# Patient Record
Sex: Male | Born: 1965 | Race: Black or African American | Hispanic: No | Marital: Married | Smoking: Former smoker
Health system: Southern US, Community
[De-identification: ages and names within clinical notes are randomized; demographics above are authoritative.]

## PROBLEM LIST (undated history)

## (undated) DIAGNOSIS — B3781 Candidal esophagitis: Secondary | ICD-10-CM

## (undated) DIAGNOSIS — G4730 Sleep apnea, unspecified: Secondary | ICD-10-CM

## (undated) DIAGNOSIS — K7460 Unspecified cirrhosis of liver: Secondary | ICD-10-CM

## (undated) DIAGNOSIS — E119 Type 2 diabetes mellitus without complications: Secondary | ICD-10-CM

## (undated) DIAGNOSIS — K219 Gastro-esophageal reflux disease without esophagitis: Secondary | ICD-10-CM

## (undated) DIAGNOSIS — Z973 Presence of spectacles and contact lenses: Secondary | ICD-10-CM

## (undated) DIAGNOSIS — I10 Essential (primary) hypertension: Secondary | ICD-10-CM

## (undated) DIAGNOSIS — T8859XA Other complications of anesthesia, initial encounter: Secondary | ICD-10-CM

## (undated) DIAGNOSIS — D696 Thrombocytopenia, unspecified: Secondary | ICD-10-CM

## (undated) DIAGNOSIS — K635 Polyp of colon: Secondary | ICD-10-CM

## (undated) HISTORY — DX: Candidal esophagitis: B37.81

## (undated) HISTORY — DX: Unspecified cirrhosis of liver: K74.60

## (undated) HISTORY — PX: BACK SURGERY: SHX140

## (undated) HISTORY — DX: Thrombocytopenia, unspecified: D69.6

## (undated) HISTORY — DX: Polyp of colon: K63.5

## (undated) HISTORY — PX: APPENDECTOMY: SHX54

## (undated) HISTORY — PX: COLONOSCOPY: SHX174

---

## 2000-10-25 ENCOUNTER — Emergency Department (HOSPITAL_COMMUNITY): Admission: EM | Admit: 2000-10-25 | Discharge: 2000-10-25 | Payer: Self-pay | Admitting: Emergency Medicine

## 2000-10-25 ENCOUNTER — Encounter: Payer: Self-pay | Admitting: Emergency Medicine

## 2000-11-03 ENCOUNTER — Encounter (HOSPITAL_COMMUNITY): Admission: RE | Admit: 2000-11-03 | Discharge: 2000-12-03 | Payer: Self-pay | Admitting: Internal Medicine

## 2000-11-17 ENCOUNTER — Ambulatory Visit (HOSPITAL_COMMUNITY): Admission: RE | Admit: 2000-11-17 | Discharge: 2000-11-17 | Payer: Self-pay | Admitting: General Surgery

## 2000-11-17 ENCOUNTER — Encounter: Payer: Self-pay | Admitting: General Surgery

## 2000-12-08 ENCOUNTER — Encounter (HOSPITAL_COMMUNITY): Admission: RE | Admit: 2000-12-08 | Discharge: 2001-01-07 | Payer: Self-pay | Admitting: Internal Medicine

## 2008-08-13 ENCOUNTER — Encounter (INDEPENDENT_AMBULATORY_CARE_PROVIDER_SITE_OTHER): Payer: Self-pay | Admitting: Orthopedic Surgery

## 2008-08-13 ENCOUNTER — Inpatient Hospital Stay (HOSPITAL_COMMUNITY): Admission: RE | Admit: 2008-08-13 | Discharge: 2008-08-15 | Payer: Self-pay | Admitting: Orthopedic Surgery

## 2009-09-26 ENCOUNTER — Emergency Department (HOSPITAL_COMMUNITY): Admission: EM | Admit: 2009-09-26 | Discharge: 2009-09-26 | Payer: Self-pay | Admitting: Emergency Medicine

## 2010-05-15 IMAGING — CR DG CHEST 2V
2 series · 2 of 2 positions shown · non-contrast
Comparison: None

CLINICAL DATA: Preop for surgery for ruptured lumbar disc

CHEST - 2 VIEW

[view not recorded (1 of 2)]
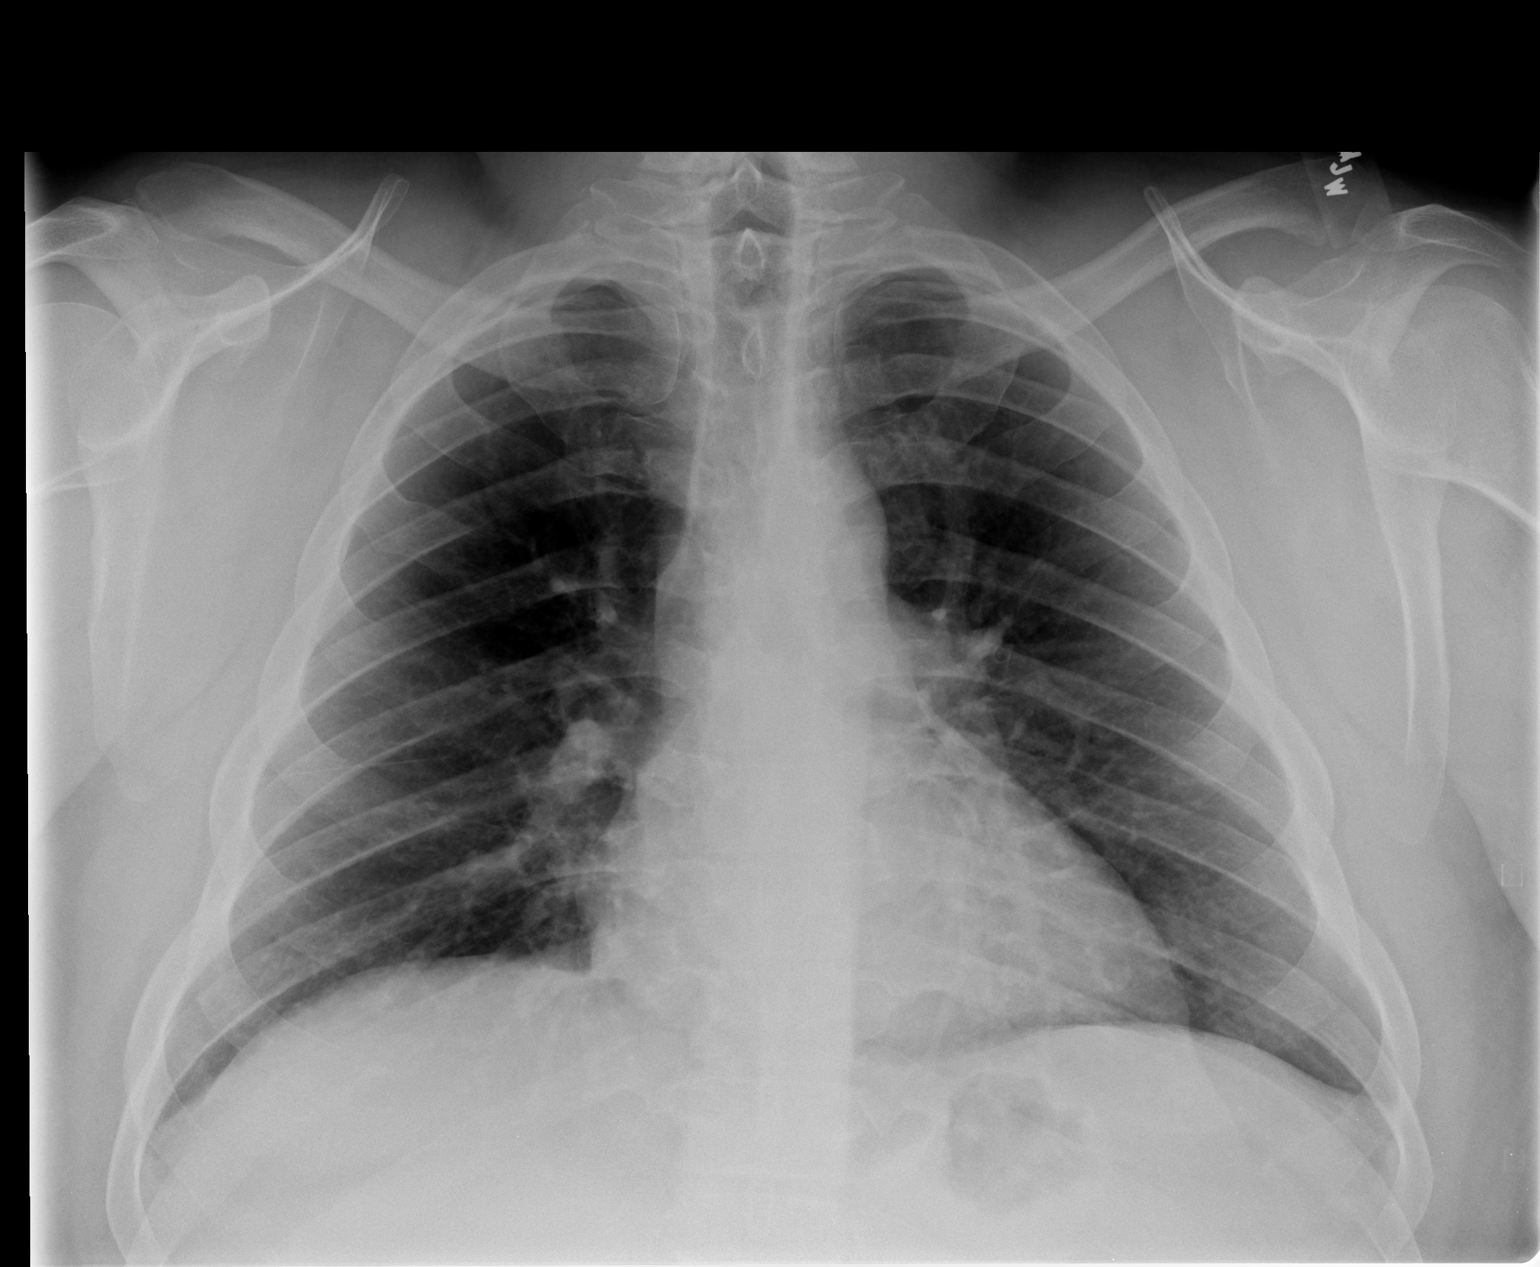

[view not recorded (2 of 2)]
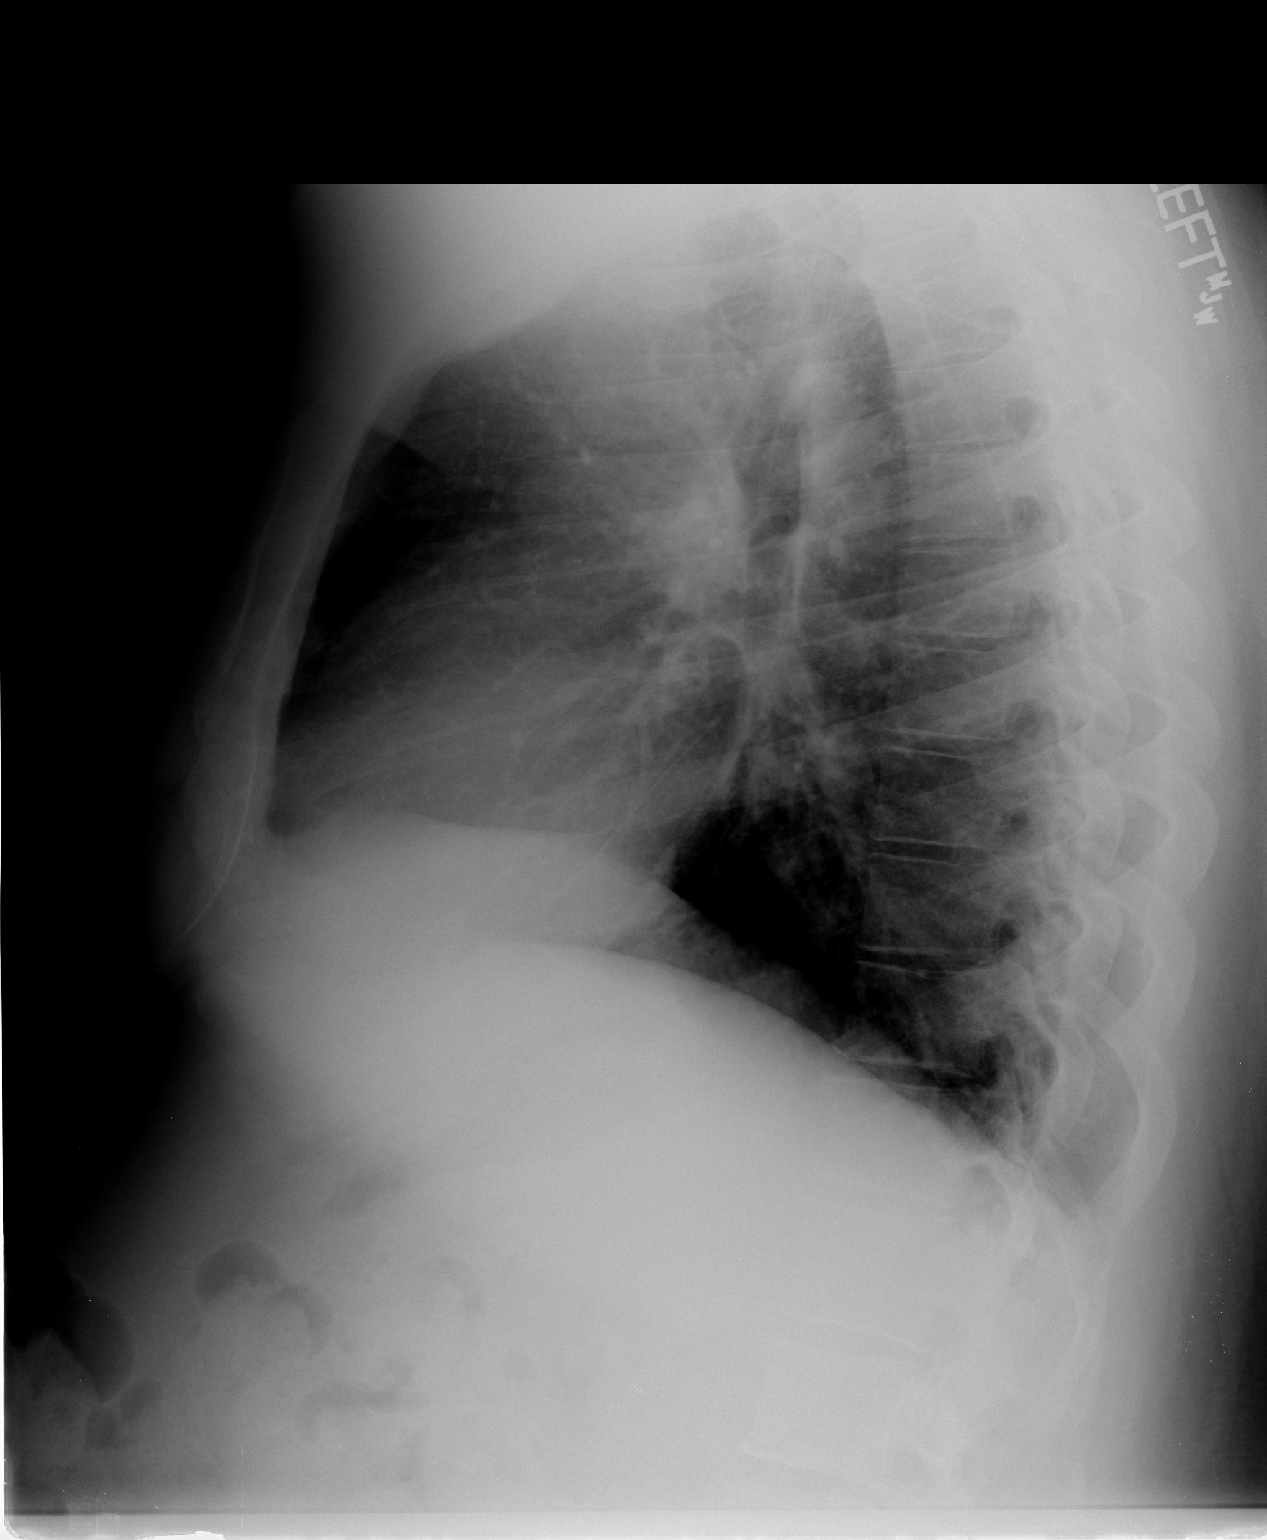

[2 of 2 positions shown; findings below may reference images not displayed]

FINDINGS: The lungs are clear. There is some peribronchial
thickening present.  The heart is borderline enlarged. No bony
abnormality is seen.
IMPRESSION: No active lung disease. Mild peribronchial thickening.

## 2010-07-20 LAB — URINALYSIS, ROUTINE W REFLEX MICROSCOPIC
Glucose, UA: >1000 mg/dL — AB
Hgb urine dipstick: NEGATIVE
Ketones, ur: 15 mg/dL — AB
Leukocytes, UA: NEGATIVE
Nitrite: NEGATIVE
Protein, ur: 30 mg/dL — AB
Specific Gravity, Urine: 1.041 — ABNORMAL HIGH (ref 1.005–1.030)
Urobilinogen, UA: 1 mg/dL (ref 0.0–1.0)
pH: 6 (ref 5.0–8.0)

## 2010-07-20 LAB — POCT I-STAT, CHEM 8
BUN: 9 mg/dL (ref 6–23)
Calcium, Ion: 1.24 mmol/L (ref 1.12–1.32)
Chloride: 104 mEq/L (ref 96–112)
Creatinine, Ser: 0.8 mg/dL (ref 0.4–1.5)
Glucose, Bld: 158 mg/dL — ABNORMAL HIGH (ref 70–99)
HCT: 39 % (ref 39.0–52.0)
Hemoglobin: 13.3 g/dL (ref 13.0–17.0)
Potassium: 4.7 mEq/L (ref 3.5–5.1)
Sodium: 140 mEq/L (ref 135–145)
TCO2: 27 mmol/L (ref 0–100)

## 2010-07-20 LAB — URINE MICROSCOPIC-ADD ON

## 2010-08-12 LAB — GLUCOSE, CAPILLARY
Glucose-Capillary: 130 mg/dL — ABNORMAL HIGH (ref 70–99)
Glucose-Capillary: 137 mg/dL — ABNORMAL HIGH (ref 70–99)
Glucose-Capillary: 156 mg/dL — ABNORMAL HIGH (ref 70–99)
Glucose-Capillary: 182 mg/dL — ABNORMAL HIGH (ref 70–99)
Glucose-Capillary: 185 mg/dL — ABNORMAL HIGH (ref 70–99)
Glucose-Capillary: 189 mg/dL — ABNORMAL HIGH (ref 70–99)
Glucose-Capillary: 192 mg/dL — ABNORMAL HIGH (ref 70–99)
Glucose-Capillary: 214 mg/dL — ABNORMAL HIGH (ref 70–99)
Glucose-Capillary: 242 mg/dL — ABNORMAL HIGH (ref 70–99)
Glucose-Capillary: 287 mg/dL — ABNORMAL HIGH (ref 70–99)

## 2010-08-12 LAB — COMPREHENSIVE METABOLIC PANEL
ALT: 27 U/L (ref 0–53)
AST: 34 U/L (ref 0–37)
Albumin: 3.7 g/dL (ref 3.5–5.2)
Alkaline Phosphatase: 46 U/L (ref 39–117)
BUN: 7 mg/dL (ref 6–23)
CO2: 26 mEq/L (ref 19–32)
Calcium: 10 mg/dL (ref 8.4–10.5)
Chloride: 103 mEq/L (ref 96–112)
Creatinine, Ser: 0.72 mg/dL (ref 0.4–1.5)
GFR calc Af Amer: >60 mL/min (ref >60)
GFR calc non Af Amer: >60 mL/min (ref >60)
Glucose, Bld: 145 mg/dL — ABNORMAL HIGH (ref 70–99)
Potassium: 4.1 mEq/L (ref 3.5–5.1)
Sodium: 137 mEq/L (ref 135–145)
Total Bilirubin: 1.2 mg/dL (ref 0.3–1.2)
Total Protein: 8.2 g/dL (ref 6.0–8.3)

## 2010-08-12 LAB — CBC
HCT: 33.3 % — ABNORMAL LOW (ref 39.0–52.0)
HCT: 41.4 % (ref 39.0–52.0)
Hemoglobin: 11.7 g/dL — ABNORMAL LOW (ref 13.0–17.0)
Hemoglobin: 14.3 g/dL (ref 13.0–17.0)
MCHC: 34.6 g/dL (ref 30.0–36.0)
MCHC: 35.1 g/dL (ref 30.0–36.0)
MCV: 91.2 fL (ref 78.0–100.0)
MCV: 92.4 fL (ref 78.0–100.0)
Platelets: 113 10*3/uL — ABNORMAL LOW (ref 150–400)
Platelets: 89 10*3/uL — ABNORMAL LOW (ref 150–400)
RBC: 3.65 MIL/uL — ABNORMAL LOW (ref 4.22–5.81)
RBC: 4.48 MIL/uL (ref 4.22–5.81)
RDW: 12.1 % (ref 11.5–15.5)
RDW: 12.7 % (ref 11.5–15.5)
WBC: 6.6 10*3/uL (ref 4.0–10.5)
WBC: 9.4 10*3/uL (ref 4.0–10.5)

## 2010-08-12 LAB — URINALYSIS, ROUTINE W REFLEX MICROSCOPIC
Bilirubin Urine: NEGATIVE
Glucose, UA: NEGATIVE mg/dL
Hgb urine dipstick: NEGATIVE
Ketones, ur: NEGATIVE mg/dL
Nitrite: NEGATIVE
Protein, ur: NEGATIVE mg/dL
Specific Gravity, Urine: 1.015 (ref 1.005–1.030)
Urobilinogen, UA: 2 mg/dL — ABNORMAL HIGH (ref 0.0–1.0)
pH: 6 (ref 5.0–8.0)

## 2010-08-12 LAB — HEMOGLOBIN A1C
Hgb A1c MFr Bld: 6.8 % — ABNORMAL HIGH (ref 4.6–6.1)
Mean Plasma Glucose: 148 mg/dL

## 2010-08-12 LAB — DIFFERENTIAL
Basophils Absolute: 0 10*3/uL (ref 0.0–0.1)
Basophils Relative: 0 % (ref 0–1)
Eosinophils Absolute: 0.3 10*3/uL (ref 0.0–0.7)
Eosinophils Relative: 4 % (ref 0–5)
Lymphocytes Relative: 15 % (ref 12–46)
Lymphs Abs: 1 10*3/uL (ref 0.7–4.0)
Monocytes Absolute: 0.6 10*3/uL (ref 0.1–1.0)
Monocytes Relative: 9 % (ref 3–12)
Neutro Abs: 4.8 10*3/uL (ref 1.7–7.7)
Neutrophils Relative %: 72 % (ref 43–77)

## 2010-08-12 LAB — BASIC METABOLIC PANEL
BUN: 10 mg/dL (ref 6–23)
CO2: 27 mEq/L (ref 19–32)
Calcium: 9.2 mg/dL (ref 8.4–10.5)
Chloride: 104 mEq/L (ref 96–112)
Creatinine, Ser: 1.03 mg/dL (ref 0.4–1.5)
GFR calc Af Amer: >60 mL/min (ref >60)
GFR calc non Af Amer: >60 mL/min (ref >60)
Glucose, Bld: 227 mg/dL — ABNORMAL HIGH (ref 70–99)
Potassium: 4.4 mEq/L (ref 3.5–5.1)
Sodium: 137 mEq/L (ref 135–145)

## 2010-08-12 LAB — VITAMIN D 25 HYDROXY (VIT D DEFICIENCY, FRACTURES): Vit D, 25-Hydroxy: 16 ng/mL — ABNORMAL LOW (ref 30–89)

## 2010-08-12 LAB — ABO/RH: ABO/RH(D): B POS

## 2010-08-12 LAB — TYPE AND SCREEN
ABO/RH(D): B POS
Antibody Screen: NEGATIVE

## 2010-08-12 LAB — PROTIME-INR
INR: 1.2 (ref 0.00–1.49)
Prothrombin Time: 15.2 seconds (ref 11.6–15.2)

## 2010-08-12 LAB — APTT: aPTT: 30 seconds (ref 24–37)

## 2010-09-15 NOTE — Discharge Summary (Signed)
Rivas, Jeffrey                 ACCOUNT NO.:  [id number]   MEDICAL RECORD NO.:  [id number]          PATIENT TYPE:  INP   LOCATION:  5015                         FACILITY:  [facility]   PHYSICIAN:  Nelda Severe, MD      DATE OF BIRTH:  Jul 12, 1965   DATE OF ADMISSION:  08/13/2008  DATE OF DISCHARGE:  08/15/2008                               DISCHARGE SUMMARY   DIAGNOSIS:  L5-S1 left-sided sciatica, disk herniation.   BRIEF HISTORY OF STAY:  The patient was brought in to [facility] on  August 13, 2008, by Dr. Nelda Severe for operative procedure of disk  excision of L5-S1 left side.  Postoperatively, the patient was stable.  There was minimal blood loss.  He was admitted to room 5015 and placed  on a sliding scale due to history of diabetes.  He was maintained on his  regular diabetic medication as well to include glyburide and Avandia.  Postop day 1, the patient was stable.  Drain was discontinued.  Dry  dressing was placed over the incision.  Hemoglobin 11.7 white blood  count 9.4, and glucose 227 treated with medication.  Distally  neurovascularly motor intact, lateral aspect and fifth toe on left foot  are numb and have been numb since preop.  We Hep-Locked his IV.  PT did  an evaluation for mobility.  On postop day 2, August 15, 2008, today, the  patient is stable, taking p.o.'s well, ambulating independently, and his  throat is sore, back is somewhat sore, but leg pain is better.   DIAGNOSIS:  Disk herniation, left side L5-S1 with sciatica.   PLAN:  The patient will be discharged home with care of his family.  We  are going to release him with Norco 10/325 one to two q.4 p.r.n. for  pain control, Robaxin 500 mg 1 q.6 p.r.n. for muscle spasms.  He is  going to follow up in our office and Dr. Nelda Severe in about 4 weeks  from surgery.  We want him to walk for exercise.  No bending, stooping,  or lifting.  Regular diet.   DISPOSITION:  Stable.      Lianne Cure,  P.A.      Nelda Severe, MD  Electronically Signed    MC/MEDQ  D:  08/15/2008  T:  08/16/2008  Job:  [id number]

## 2010-09-15 NOTE — H&P (Signed)
NAMELUTHER, [name]                 ACCOUNT NO.:  [id number]   MEDICAL RECORD NO.:  [id number]          PATIENT TYPE:  INP   LOCATION:  NA                           FACILITY:  [facility]   PHYSICIAN:  Lianne Cure, P.A.  DATE OF BIRTH:  01-Apr-1966   DATE OF ADMISSION:  DATE OF DISCHARGE:                              HISTORY & PHYSICAL   HISTORY OF PRESENT ILLNESS:  This is a 45 year old male who has a long-  standing history of lumbar pain, currently left leg pain with numbness  that spread to the toes.  He is a Emergency planning/management officer in [employer].  He has had  them at work because he cannot feel the bottom of his foot when he walks  or runs.   ALLERGIES:  He has no known drug allergies.   CURRENT MEDICATIONS:  1. Glyburide/metformin 5/100.  2. Norco 7.5/325, 6-7 daily.  3. Meloxicam 15 mg daily.  4. Lunesta p.r.n. for sleep q.h.s.  5. He also takes Avandia 4 mg p.o. daily.   PAST MEDICAL HISTORY:  1. Type 2 diabetes.  2. Appendectomy.   FAMILY HISTORY:  Diabetes, stroke, arthritis.   REVIEW OF SYSTEMS:  He reports no fever.  No chills.  No hemoptysis.  No  nausea, vomiting, or diarrhea.  He does have constipation.  No bowel or  bladder incontinence.   PHYSICAL EXAMINATION:  GENERAL:  He appears normocephalic.  EYES:  Pupils are equal, round, and reactive to light.  CHEST:  Clear to auscultation.  NECK:  Full active range of motion of the neck without pain.  HEART:  Regular rate and rhythm.  No murmur.  ABDOMEN:  Positive bowel sounds.  Soft, nontender to palpation.  EXTREMITIES:  He has 5-/5 on left gluteus maximus musculus with  decreased sensation, toes decreased sensation on the left.  He does have  a slight left antalgic gait.  SKIN:  Clean, dry.  No rashes noted at the incision site.   LABORATORY DATA:  MRI shows left-sided L5-S1 disk herniation.   PLAN:  Diskectomy, left-sided L5, S1.  Possible bilateral diskectomy at  L5, S1.      Lianne Cure, P.A.     MC/MEDQ  D:   08/09/2008  T:  08/10/2008  Job:  [id number]

## 2010-09-15 NOTE — Op Note (Signed)
[name], MCCONATHY NO.:  [id number]   MEDICAL RECORD NO.:  [id number]          PATIENT TYPE:  INP   LOCATION:  5015                         FACILITY:  [facility]   PHYSICIAN:  Nelda Severe, MD      DATE OF BIRTH:  Oct 05, 1965   DATE OF PROCEDURE:  08/13/2008  DATE OF DISCHARGE:                               OPERATIVE REPORT   SURGEON:  Nelda Severe, MD   ASSISTANT:  Lianne Cure, PA-C   PREOPERATIVE DIAGNOSES:  1. L5-S1 central and left-sided disk protrusion with left sciatic      pain.  2. Obesity.   POSTOPERATIVE DIAGNOSIS:  1. L5-S1 central and left-sided disk protrusion with left sciatic      pain.  2. Obesity.   OPERATIVE PROCEDURES:  1. Left L5-S1 laminotomy.  2. Disk excision L5-S1.   OPERATIVE NOTE:  The patient was placed under general endotracheal  anesthesia.  Prophylactic antibiotics were administered intravenously.  He was positioned prone on a Jackson frame.  Upper extremities were  positioned with care to avoid hyperflexion and abduction of the  shoulders and so as to avoid hyperflexion of the elbows.  The upper  extremities were padded with foam from axilla to hands.  The thighs,  knees, shins, ankles, and feet were supported on pillows.   A skin marker was used to mark the proposed site of the lumbosacral  incision and just to the right of midline.  Lumbar area was prepped with  DuraPrep and draped square.  The drapes were secured with Ioban.   At this point, a time-out was held.  The identity of the patient, the  preoperative diagnosis, and the intended procedure were confirmed as  well as the other parameters covered in the checklist.   The skin was scored in line with the skin marking.  The subcutaneous  layer was injected with a mixture of 0.25% plain Marcaine and 1%  lidocaine with epinephrine.  The incision was carried down through the  dermis and a thick layer of adipose tissue, about 2 inches, to the  thoracolumbar  fascia.  The thoracolumbar fascia was incised vertically  just to the left of midline.  The sacrospinalis muscle was then elevated  off the spinous process and lamina at L5-S1.  A cross-table lateral  radiograph was taken with a Kocher on the trailing edge of L5 and this  confirmed the location.   A Taylor retractor was placed lateral to the L5-S1 facet joint, left.  I  then used a high-speed cutting bur to perform a medial one-quarter  facetectomy at L5-S1 left and to remove some of the lamina more  proximally.  We then detached the ligamentum flavum from the upper edge  of the sacral lamina and started our laminectomy at this point.  The  lateral recess was decompressed.  Ligamentum flavum was ultimately  removed with a large Kerrison rongeur from distal to proximal.  The S1  nerve root was easily identified.  There was a hard knuckle of disk  protruding posteriorly and placing the root under significant tension.  I placed a cannula in the disk lateral to the disk protrusion and  inserted the Hydrocision nozzle into the disk space and enucleated the  disk without difficulty.  Cannula was then withdrawn.  None of the  protrusion had been sucked back into the disk space.  The S1 nerve root  origin was mobilized and retracted medially and held with a Love nerve  root retractor.  I then incised the protrusion through dense gritty  scarred annulus and underlying nucleus.  Removal of the disk protrusion  was extremely arduous, as it appeared to have been longstanding and very  scarred.  Towards the end of the decompression, I did manage to hook up  2 moderately-sized fragments of extruded nucleus pulposus from the canal  proximally.   The posterior longitudinal ligament, outer annulus, and underlying  scarred nucleus pulposus all had to be removed sharply by means of  curettes, #15 blade, Kerrison rongeurs, pituitary rongeurs, etc.  The  automated system did a very good job at enucleating  the disk.   At this point, the nerve appeared well decompressed.  We watched at the  disk space, and there were no remaining fragments.  I probed the floor  of the canal with a nerve hook and could not come up with any more free  fragments.  After further irrigation of wound, closure was effected over  a 50-gauge Blake drain brought out through the skin to the right side  and secured with a 2-0 nylon suture.  The thoracolumbar fascia was  closed using interrupted figure-of-eight #1 Vicryl sutures.  The  subcutaneous layer was closed using interrupted 2-0 inverted Vicryl  sutures, and the skin was closed with a running subcuticular 3-0 undyed  Vicryl suture.  The skin edges were reinforced with Steri-Strips.  Antibiotic ointment dressing was applied and secured with OpSite.   The blood loss was less than 100 mL.  There were no intraoperative  complications.  The patient is currently being awakened and subsequently  he will be transferred to the recovery room and has not yet been  examined awake.  However, he was stable throughout the procedure.  Sponge and needle counts were correct.      Nelda Severe, MD  Electronically Signed     MT/MEDQ  D:  08/13/2008  T:  08/14/2008  Job:  [id number]

## 2012-06-22 ENCOUNTER — Emergency Department (HOSPITAL_COMMUNITY): Payer: BC Managed Care – PPO

## 2012-06-22 ENCOUNTER — Encounter (HOSPITAL_COMMUNITY): Payer: Self-pay

## 2012-06-22 ENCOUNTER — Observation Stay (HOSPITAL_COMMUNITY)
Admission: EM | Admit: 2012-06-22 | Discharge: 2012-06-23 | Disposition: A | Discharge status: Home / Self Care | Payer: BC Managed Care – PPO | Attending: Internal Medicine | Admitting: Internal Medicine

## 2012-06-22 DIAGNOSIS — I152 Hypertension secondary to endocrine disorders: Secondary | ICD-10-CM | POA: Insufficient documentation

## 2012-06-22 DIAGNOSIS — M549 Dorsalgia, unspecified: Secondary | ICD-10-CM | POA: Insufficient documentation

## 2012-06-22 DIAGNOSIS — E1165 Type 2 diabetes mellitus with hyperglycemia: Secondary | ICD-10-CM

## 2012-06-22 DIAGNOSIS — G8929 Other chronic pain: Secondary | ICD-10-CM | POA: Insufficient documentation

## 2012-06-22 DIAGNOSIS — A059 Bacterial foodborne intoxication, unspecified: Principal | ICD-10-CM | POA: Insufficient documentation

## 2012-06-22 DIAGNOSIS — D72829 Elevated white blood cell count, unspecified: Secondary | ICD-10-CM | POA: Diagnosis present

## 2012-06-22 DIAGNOSIS — E1142 Type 2 diabetes mellitus with diabetic polyneuropathy: Secondary | ICD-10-CM

## 2012-06-22 DIAGNOSIS — R112 Nausea with vomiting, unspecified: Secondary | ICD-10-CM | POA: Diagnosis present

## 2012-06-22 DIAGNOSIS — E1159 Type 2 diabetes mellitus with other circulatory complications: Secondary | ICD-10-CM | POA: Insufficient documentation

## 2012-06-22 DIAGNOSIS — R109 Unspecified abdominal pain: Secondary | ICD-10-CM

## 2012-06-22 DIAGNOSIS — E1140 Type 2 diabetes mellitus with diabetic neuropathy, unspecified: Secondary | ICD-10-CM | POA: Diagnosis present

## 2012-06-22 DIAGNOSIS — E1149 Type 2 diabetes mellitus with other diabetic neurological complication: Secondary | ICD-10-CM | POA: Insufficient documentation

## 2012-06-22 DIAGNOSIS — IMO0002 Reserved for concepts with insufficient information to code with codable children: Secondary | ICD-10-CM

## 2012-06-22 DIAGNOSIS — R1013 Epigastric pain: Secondary | ICD-10-CM | POA: Diagnosis present

## 2012-06-22 DIAGNOSIS — E875 Hyperkalemia: Secondary | ICD-10-CM | POA: Diagnosis present

## 2012-06-22 DIAGNOSIS — I10 Essential (primary) hypertension: Secondary | ICD-10-CM | POA: Insufficient documentation

## 2012-06-22 DIAGNOSIS — R748 Abnormal levels of other serum enzymes: Secondary | ICD-10-CM

## 2012-06-22 DIAGNOSIS — K859 Acute pancreatitis without necrosis or infection, unspecified: Secondary | ICD-10-CM | POA: Diagnosis present

## 2012-06-22 HISTORY — DX: Essential (primary) hypertension: I10

## 2012-06-22 HISTORY — DX: Type 2 diabetes mellitus without complications: E11.9

## 2012-06-22 LAB — URINALYSIS, ROUTINE W REFLEX MICROSCOPIC
Glucose, UA: >1000 mg/dL — AB
Hgb urine dipstick: NEGATIVE
Ketones, ur: 15 mg/dL — AB
Leukocytes, UA: NEGATIVE
Nitrite: NEGATIVE
Protein, ur: 100 mg/dL — AB
Specific Gravity, Urine: 1.034 — ABNORMAL HIGH (ref 1.005–1.030)
Urobilinogen, UA: 2 mg/dL — ABNORMAL HIGH (ref 0.0–1.0)
pH: 5.5 (ref 5.0–8.0)

## 2012-06-22 LAB — URINE MICROSCOPIC-ADD ON

## 2012-06-22 LAB — POTASSIUM: Potassium: 4.3 mEq/L (ref 3.5–5.1)

## 2012-06-22 LAB — CBC
HCT: 40.3 % (ref 39.0–52.0)
HCT: 38.2 % — ABNORMAL LOW (ref 39.0–52.0)
Hemoglobin: 14.3 g/dL (ref 13.0–17.0)
Hemoglobin: 13.7 g/dL (ref 13.0–17.0)
MCH: 29.1 pg (ref 26.0–34.0)
MCH: 29.4 pg (ref 26.0–34.0)
MCHC: 35.5 g/dL (ref 30.0–36.0)
MCHC: 35.9 g/dL (ref 30.0–36.0)
MCV: 82.1 fL (ref 78.0–100.0)
MCV: 82 fL (ref 78.0–100.0)
Platelets: 139 10*3/uL — ABNORMAL LOW (ref 150–400)
Platelets: 120 10*3/uL — ABNORMAL LOW (ref 150–400)
RBC: 4.91 MIL/uL (ref 4.22–5.81)
RBC: 4.66 MIL/uL (ref 4.22–5.81)
RDW: 13.1 % (ref 11.5–15.5)
RDW: 13.1 % (ref 11.5–15.5)
WBC: 14.4 10*3/uL — ABNORMAL HIGH (ref 4.0–10.5)
WBC: 9.5 10*3/uL (ref 4.0–10.5)

## 2012-06-22 LAB — POCT I-STAT TROPONIN I: Troponin i, poc: 0 ng/mL (ref 0.00–0.08)

## 2012-06-22 LAB — BASIC METABOLIC PANEL
BUN: 16 mg/dL (ref 6–23)
CO2: 19 mEq/L (ref 19–32)
Calcium: 9.5 mg/dL (ref 8.4–10.5)
Chloride: 101 mEq/L (ref 96–112)
Creatinine, Ser: 1.17 mg/dL (ref 0.50–1.35)
GFR calc Af Amer: 86 mL/min — ABNORMAL LOW (ref >90)
GFR calc non Af Amer: 74 mL/min — ABNORMAL LOW (ref >90)
Glucose, Bld: 292 mg/dL — ABNORMAL HIGH (ref 70–99)
Potassium: 5.8 mEq/L — ABNORMAL HIGH (ref 3.5–5.1)
Sodium: 134 mEq/L — ABNORMAL LOW (ref 135–145)

## 2012-06-22 LAB — HEPATIC FUNCTION PANEL
ALT: 52 U/L (ref 0–53)
AST: 56 U/L — ABNORMAL HIGH (ref 0–37)
Albumin: 3.7 g/dL (ref 3.5–5.2)
Alkaline Phosphatase: 111 U/L (ref 39–117)
Bilirubin, Direct: 0.4 mg/dL — ABNORMAL HIGH (ref 0.0–0.3)
Indirect Bilirubin: 0.7 mg/dL (ref 0.3–0.9)
Total Bilirubin: 1.1 mg/dL (ref 0.3–1.2)
Total Protein: 8.9 g/dL — ABNORMAL HIGH (ref 6.0–8.3)

## 2012-06-22 LAB — CREATININE, SERUM
Creatinine, Ser: 0.96 mg/dL (ref 0.50–1.35)
GFR calc Af Amer: >90 mL/min (ref >90)
GFR calc non Af Amer: >90 mL/min (ref >90)

## 2012-06-22 LAB — POCT I-STAT, CHEM 8
BUN: 17 mg/dL (ref 6–23)
Calcium, Ion: 1.21 mmol/L (ref 1.12–1.23)
Chloride: 107 mEq/L (ref 96–112)
Creatinine, Ser: 1.3 mg/dL (ref 0.50–1.35)
Glucose, Bld: 290 mg/dL — ABNORMAL HIGH (ref 70–99)
HCT: 43 % (ref 39.0–52.0)
Hemoglobin: 14.6 g/dL (ref 13.0–17.0)
Potassium: 5.9 mEq/L — ABNORMAL HIGH (ref 3.5–5.1)
Sodium: 139 mEq/L (ref 135–145)
TCO2: 26 mmol/L (ref 0–100)

## 2012-06-22 LAB — GLUCOSE, CAPILLARY: Glucose-Capillary: 178 mg/dL — ABNORMAL HIGH (ref 70–99)

## 2012-06-22 LAB — LIPASE, BLOOD: Lipase: 113 U/L — ABNORMAL HIGH (ref 11–59)

## 2012-06-22 MED ORDER — INSULIN ASPART 100 UNIT/ML ~~LOC~~ SOLN
0.0000 [IU] | Freq: Three times a day (TID) | SUBCUTANEOUS | Status: DC
  Administered 2012-06-23: 1 [IU] via SUBCUTANEOUS
  Administered 2012-06-23: 3 [IU] via SUBCUTANEOUS

## 2012-06-22 MED ORDER — INSULIN ASPART 100 UNIT/ML ~~LOC~~ SOLN: 0.0000 [IU] | Freq: Every day | SUBCUTANEOUS | Status: DC

## 2012-06-22 MED ORDER — ZOLPIDEM TARTRATE 5 MG PO TABS: 5.0000 mg | ORAL_TABLET | Freq: Every evening | ORAL | Status: DC | PRN

## 2012-06-22 MED ORDER — SODIUM POLYSTYRENE SULFONATE 15 GM/60ML PO SUSP
30.0000 g | Freq: Once | ORAL | Status: DC
  Filled 2012-06-22: qty 60

## 2012-06-22 MED ORDER — PREGABALIN 75 MG PO CAPS
75.0000 mg | ORAL_CAPSULE | Freq: Two times a day (BID) | ORAL | Status: DC
  Administered 2012-06-22 – 2012-06-23 (×2): 75 mg via ORAL
  Filled 2012-06-22 (×2): qty 1

## 2012-06-22 MED ORDER — LOSARTAN POTASSIUM 50 MG PO TABS
50.0000 mg | ORAL_TABLET | Freq: Every day | ORAL | Status: DC
  Administered 2012-06-23: 50 mg via ORAL
  Filled 2012-06-22: qty 1

## 2012-06-22 MED ORDER — HYDROCHLOROTHIAZIDE 12.5 MG PO CAPS
12.5000 mg | ORAL_CAPSULE | Freq: Every day | ORAL | Status: DC
  Administered 2012-06-23: 12.5 mg via ORAL
  Filled 2012-06-22: qty 1

## 2012-06-22 MED ORDER — PANTOPRAZOLE SODIUM 40 MG PO TBEC: 40.0000 mg | DELAYED_RELEASE_TABLET | Freq: Every day | ORAL | Status: DC

## 2012-06-22 MED ORDER — HYDROCODONE-ACETAMINOPHEN 5-325 MG PO TABS
1.0000 | ORAL_TABLET | Freq: Four times a day (QID) | ORAL | Status: DC | PRN
  Administered 2012-06-22: 1 via ORAL
  Filled 2012-06-22: qty 1

## 2012-06-22 MED ORDER — METOCLOPRAMIDE HCL 5 MG/ML IJ SOLN
5.0000 mg | Freq: Three times a day (TID) | INTRAMUSCULAR | Status: DC
  Filled 2012-06-22 (×2): qty 1

## 2012-06-22 MED ORDER — ONDANSETRON HCL 4 MG/2ML IJ SOLN
4.0000 mg | Freq: Once | INTRAMUSCULAR | Status: AC
  Administered 2012-06-22: 4 mg via INTRAVENOUS
  Filled 2012-06-22: qty 2

## 2012-06-22 MED ORDER — SODIUM CHLORIDE 0.9 % IV BOLUS (SEPSIS)
1000.0000 mL | Freq: Once | INTRAVENOUS | Status: AC
  Administered 2012-06-22: 1000 mL via INTRAVENOUS

## 2012-06-22 MED ORDER — SODIUM CHLORIDE 0.9 % IV SOLN
INTRAVENOUS | Status: AC
  Administered 2012-06-22 – 2012-06-23 (×2): via INTRAVENOUS

## 2012-06-22 MED ORDER — HYDROMORPHONE HCL PF 1 MG/ML IJ SOLN
1.0000 mg | INTRAMUSCULAR | Status: DC | PRN
  Administered 2012-06-22 – 2012-06-23 (×4): 1 mg via INTRAVENOUS
  Filled 2012-06-22 (×4): qty 1

## 2012-06-22 MED ORDER — HYDROMORPHONE HCL PF 1 MG/ML IJ SOLN
1.0000 mg | Freq: Once | INTRAMUSCULAR | Status: AC
  Administered 2012-06-22: 1 mg via INTRAVENOUS
  Filled 2012-06-22: qty 1

## 2012-06-22 MED ORDER — PANTOPRAZOLE SODIUM 40 MG PO TBEC
40.0000 mg | DELAYED_RELEASE_TABLET | Freq: Every day | ORAL | Status: DC
  Administered 2012-06-23: 40 mg via ORAL
  Filled 2012-06-22: qty 1

## 2012-06-22 MED ORDER — ENOXAPARIN SODIUM 40 MG/0.4ML ~~LOC~~ SOLN
40.0000 mg | SUBCUTANEOUS | Status: DC
  Administered 2012-06-22: 40 mg via SUBCUTANEOUS
  Filled 2012-06-22 (×2): qty 0.4

## 2012-06-22 MED ORDER — IOHEXOL 300 MG/ML  SOLN
25.0000 mL | INTRAMUSCULAR | Status: AC
  Administered 2012-06-22: 25 mL via ORAL

## 2012-06-22 MED ORDER — IOHEXOL 300 MG/ML  SOLN
100.0000 mL | Freq: Once | INTRAMUSCULAR | Status: AC | PRN
  Administered 2012-06-22: 100 mL via INTRAVENOUS

## 2012-06-22 MED ORDER — LOSARTAN POTASSIUM-HCTZ 50-12.5 MG PO TABS: 1.0000 | ORAL_TABLET | Freq: Every day | ORAL | Status: DC

## 2012-06-22 NOTE — Progress Notes (Signed)
GURSHAAN MATSUOKA [id number] Admission Data: 06/22/2012 7:07 PM Attending Provider: Kathlen Mody, MD  WUJ:WJXBJYN,WGNFAOZH R., MD Consults/ Treatment Team:    DEZMEN ALCOCK is a 47 y.o. male patient admitted from ED awake, alert  & orientated  X 3, c/o epigastric pain n/v. From home with wife  Full Code, VSS - Blood pressure 128/65, pulse 95, temperature 98.5 F (36.9 C), temperature source Oral, resp. rate 20, height 5\' 11"  (1.803 m), weight 120.203 kg (265 lb), SpO2 99.00%., , no c/o shortness of breath, no c/o chest pain, no distress noted.   IV site WDL:  antecubital left, condition patent and no redness with a transparent dsg that's clean dry and intact.  Allergies:  No Known Allergies   Past Medical History  Diagnosis Date  . Hypertension   . Diabetes mellitus without complication     History:  obtained from the patient.  Pt orientation to unit, room and routine. Information packet given to patient/family and safety video watched.  Admission INP armband ID verified with patient/family, and in place. SR up x 2, fall risk assessment complete with Patient and family verbalizing understanding of risks associated with falls. Pt verbalizes an understanding of how to use the call bell and to call for help before getting out of bed.  Skin, clean-dry- intact without evidence of bruising, or skin tears.   No evidence of skin break down noted on exam.    Will cont to monitor and assist as needed.  Cindra Eves, RN 06/22/2012 7:07 PM

## 2012-06-22 NOTE — ED Notes (Signed)
STOMACH PAIN STARTED THIS A.M. ABOUT 0830. EMESIS STARTED AROUND SAME TIME. STOMACH PAIN IS HIGH- FEELS LIKE 'GAS'. LAST TIME ATE WAS LAST NIGHT. HAD [employer] FOR DINNER. DID NOT CHECK BLOOD SUGAR LAST NIGHT.

## 2012-06-22 NOTE — ED Provider Notes (Signed)
History    CSN: [id number]  Arrival date & time 06/22/12  1253   First MD Initiated Contact with Patient 06/22/12 1333      Chief Complaint  Patient presents with  . Abdominal Pain  . Nausea  . Emesis    HPI Pt is a 47 yo M with PMH of DM presenting with acute onset abd pain, nausea, emesis at 0830 this morning. He developed diarrhea recently as well. He also endorses generalized abd pain, radiating to flanks bilaterally. He denies fevers, recent travel or sick contacts. He states he had a pickled red hot weenie for lunch yesterday, and [employer] for dinner, but he family ate that as well and nobody else is ill. He is not able to tolerate any PO at this time.   History reviewed. No pertinent past medical history.  Past Surgical History  Procedure Laterality Date  . Appendectomy    . Back surgery      No family history on file.  History  Substance Use Topics  . Smoking status: Former Smoker    Quit date: 06/23/1995  . Smokeless tobacco: Not on file  . Alcohol Use: 0.6 oz/week    1 Cans of beer per week    Review of Systems  Constitutional: Negative for fever and chills.  HENT: Negative for congestion.   Respiratory: Negative for cough, chest tightness and shortness of breath.   Cardiovascular: Negative for chest pain.  Gastrointestinal: Positive for nausea, vomiting, abdominal pain, diarrhea and abdominal distention. Negative for blood in stool.  Genitourinary: Negative for difficulty urinating.  Skin: Negative for rash.  All other systems reviewed and are negative.    Allergies  Review of patient's allergies indicates no known allergies.  Home Medications   Current Outpatient Rx  Name  Route  Sig  Dispense  Refill  . Eszopiclone (ESZOPICLONE) 3 MG TABS   Oral   Take 3 mg by mouth at bedtime. Take immediately before bedtime         . glyBURIDE-metformin (GLUCOVANCE) 5-500 MG per tablet   Oral   Take 2 tablets by mouth 2 (two) times daily.         Marland Kitchen  HYDROcodone-acetaminophen (NORCO/VICODIN) 5-325 MG per tablet   Oral   Take 1 tablet by mouth every 6 (six) hours as needed for pain.         Marland Kitchen lansoprazole (PREVACID) 15 MG capsule   Oral   Take 15 mg by mouth daily.         Marland Kitchen losartan-hydrochlorothiazide (HYZAAR) 50-12.5 MG per tablet   Oral   Take 1 tablet by mouth daily.           BP 120/61  Pulse 90  Temp(Src) 98.7 F (37.1 C) (Oral)  Resp 19  Ht 5\' 11"  (1.803 m)  Wt 265 lb (120.203 kg)  BMI 36.98 kg/m2  SpO2 98%  Physical Exam  Constitutional: He is oriented to person, place, and time. He appears well-developed and well-nourished. He has a sickly appearance.  HENT:  Head: Normocephalic and atraumatic.  Mouth/Throat: Mucous membranes are dry. No posterior oropharyngeal erythema.  Eyes: Pupils are equal, round, and reactive to light.  Neck: Normal range of motion.  Cardiovascular: Normal rate, regular rhythm and normal heart sounds.   Pulmonary/Chest: Effort normal and breath sounds normal. He has no wheezes.  Abdominal: Soft. Bowel sounds are normal. There is tenderness (Generalized tenderness, most tender in epigastric area). There is no rebound and no guarding.  Musculoskeletal:  He exhibits no edema and no tenderness.  Lymphadenopathy:    He has no cervical adenopathy.  Neurological: He is alert and oriented to person, place, and time. No cranial nerve deficit. He exhibits normal muscle tone. Coordination normal.  Skin: Skin is warm. No rash noted.    ED Course  Procedures (including critical care time)  Labs Reviewed  CBC - Abnormal; Notable for the following:    WBC 14.4 (*)    Platelets 139 (*)    All other components within normal limits  LIPASE, BLOOD - Abnormal; Notable for the following:    Lipase 113 (*)    All other components within normal limits  URINALYSIS, ROUTINE W REFLEX MICROSCOPIC - Abnormal; Notable for the following:    Color, Urine AMBER (*)    Specific Gravity, Urine 1.034 (*)     Glucose, UA >1000 (*)    Bilirubin Urine SMALL (*)    Ketones, ur 15 (*)    Protein, ur 100 (*)    Urobilinogen, UA 2.0 (*)    All other components within normal limits  HEPATIC FUNCTION PANEL - Abnormal; Notable for the following:    Total Protein 8.9 (*)    AST 56 (*)    Bilirubin, Direct 0.4 (*)    All other components within normal limits  URINE MICROSCOPIC-ADD ON - Abnormal; Notable for the following:    Bacteria, UA FEW (*)    Casts HYALINE CASTS (*)    All other components within normal limits  BASIC METABOLIC PANEL - Abnormal; Notable for the following:    Sodium 134 (*)    Potassium 5.8 (*)    Glucose, Bld 292 (*)    GFR calc non Af Amer 74 (*)    GFR calc Af Amer 86 (*)    All other components within normal limits  POCT I-STAT, CHEM 8 - Abnormal; Notable for the following:    Potassium 5.9 (*)    Glucose, Bld 290 (*)    All other components within normal limits  POCT I-STAT TROPONIN I   Ct Abdomen Pelvis W Contrast  06/22/2012  *RADIOLOGY REPORT*  Clinical Data: Abdominal pain.  Nausea.  Emesis.  CT ABDOMEN AND PELVIS WITH CONTRAST  Technique:  Multidetector CT imaging of the abdomen and pelvis was performed following the standard protocol during bolus administration of intravenous contrast.  Contrast: OMNIPAQUE IOHEXOL 300 MG/ML  SOLN  Comparison: None.  Findings: Mild dependent atelectasis is present bilaterally, worse on the left.  Heart size is normal.  No significant pleural or pericardial effusion is present.  Mild diffuse fatty infiltration is suggested. There is irregularity along the left lobe of the, compatible with cirrhosis.  The portal vein is enlarged.  Varices are noted. The umbilical vein is recannulated.  There is mild enlargement of the spleen.  The stomach, duodenum, and pancreas are within normal limits.  The common bile duct and gallbladder are normal.  The adrenal glands are normal bilaterally.  The kidneys are unremarkable. Mildly enlarged  retroperitoneal lymph nodes are likely reactive. Atherosclerotic calcifications are present in the aorta without aneurysm.  The rectosigmoid colon is within normal limits.  The remainder the colon is unremarkable.  The appendix is visualized and within normal limits.  The small bowel is unremarkable.  The L5-S1 disc spaces fused.  Prominent to endplate osteophytes and facet degenerative change contribute to severe foraminal stenosis bilaterally.  More mild degenerative changes are evident at L3-4 L4- 5.  No focal lytic  or blastic lesions are present in the axial skeleton.  IMPRESSION:  1.  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 2.  No discrete hepatic lesion. 3.  No other acute or focal abnormality to explain the patient's symptoms. 4.  Severe spondylosis at L5-S1.   Original Report Authenticated By: Marin Roberts, M.D.      1. Abdominal pain   2. Hyperkalemia   3. Elevated lipase      MDM  Pt is a 47 yo M with acute onset N/V/D and abd pain  Given his co-morbidities, will check CBC, Bmet, LFT, lipase. Give fluids, Zofran and Dilaudid for symptoms.  1530- Labs reviewed. K+ 5.9 on iStat. Will verify with Bmet. Lab will pull blood in lab and add on. CT abd/pelvis pending. Continue IVF and Zofran as needed. 1600- Bmet shows K+ of 5.8. Pt currently at CT. Will get EKG on return to ED, and then give kayexalate for hyperkalemia.  1653- Pt feeling much better, but still having abdominal pain. CT scan does not reveal cause for abdominal pain. Given his hyperkalemia requiring treatment, Creat greater than baseline and elevated lipase will call [facility] for admission. Pt agrees with this plan. Will admit to [facility].  Hilarie Fredrickson, MD 06/22/12 [phone]

## 2012-06-22 NOTE — ED Provider Notes (Addendum)
Shelda Jakes, MD  I saw and evaluated the patient, reviewed the resident's note and I agree with the findings and plan.  Patient with acute onset of vomiting around 8:30 this morning. Has vomited approximately 10 times has had one loose bowel movement here. No blood in either one. Patient is also complaining of bilateral abdominal pain that radiates around to the back he says is fairly severe. Patient has history of hiatal hernia only abdominal surgeries and appendectomy. Clinically I feel it is probably a gastroenteritis however since the patient's abdominal pain is so severe we'll go ahead and get a CT abdomen to further evaluate also recommend liver function tests and lipase labs be ordered.  Shelda Jakes, MD 06/22/12 1436   Results for orders placed during the hospital encounter of 06/22/12  CBC      Result Value Range   WBC 14.4 (*) 4.0 - 10.5 K/uL   RBC 4.91  4.22 - 5.81 MIL/uL   Hemoglobin 14.3  13.0 - 17.0 g/dL   HCT 16.1  09.6 - 04.5 %   MCV 82.1  78.0 - 100.0 fL   MCH 29.1  26.0 - 34.0 pg   MCHC 35.5  30.0 - 36.0 g/dL   RDW 40.9  81.1 - 91.4 %   Platelets 139 (*) 150 - 400 K/uL  LIPASE, BLOOD      Result Value Range   Lipase 113 (*) 11 - 59 U/L  URINALYSIS, ROUTINE W REFLEX MICROSCOPIC      Result Value Range   Color, Urine AMBER (*) YELLOW   APPearance CLEAR  CLEAR   Specific Gravity, Urine 1.034 (*) 1.005 - 1.030   pH 5.5  5.0 - 8.0   Glucose, UA >1000 (*) NEGATIVE mg/dL   Hgb urine dipstick NEGATIVE  NEGATIVE   Bilirubin Urine SMALL (*) NEGATIVE   Ketones, ur 15 (*) NEGATIVE mg/dL   Protein, ur 782 (*) NEGATIVE mg/dL   Urobilinogen, UA 2.0 (*) 0.0 - 1.0 mg/dL   Nitrite NEGATIVE  NEGATIVE   Leukocytes, UA NEGATIVE  NEGATIVE  HEPATIC FUNCTION PANEL      Result Value Range   Total Protein 8.9 (*) 6.0 - 8.3 g/dL   Albumin 3.7  3.5 - 5.2 g/dL   AST 56 (*) 0 - 37 U/L   ALT 52  0 - 53 U/L   Alkaline Phosphatase 111  39 - 117 U/L   Total Bilirubin 1.1   0.3 - 1.2 mg/dL   Bilirubin, Direct 0.4 (*) 0.0 - 0.3 mg/dL   Indirect Bilirubin 0.7  0.3 - 0.9 mg/dL  URINE MICROSCOPIC-ADD ON      Result Value Range   Squamous Epithelial / LPF RARE  RARE   WBC, UA 0-2  <3 WBC/hpf   RBC / HPF 0-2  <3 RBC/hpf   Bacteria, UA FEW (*) RARE   Casts HYALINE CASTS (*) NEGATIVE  BASIC METABOLIC PANEL      Result Value Range   Sodium 134 (*) 135 - 145 mEq/L   Potassium 5.8 (*) 3.5 - 5.1 mEq/L   Chloride 101  96 - 112 mEq/L   CO2 19  19 - 32 mEq/L   Glucose, Bld 292 (*) 70 - 99 mg/dL   BUN 16  6 - 23 mg/dL   Creatinine, Ser 9.56  0.50 - 1.35 mg/dL   Calcium 9.5  8.4 - 21.3 mg/dL   GFR calc non Af Amer 74 (*) >90 mL/min   GFR calc Af  Amer 86 (*) >90 mL/min  POCT I-STAT, CHEM 8      Result Value Range   Sodium 139  135 - 145 mEq/L   Potassium 5.9 (*) 3.5 - 5.1 mEq/L   Chloride 107  96 - 112 mEq/L   BUN 17  6 - 23 mg/dL   Creatinine, Ser 5.95  0.50 - 1.35 mg/dL   Glucose, Bld 638 (*) 70 - 99 mg/dL   Calcium, Ion 7.56  4.33 - 1.23 mmol/L   TCO2 26  0 - 100 mmol/L   Hemoglobin 14.6  13.0 - 17.0 g/dL   HCT 29.5  18.8 - 41.6 %  POCT I-STAT TROPONIN I      Result Value Range   Troponin i, poc 0.00  0.00 - 0.08 ng/mL   Comment 3             Patient's workup negative with the exception of the persistent cyst in the hyperkalemia. We'll treat with Kayexalate unfortunately will require admission for that cannot be discharged home. Patient's EKG has no acute hyperkalemia findings onto this may have been gradual. Suspect that the abdominal pain nausea and vomiting could be viral gastroenteritis late related. However patient's lipase is elevated at 113 so could be some mild pancreatitis as well.   Date: 06/22/2012  Rate: 90  Rhythm: normal sinus rhythm  QRS Axis: normal  Intervals: normal  ST/T Wave abnormalities: normal  Conduction Disutrbances:none  Narrative Interpretation:   Old EKG Reviewed: none available    Shelda Jakes, MD 06/22/12  [phone]

## 2012-06-22 NOTE — H&P (Signed)
[facility] History and Physical  OHM DENTLER ZOX:[id number] DOB: 1965-07-24 DOA: 06/22/2012   PCP: Alva Garnet., MD    Chief Complaint: abdominal pain.   HPI: [name] is a 47 y.o. male with h/o DM for > 20 years, hypertension,  Chronic back pain uses 2gm of ibuprofen for neuropathy and back pain comes in for nausea , vomiting, abd pain and one episode of watery diarrhea since one day. No fever or chills. On arrival he was found to have mild elevation of lipase and hyperkalemia. He refused kayexalate in ED, as he had a watery bowel movement. His EKG did not show peaked T waves. No chest pain or sob. His CT abd showed cirrhosis with splenomegaly. He reports he takes alcohol occasionally. He is being Lexicographer service for further evaluation.   Review of Systems: The patient denies anorexia, fever, weight loss,, vision loss, decreased hearing, hoarseness, chest pain, syncope, dyspnea on exertion, peripheral edema, balance deficits, hemoptysis, melena, hematochezia, severe indigestion/heartburn, hematuria, incontinence, genital sores, muscle weakness, suspicious skin lesions, transient blindness, difficulty walking, depression, unusual weight change, abnormal bleeding, enlarged lymph nodes, angioedema, and breast masses.    Past Medical History  Diagnosis Date  . Hypertension   . Diabetes mellitus without complication    Past Surgical History  Procedure Laterality Date  . Appendectomy    . Back surgery     Social History:  reports that he quit smoking about 17 years ago. He does not have any smokeless tobacco history on file. He reports that he drinks about 0.6 ounces of alcohol per week. His drug history is not on file.  where does patient live--home, No Known Allergies  No family history on file.  Prior to Admission medications   Medication Sig Start Date End Date Taking? Authorizing Provider  Eszopiclone (ESZOPICLONE) 3 MG TABS Take 3 mg by mouth at  bedtime. Take immediately before bedtime   Yes Historical Provider, MD  glyBURIDE-metformin (GLUCOVANCE) 5-500 MG per tablet Take 2 tablets by mouth 2 (two) times daily.   Yes Historical Provider, MD  HYDROcodone-acetaminophen (NORCO/VICODIN) 5-325 MG per tablet Take 1 tablet by mouth every 6 (six) hours as needed for pain.   Yes Historical Provider, MD  lansoprazole (PREVACID) 15 MG capsule Take 15 mg by mouth daily.   Yes Historical Provider, MD  losartan-hydrochlorothiazide (HYZAAR) 50-12.5 MG per tablet Take 1 tablet by mouth daily.   Yes Historical Provider, MD   Physical Exam: Filed Vitals:   06/22/12 1250 06/22/12 1300 06/22/12 1638  BP: 111/56  120/61  Pulse: 99  90  Temp: 98.7 F (37.1 C)    TempSrc: Oral    Resp: 23  19  Height:  5\' 11"  (1.803 m)   Weight:  120.203 kg (265 lb)   SpO2: 100%  98%   Constitutional: Vital signs reviewed.  Patient is a well-developed and well-nourished  in no acute distress and cooperative with exam. Alert and oriented x3.  Head: Normocephalic and atraumatic Mouth: no erythema or exudates, MMM Eyes: PERRL, EOMI, conjunctivae normal, No scleral icterus.  Neck: Supple, Trachea midline normal ROM, No JVD, mass, thyromegaly, or carotid bruit present.  Cardiovascular: RRR, S1 normal, S2 normal, no MRG, pulses symmetric and intact bilaterally Pulmonary/Chest: CTAB, no wheezes, rales, or rhonchi Abdominal: Soft. Tender in the epigastric area,  non-distended, bowel sounds are normal,  Musculoskeletal: No joint deformities, erythema, or stiffness, ROM full and no nontender Neurological: A&O x3, Strength is normal and symmetric bilaterally,  cranial nerve II-XII are grossly intact, no focal motor deficit, sensory intact to light touch bilaterally.  Skin: Warm, dry and intact. No rash, cyanosis, or clubbing.      Labs on Admission:  Basic Metabolic Panel:  Recent Labs Lab 06/22/12 1455 06/22/12 1511  NA 134* 139  K 5.8* 5.9*  CL 101 107  CO2 19   --   GLUCOSE 292* 290*  BUN 16 17  CREATININE 1.17 1.30  CALCIUM 9.5  --    Liver Function Tests:  Recent Labs Lab 06/22/12 1455  AST 56*  ALT 52  ALKPHOS 111  BILITOT 1.1  PROT 8.9*  ALBUMIN 3.7    Recent Labs Lab 06/22/12 1455  LIPASE 113*   No results found for this basename: AMMONIA,  in the last 168 hours CBC:  Recent Labs Lab 06/22/12 1455 06/22/12 1511  WBC 14.4*  --   HGB 14.3 14.6  HCT 40.3 43.0  MCV 82.1  --   PLT 139*  --    Cardiac Enzymes: No results found for this basename: CKTOTAL, CKMB, CKMBINDEX, TROPONINI,  in the last 168 hours  BNP (last 3 results) No results found for this basename: PROBNP,  in the last 8760 hours CBG: No results found for this basename: GLUCAP,  in the last 168 hours  Radiological Exams on Admission: Ct Abdomen Pelvis W Contrast  06/22/2012  *RADIOLOGY REPORT*  Clinical Data: Abdominal pain.  Nausea.  Emesis.  CT ABDOMEN AND PELVIS WITH CONTRAST  Technique:  Multidetector CT imaging of the abdomen and pelvis was performed following the standard protocol during bolus administration of intravenous contrast.  Contrast: OMNIPAQUE IOHEXOL 300 MG/ML  SOLN  Comparison: None.  Findings: Mild dependent atelectasis is present bilaterally, worse on the left.  Heart size is normal.  No significant pleural or pericardial effusion is present.  Mild diffuse fatty infiltration is suggested. There is irregularity along the left lobe of the, compatible with cirrhosis.  The portal vein is enlarged.  Varices are noted. The umbilical vein is recannulated.  There is mild enlargement of the spleen.  The stomach, duodenum, and pancreas are within normal limits.  The common bile duct and gallbladder are normal.  The adrenal glands are normal bilaterally.  The kidneys are unremarkable. Mildly enlarged retroperitoneal lymph nodes are likely reactive. Atherosclerotic calcifications are present in the aorta without aneurysm.  The rectosigmoid colon is  within normal limits.  The remainder the colon is unremarkable.  The appendix is visualized and within normal limits.  The small bowel is unremarkable.  The L5-S1 disc spaces fused.  Prominent to endplate osteophytes and facet degenerative change contribute to severe foraminal stenosis bilaterally.  More mild degenerative changes are evident at L3-4 L4- 5.  No focal lytic or blastic lesions are present in the axial skeleton.  IMPRESSION:  1.  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 2.  No discrete hepatic lesion. 3.  No other acute or focal abnormality to explain the patient's symptoms. 4.  Severe spondylosis at L5-S1.   Original Report Authenticated By: Marin Roberts, M.D.     EKG: NSR  Assessment/Plan Active Problems:   1.Nausea vomiting and abdominal pain probably from  Mild pancreatitis: differential include gastroparesis and viral gastroenteritis as he had an episode of watery diarrhea.  - admit to inpatient - clear liquid diet/ IV fluids/ pain control/ reglan/ protonix. - repeat lipase levels in am - gastric emptying study in am to rule  out gastroparesis, if he has gastroparesis, start him on reglan.     2. Diabetes Mellitus: Hold metformin - hgba1c - SSI  3. Hypertension: controlled.   4. Neuropathy: will start him on lyrica.   5. Hyperkalemia: no peaked T waves on EKG. No chest pain. He had a bowel movement in ED and refused kayexalate. Will repeat Potassium tonight, if still elevated give him a dose of kayexalate.   6. DVT prophylaxis.   7 leukocytosis: probably from stress reaction. Repeat cbc in am.     Code Status: full code Family Communication: none at bedside Disposition Plan: pending.     [facility] Pager [phone]  If 7PM-7AM, please contact night-coverage [email or web address] Password [facility] 06/22/2012, 6:37 PM

## 2012-06-22 NOTE — ED Notes (Signed)
Patient with emesis x 1- liquid- about 20 ccs.

## 2012-06-23 DIAGNOSIS — I10 Essential (primary) hypertension: Secondary | ICD-10-CM

## 2012-06-23 DIAGNOSIS — R112 Nausea with vomiting, unspecified: Secondary | ICD-10-CM

## 2012-06-23 LAB — BASIC METABOLIC PANEL
BUN: 12 mg/dL (ref 6–23)
CO2: 25 mEq/L (ref 19–32)
Calcium: 8.7 mg/dL (ref 8.4–10.5)
Chloride: 103 mEq/L (ref 96–112)
Creatinine, Ser: 0.9 mg/dL (ref 0.50–1.35)
GFR calc Af Amer: >90 mL/min (ref >90)
GFR calc non Af Amer: >90 mL/min (ref >90)
Glucose, Bld: 121 mg/dL — ABNORMAL HIGH (ref 70–99)
Potassium: 3.4 mEq/L — ABNORMAL LOW (ref 3.5–5.1)
Sodium: 137 mEq/L (ref 135–145)

## 2012-06-23 LAB — CBC
HCT: 33.9 % — ABNORMAL LOW (ref 39.0–52.0)
Hemoglobin: 11.9 g/dL — ABNORMAL LOW (ref 13.0–17.0)
MCH: 28.9 pg (ref 26.0–34.0)
MCHC: 35.1 g/dL (ref 30.0–36.0)
MCV: 82.3 fL (ref 78.0–100.0)
Platelets: 102 10*3/uL — ABNORMAL LOW (ref 150–400)
RBC: 4.12 MIL/uL — ABNORMAL LOW (ref 4.22–5.81)
RDW: 13.2 % (ref 11.5–15.5)
WBC: 6.1 10*3/uL (ref 4.0–10.5)

## 2012-06-23 LAB — GLUCOSE, CAPILLARY
Glucose-Capillary: 123 mg/dL — ABNORMAL HIGH (ref 70–99)
Glucose-Capillary: 207 mg/dL — ABNORMAL HIGH (ref 70–99)

## 2012-06-23 LAB — HEMOGLOBIN A1C
Hgb A1c MFr Bld: 9.6 % — ABNORMAL HIGH (ref <5.7)
Mean Plasma Glucose: 229 mg/dL — ABNORMAL HIGH (ref <117)

## 2012-06-23 LAB — LIPASE, BLOOD: Lipase: 48 U/L (ref 11–59)

## 2012-06-23 LAB — CLOSTRIDIUM DIFFICILE BY PCR: Toxigenic C. Difficile by PCR: NEGATIVE

## 2012-06-23 MED ORDER — BIOTENE DRY MOUTH MT LIQD
15.0000 mL | Freq: Two times a day (BID) | OROMUCOSAL | Status: DC
  Administered 2012-06-23 (×2): 15 mL via OROMUCOSAL

## 2012-06-23 MED ORDER — CHLORHEXIDINE GLUCONATE 0.12 % MT SOLN
15.0000 mL | Freq: Two times a day (BID) | OROMUCOSAL | Status: DC
  Administered 2012-06-23: 15 mL via OROMUCOSAL
  Filled 2012-06-23 (×4): qty 15

## 2012-06-23 NOTE — ED Provider Notes (Signed)
I saw and evaluated the patient, reviewed the resident's note and I agree with the findings and plan.  Shelda Jakes, MD 06/23/12 [phone]

## 2012-06-23 NOTE — Progress Notes (Signed)
Inpatient Diabetes Program Recommendations  AACE/ADA: New Consensus Statement on Inpatient Glycemic Control (2013)  Target Ranges:  Prepandial:   less than 140 mg/dL      Peak postprandial:   less than 180 mg/dL (1-2 hours)      Critically ill patients:  140 - 180 mg/dL   Reason for Visit: CBGs 2/20  290-178 mg/dl       4/69  629-528 mg/dl  Inpatient Diabetes Program Recommendations Correction (SSI): Increase Novolog correction scale to MODERATE TID & HS if CBGs continue greater than 180 mg/dl.  Note:

## 2012-06-23 NOTE — Progress Notes (Signed)
1700 Discharge instructions reviewed with patient and wife verbalize and understands. Left floor ambulatory accompanied by wife. Skin WNL

## 2012-06-23 NOTE — Progress Notes (Signed)
C diff PCR negative D/c contact precautions

## 2012-06-23 NOTE — Discharge Summary (Signed)
Physician Discharge Summary  [name]:[id number] DOB: Nov 02, 1965 DOA: 06/22/2012  PCP: Alva Garnet., MD  Admit date: 06/22/2012 Discharge date: 06/23/2012  Time spent: less than 30 minutes  Recommendations for Outpatient Follow-up:  1. With Dr. Andi Devon, PCP in 4 days with repeat labs (CBC & BMP).  Discharge Diagnoses:  Active Problems:   Type II or unspecified type diabetes mellitus with unspecified complication, uncontrolled   Essential hypertension, benign   Pancreatitis, acute   Nausea with vomiting   Abdominal pain, epigastric   Acute hyperkalemia   Leukocytosis, unspecified   Diabetic neuropathy, painful   Discharge Condition: Improved & Stable  Diet recommendation: diabetic and heart healthy diet.  Filed Weights   06/22/12 1300 06/22/12 2225  Weight: 120.203 kg (265 lb) 119.7 kg (263 lb 14.3 oz)    History of present illness:  47 year old African American male patient with history of long-standing type 2 diabetes mellitus, hypertension, chronic back pain, admitted on 06/22/12 with complaints of abdominal pain, nausea, vomiting and diarrhea. He denied fever or chills. He gave history of eating some fried chicken wings at a gas station the day before. No sickly contacts. In the ED, lipase mildly elevated, mild hyperkalemia and CT abdomen showed cirrhosis with splenomegaly. Patient denies alcohol abuse-drinks occasionally. He was admitted for further evaluation and management.  Hospital Course:  1. Acute viral gastroenteritis versus food poisoning: Patient denies history of recurrent nausea, vomiting or abdominal pain. He did volunteer to eating chicken at a gas station the day prior to his symptoms. He complained of diffuse 8/10 abdominal pain, multiple episodes of vomiting consisting of bilious material but no blood or coffee grounds. He initially did not have diarrhea but after he was admitted, he had 3 explosive watery diarrhea. Today he felt  significantly better with no nausea, vomiting and abdominal pain was intermittent and mild. His diet was advanced which he tolerated without any further symptoms. He had low-grade fever which may be secondary to GE. He has no other symptoms to suggest source for fever. He was given the options of monitoring overnight versus discharge home and he opted latter. He was advised to seek immediate medical attention if he had worsening/recurrent symptoms. He verbalized understanding. 2. Hyperkalemia: Patient declined Kayexalate. Subsequently had multiple diarrheal episodes and potassium was down to 3.4. He declined potassium supplementation. 3. Type 2 diabetes mellitus: poorly controlled as evidenced by A1c of 9.6. Resume home medications-will need adjustment as outpatient. 4. Hypertension: Controlled. Continue ARB and HCTZ. 5. Cirrhosis and splenomegaly on CT abdomen: Patient was not aware of this diagnosis. He denies history of alcohol abuse. This will need further workup as outpatient. He was advised regarding alcohol abstinence. 6. Intermittent thrombocytopenia: He has even had this in 2010. This may be related to splenomegaly. Outpatient follow up. 7. Anemia: Dilutional versus? Lab variation. No obvious bleeding. Follow up CBC in 4 days at PCP's office. 8. Back pain: May be related to lumbar spondylosis.  Procedures:  None   Consultations:  None  Discharge Exam:  Complaints: No further nausea, vomiting, abdominal pain or diarrhea. Tolerated diet.  Filed Vitals:   06/23/12 0526 06/23/12 1100 06/23/12 1141 06/23/12 1354  BP: 115/55   146/74  Pulse: 89   98  Temp: 99.9 F (37.7 C) 101 F (38.3 C) 99.7 F (37.6 C) 100.5 F (38.1 C)  TempSrc: Oral   Oral  Resp:    19  Height:      Weight:  SpO2: 99%   98%     General exam: Comfortable. Does not look septic or toxic.  Respiratory system: Clear. No increased work of breathing.  Cardiovascular system: S1 and S2 heard, RRR. No  JVD, murmurs or pedal edema.  Gastrointestinal system: Abdomen is nondistended, soft and nontender. Normal bowel sounds heard.  Central nervous system: Alert and oriented. No focal neurological deficits.  Extremities: Symmetric 5 x 5 power.  Discharge Instructions      Discharge Orders   Future Orders Complete By Expires     Call MD for:  persistant nausea and vomiting  As directed     Call MD for:  severe uncontrolled pain  As directed     Call MD for:  temperature >100.4  As directed     Call MD for:  As directed     Comments:      Diarrhea.    Diet - low sodium heart healthy  As directed     Diet Carb Modified  As directed     Increase activity slowly  As directed         Medication List    TAKE these medications       eszopiclone 3 MG Tabs  Generic drug:  Eszopiclone  Take 3 mg by mouth at bedtime. Take immediately before bedtime     glyBURIDE-metformin 5-500 MG per tablet  Commonly known as:  GLUCOVANCE  Take 2 tablets by mouth 2 (two) times daily.     HYDROcodone-acetaminophen 5-325 MG per tablet  Commonly known as:  NORCO/VICODIN  Take 1 tablet by mouth every 6 (six) hours as needed for pain.     lansoprazole 15 MG capsule  Commonly known as:  PREVACID  Take 15 mg by mouth daily.     losartan-hydrochlorothiazide 50-12.5 MG per tablet  Commonly known as:  HYZAAR  Take 1 tablet by mouth daily.       Follow-up Information   Follow up with Alva Garnet., MD. Schedule an appointment as soon as possible for a visit in 4 days. (To be seen with repeat labs (CBC & BMP))    Contact information:   [address] [place] [address] [phone]        The results of significant diagnostics from this hospitalization (including imaging, microbiology, ancillary and laboratory) are listed below for reference.    Significant Diagnostic Studies: Ct Abdomen Pelvis W Contrast  06/22/2012  *RADIOLOGY REPORT*  Clinical Data: Abdominal pain.   Nausea.  Emesis.  CT ABDOMEN AND PELVIS WITH CONTRAST  Technique:  Multidetector CT imaging of the abdomen and pelvis was performed following the standard protocol during bolus administration of intravenous contrast.  Contrast: OMNIPAQUE IOHEXOL 300 MG/ML  SOLN  Comparison: None.  Findings: Mild dependent atelectasis is present bilaterally, worse on the left.  Heart size is normal.  No significant pleural or pericardial effusion is present.  Mild diffuse fatty infiltration is suggested. There is irregularity along the left lobe of the, compatible with cirrhosis.  The portal vein is enlarged.  Varices are noted. The umbilical vein is recannulated.  There is mild enlargement of the spleen.  The stomach, duodenum, and pancreas are within normal limits.  The common bile duct and gallbladder are normal.  The adrenal glands are normal bilaterally.  The kidneys are unremarkable. Mildly enlarged retroperitoneal lymph nodes are likely reactive. Atherosclerotic calcifications are present in the aorta without aneurysm.  The rectosigmoid colon is within normal limits.  The remainder  the colon is unremarkable.  The appendix is visualized and within normal limits.  The small bowel is unremarkable.  The L5-S1 disc spaces fused.  Prominent to endplate osteophytes and facet degenerative change contribute to severe foraminal stenosis bilaterally.  More mild degenerative changes are evident at L3-4 L4- 5.  No focal lytic or blastic lesions are present in the axial skeleton.  IMPRESSION:  1.  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 2.  No discrete hepatic lesion. 3.  No other acute or focal abnormality to explain the patient's symptoms. 4.  Severe spondylosis at L5-S1.   Original Report Authenticated By: Marin Roberts, M.D.     Microbiology: Recent Results (from the past 240 hour(s))  CLOSTRIDIUM DIFFICILE BY PCR     Status: None   Collection Time     06/22/12  7:08 PM      Result Value Range Status   C difficile by pcr NEGATIVE  NEGATIVE Final     Labs: Basic Metabolic Panel:  Recent Labs Lab 06/22/12 1455 06/22/12 1511 06/22/12 1913 06/23/12 0531  NA 134* 139  --  137  K 5.8* 5.9* 4.3 3.4*  CL 101 107  --  103  CO2 19  --   --  25  GLUCOSE 292* 290*  --  121*  BUN 16 17  --  12  CREATININE 1.17 1.30 0.96 0.90  CALCIUM 9.5  --   --  8.7   Liver Function Tests:  Recent Labs Lab 06/22/12 1455  AST 56*  ALT 52  ALKPHOS 111  BILITOT 1.1  PROT 8.9*  ALBUMIN 3.7    Recent Labs Lab 06/22/12 1455 06/23/12 0531  LIPASE 113* 48   No results found for this basename: AMMONIA,  in the last 168 hours CBC:  Recent Labs Lab 06/22/12 1455 06/22/12 1511 06/22/12 1913 06/23/12 0531  WBC 14.4*  --  9.5 6.1  HGB 14.3 14.6 13.7 11.9*  HCT 40.3 43.0 38.2* 33.9*  MCV 82.1  --  82.0 82.3  PLT 139*  --  120* 102*   Cardiac Enzymes: No results found for this basename: CKTOTAL, CKMB, CKMBINDEX, TROPONINI,  in the last 168 hours BNP: BNP (last 3 results) No results found for this basename: PROBNP,  in the last 8760 hours CBG:  Recent Labs Lab 06/22/12 2227 06/23/12 0751 06/23/12 1217  GLUCAP 178* 123* 207*    Additional labs:  Hemoglobin A1c: 9.6  UA: Not suggestive of UTI.   Signed:  Doral Ventrella  [facility] 06/23/2012, 3:41 PM

## 2012-06-24 NOTE — Care Management Note (Signed)
    Page 1 of 1   06/24/2012     6:15:57 AM   CARE MANAGEMENT NOTE 06/24/2012  Patient:  [name], [name]   Account Number:  [id number]  Date Initiated:  06/23/2012  Documentation initiated by:  Letha Cape  Subjective/Objective Assessment:   dx  DM  admit as observation- from home     Action/Plan:   Anticipated DC Date:  06/23/2012   Anticipated DC Plan:  HOME/SELF CARE      DC Planning Services  CM consult      Choice offered to / List presented to:             Status of service:  Completed, signed off Medicare Important Message given?   (If response is "NO", the following Medicare IM given date fields will be blank) Date Medicare IM given:   Date Additional Medicare IM given:    Discharge Disposition:  HOME/SELF CARE  Per UR Regulation:  Reviewed for med. necessity/level of care/duration of stay  If discussed at Long Length of Stay Meetings, dates discussed:    Comments:  06/24/12 6:14 Letha Cape RN, BSN [phone] pta indep, no needs anticipated.

## 2014-02-17 ENCOUNTER — Emergency Department (HOSPITAL_COMMUNITY): Payer: Worker's Compensation

## 2014-02-17 ENCOUNTER — Emergency Department (HOSPITAL_COMMUNITY)
Admission: EM | Admit: 2014-02-17 | Discharge: 2014-02-18 | Disposition: A | Discharge status: Home / Self Care | Payer: Worker's Compensation | Attending: Emergency Medicine | Admitting: Emergency Medicine

## 2014-02-17 ENCOUNTER — Encounter (HOSPITAL_COMMUNITY): Payer: Self-pay | Admitting: Emergency Medicine

## 2014-02-17 DIAGNOSIS — Z79899 Other long term (current) drug therapy: Secondary | ICD-10-CM | POA: Insufficient documentation

## 2014-02-17 DIAGNOSIS — W010XXA Fall on same level from slipping, tripping and stumbling without subsequent striking against object, initial encounter: Secondary | ICD-10-CM | POA: Insufficient documentation

## 2014-02-17 DIAGNOSIS — S20212A Contusion of left front wall of thorax, initial encounter: Secondary | ICD-10-CM | POA: Diagnosis not present

## 2014-02-17 DIAGNOSIS — Y9389 Activity, other specified: Secondary | ICD-10-CM | POA: Diagnosis not present

## 2014-02-17 DIAGNOSIS — Y9289 Other specified places as the place of occurrence of the external cause: Secondary | ICD-10-CM | POA: Insufficient documentation

## 2014-02-17 DIAGNOSIS — W19XXXA Unspecified fall, initial encounter: Secondary | ICD-10-CM

## 2014-02-17 DIAGNOSIS — I10 Essential (primary) hypertension: Secondary | ICD-10-CM | POA: Insufficient documentation

## 2014-02-17 DIAGNOSIS — Z87891 Personal history of nicotine dependence: Secondary | ICD-10-CM | POA: Diagnosis not present

## 2014-02-17 DIAGNOSIS — E119 Type 2 diabetes mellitus without complications: Secondary | ICD-10-CM | POA: Diagnosis not present

## 2014-02-17 DIAGNOSIS — Y990 Civilian activity done for income or pay: Secondary | ICD-10-CM | POA: Diagnosis not present

## 2014-02-17 DIAGNOSIS — S299XXA Unspecified injury of thorax, initial encounter: Secondary | ICD-10-CM | POA: Diagnosis present

## 2014-02-17 MED ORDER — KETOROLAC TROMETHAMINE 30 MG/ML IJ SOLN
30.0000 mg | Freq: Once | INTRAMUSCULAR | Status: AC
  Administered 2014-02-18: 30 mg via INTRAVENOUS
  Filled 2014-02-17: qty 1

## 2014-02-17 MED ORDER — DIAZEPAM 5 MG PO TABS
5.0000 mg | ORAL_TABLET | Freq: Once | ORAL | Status: AC
  Administered 2014-02-18: 5 mg via ORAL
  Filled 2014-02-17: qty 1

## 2014-02-17 MED ORDER — HYDROMORPHONE HCL 1 MG/ML IJ SOLN
1.0000 mg | Freq: Once | INTRAMUSCULAR | Status: AC
  Administered 2014-02-18: 1 mg via INTRAVENOUS
  Filled 2014-02-17: qty 1

## 2014-02-17 NOTE — ED Notes (Signed)
Pt presents via EMS from work with c/o fall. Pt fell onto the floor that had recently been mopped. Pt reports he fell on his left side, c/o pain to his left rib cage area, no neck or back pain. Ambulatory on scene.

## 2014-02-18 MED ORDER — OXYCODONE-ACETAMINOPHEN 5-325 MG PO TABS: 1.0000 | ORAL_TABLET | ORAL | Status: DC | PRN

## 2014-02-18 MED ORDER — HYDROMORPHONE HCL 1 MG/ML IJ SOLN
1.0000 mg | Freq: Once | INTRAMUSCULAR | Status: DC
  Filled 2014-02-18: qty 1

## 2014-02-18 MED ORDER — HYDROMORPHONE HCL 1 MG/ML IJ SOLN
1.0000 mg | Freq: Once | INTRAMUSCULAR | Status: AC
  Administered 2014-02-18: 1 mg via INTRAVENOUS

## 2014-02-18 MED ORDER — IBUPROFEN 600 MG PO TABS: 600.0000 mg | ORAL_TABLET | Freq: Three times a day (TID) | ORAL | Status: DC | PRN

## 2014-02-18 NOTE — Discharge Instructions (Signed)

## 2014-02-18 NOTE — ED Provider Notes (Signed)
CSN: [id number]     Arrival date & time 02/17/14  2249 History   First MD Initiated Contact with Patient 02/17/14 2322     Chief Complaint  Patient presents with  . Fall      HPI Patient presents to the emergency department after falling tonight at work.  He slipped on the wet ground and fell onto his left lateral chest.  Reports severe pain in his left lateral chest.  No difficulty breathing.  Pain is worse with palpation and movement of his left chest wall.  Denies neck pain.  No head injury.  He does report some mild mid back pain.  Denies low back pain.  No weakness of his arms or legs.  No other complaints.  In his normal state of health earlier tonight.   Past Medical History  Diagnosis Date  . Hypertension   . Diabetes mellitus without complication    Past Surgical History  Procedure Laterality Date  . Appendectomy    . Back surgery     No family history on file. History  Substance Use Topics  . Smoking status: Former Smoker    Quit date: 06/23/1995  . Smokeless tobacco: Not on file  . Alcohol Use: 0.0 oz/week     Comment: 3 drinks a month    Review of Systems  All other systems reviewed and are negative.     Allergies  Review of patient's allergies indicates no known allergies.  Home Medications   Prior to Admission medications   Medication Sig Start Date End Date Taking? Authorizing Provider  Buprenorphine (BUTRANS) 15 MCG/HR PTWK Place 1 patch onto the skin every 7 (seven) days.   Yes Historical Provider, MD  glyBURIDE-metformin (GLUCOVANCE) 5-500 MG per tablet Take 2 tablets by mouth 2 (two) times daily.   Yes Historical Provider, MD  lansoprazole (PREVACID) 15 MG capsule Take 15 mg by mouth daily.   Yes Historical Provider, MD   BP 157/78  Pulse 85  Temp(Src) 98.3 F (36.8 C) (Oral)  Resp 26  Ht 6' (1.829 m)  Wt 243 lb (110.224 kg)  BMI 32.95 kg/m2  SpO2 100% Physical Exam  Nursing note and vitals reviewed. Constitutional: He is oriented to  person, place, and time. He appears well-developed and well-nourished.  HENT:  Head: Normocephalic and atraumatic.  Eyes: EOM are normal.  Neck: Normal range of motion.  C-spine nontender.  C-spine cleared by Nexus criteria.  Cardiovascular: Normal rate, regular rhythm, normal heart sounds and intact distal pulses.   Pulmonary/Chest: Effort normal and breath sounds normal. No respiratory distress.  Tenderness of left lateral chest wall without crepitus or obvious deformity  Abdominal: Soft. He exhibits no distension. There is no tenderness.  Musculoskeletal: Normal range of motion.  Mild thoracic and parathoracic tenderness without thoracic step-off.  No lumbar tenderness  Neurological: He is alert and oriented to person, place, and time.  Skin: Skin is warm and dry.  Psychiatric: He has a normal mood and affect. Judgment normal.    ED Course  Procedures (including critical care time) Labs Review Labs Reviewed - No data to display  Imaging Review Dg Chest 2 View  02/18/2014   CLINICAL DATA:  48 year old male status post fall on wet floor with acute shortness of breath and left rib pain. Initial encounter.  EXAM: CHEST  2 VIEW  COMPARISON:  Chest radiographs 08/09/2008.  FINDINGS: AP semi upright and lateral views of the chest at 0022 hr. Mildly lower lung volumes. Stable cardiac size  and mediastinal contours. Visualized tracheal air column is within normal limits. No pneumothorax, pulmonary edema, pleural effusion or confluent pulmonary opacity. No acute displaced rib fracture identified. No acute osseous abnormality identified.  IMPRESSION: Lower lung volumes, otherwise no acute cardiopulmonary abnormality.   Electronically Signed   By: Lars Pinks M.D.   On: 02/18/2014 00:41   Dg Thoracic Spine 2 View  02/18/2014   CLINICAL DATA:  48 year old male status post fall on wet floor with acute shortness of breath and left rib pain. Initial encounter.  EXAM: THORACIC SPINE - 2 VIEW  COMPARISON:   Chest radiographs 08/09/2008.  FINDINGS: Bone mineralization is within normal limits. Normal thoracic segmentation. Cervicothoracic junction alignment is within normal limits. Stable thoracic vertebral height and alignment. Posterior ribs appear grossly intact.  IMPRESSION: No acute fracture or listhesis identified in the thoracic spine.   Electronically Signed   By: Lars Pinks M.D.   On: 02/18/2014 00:42     EKG Interpretation None      MDM   Final diagnoses:  None    Left lateral chest wall trauma.  Chest x-ray normal.  Thoracic spine x-rays are normal.  C-spine is nontender and cleared by Nexus criteria.  5 out of 5 strength of bilateral upper lower extremity major muscle groups.  Normal range of motion of all major joints.  Home with incentive spirometry and pain control.  Patient understands to return to the ER for new or worsening symptoms.  Pneumonia warnings given    Hoy Morn, MD 02/18/14 [phone]

## 2014-02-18 NOTE — ED Notes (Signed)
Patient transported to X-ray 

## 2014-02-21 ENCOUNTER — Encounter (HOSPITAL_COMMUNITY): Payer: Self-pay | Admitting: Emergency Medicine

## 2014-02-21 ENCOUNTER — Emergency Department (HOSPITAL_COMMUNITY)
Admission: EM | Admit: 2014-02-21 | Discharge: 2014-02-21 | Disposition: A | Discharge status: Home / Self Care | Payer: BC Managed Care – PPO | Attending: Emergency Medicine | Admitting: Emergency Medicine

## 2014-02-21 DIAGNOSIS — Z79899 Other long term (current) drug therapy: Secondary | ICD-10-CM | POA: Insufficient documentation

## 2014-02-21 DIAGNOSIS — Z87891 Personal history of nicotine dependence: Secondary | ICD-10-CM | POA: Insufficient documentation

## 2014-02-21 DIAGNOSIS — G8911 Acute pain due to trauma: Secondary | ICD-10-CM | POA: Insufficient documentation

## 2014-02-21 DIAGNOSIS — R0781 Pleurodynia: Secondary | ICD-10-CM | POA: Insufficient documentation

## 2014-02-21 DIAGNOSIS — E119 Type 2 diabetes mellitus without complications: Secondary | ICD-10-CM | POA: Insufficient documentation

## 2014-02-21 DIAGNOSIS — I10 Essential (primary) hypertension: Secondary | ICD-10-CM | POA: Insufficient documentation

## 2014-02-21 MED ORDER — IBUPROFEN 600 MG PO TABS: 600.0000 mg | ORAL_TABLET | Freq: Four times a day (QID) | ORAL | Status: DC | PRN

## 2014-02-21 MED ORDER — OXYCODONE-ACETAMINOPHEN 5-325 MG PO TABS: 1.0000 | ORAL_TABLET | Freq: Four times a day (QID) | ORAL | Status: DC | PRN

## 2014-02-21 NOTE — ED Provider Notes (Signed)
CSN: [id number]     Arrival date & time 02/21/14  1241 History  This chart was scribed for non-physician practitioner working with Varney Biles, MD by Mercy Moore, ED Scribe. This patient was seen in room WTR6/WTR6 and the patient's care was started at 2:02 PM.   Chief Complaint  Patient presents with  . Rib pain from fall   . Pain med refill    The history is provided by the patient. No language interpreter was used.   HPI Comments: [name] is a 48 y.o. male who presents to the Emergency Department requesting medication refill for his rib pain resulting from a fall four days ago. Patient slipped on a wet floor while at work and landed on his left lateral chest wall. Patient evaluated here following the injury; his chest and thoracic spine imaging was normal. Patient was discharged with ibuprofen and Percocet. Patient states that he was given enough medication to hold him over until he could see his PCP. After contacting his PCP, he was informed that he would be able to be seen until next week and the she would not refill his prescription. Thus patient's visit prompted by desire for updated prescription, because he has exhausted his medications.  Today, patient reports exacerbated pain with speaking, sneezing, and hiccipping. Patient denies increased chest pain reporting that his pain severity has remained unchanged. Denies SOB.  Patient reports sleep disturbance due to pain severity and states that he is unable to comfortably turn over and has been forced to sleep on his back. Patient denies fevers or cough.   Past Medical History  Diagnosis Date  . Hypertension   . Diabetes mellitus without complication    Past Surgical History  Procedure Laterality Date  . Appendectomy    . Back surgery     No family history on file. History  Substance Use Topics  . Smoking status: Former Smoker    Quit date: 06/23/1995  . Smokeless tobacco: Not on file  . Alcohol Use: 0.0 oz/week   Comment: 3 drinks a month    Review of Systems  Constitutional: Negative for fever and chills.  Respiratory: Negative for cough and shortness of breath.   Cardiovascular: Positive for chest pain.  Skin: Negative for wound.    Allergies  Review of patient's allergies indicates no known allergies.  Home Medications   Prior to Admission medications   Medication Sig Start Date End Date Taking? Authorizing Provider  Buprenorphine (BUTRANS) 15 MCG/HR PTWK Place 1 patch onto the skin every 7 (seven) days.   Yes Historical Provider, MD  gabapentin (NEURONTIN) 600 MG tablet Take 1,200-1,800 mg by mouth 2 (two) times daily. Take 2 tablets- 1200mg  in the morning and 3 tablets= 1800mg  at night.   Yes Historical Provider, MD  glyBURIDE-metformin (GLUCOVANCE) 5-500 MG per tablet Take 2 tablets by mouth 2 (two) times daily.   Yes Historical Provider, MD  ibuprofen (ADVIL,MOTRIN) 600 MG tablet Take 1 tablet (600 mg total) by mouth every 8 (eight) hours as needed. 02/18/14  Yes Hoy Morn, MD  lansoprazole (PREVACID) 15 MG capsule Take 15 mg by mouth daily.   Yes Historical Provider, MD  oxyCODONE-acetaminophen (PERCOCET/ROXICET) 5-325 MG per tablet Take 1 tablet by mouth every 4 (four) hours as needed for severe pain. 02/18/14  Yes Hoy Morn, MD   Triage Vitals: BP 166/85  Pulse 86  Temp(Src) 98.1 F (36.7 C) (Oral)  Resp 18  SpO2 100%  Physical Exam  Nursing note  and vitals reviewed. Constitutional: He is oriented to person, place, and time. He appears well-developed and well-nourished. No distress.  HENT:  Head: Normocephalic and atraumatic.  Eyes: EOM are normal.  Neck: Normal range of motion. Neck supple. No tracheal deviation present.  Cardiovascular: Normal rate, regular rhythm, normal heart sounds and intact distal pulses.   Pulmonary/Chest: Effort normal and breath sounds normal. No respiratory distress. He has no wheezes. He has no rales.  Musculoskeletal: Normal range of  motion.  Tenderness to palpation of left anterior chest wall. No bruising, erythema or edema. No crepitus.  Neurological: He is alert and oriented to person, place, and time.  Skin: Skin is warm and dry.  Psychiatric: He has a normal mood and affect. His behavior is normal.    ED Course  Procedures (including critical care time)  COORDINATION OF CARE: 2:14 PM- Will update prescriptions. Patient advised to follow up with his PCP. Discussed treatment plan with patient at bedside and patient agreed to plan.   Labs Review Labs Reviewed - No data to display  Imaging Review No results found.   EKG Interpretation None      MDM   Final diagnoses:  None   Patient presenting with chest wall pain that has been present since a fall four days ago.  CXR and thoracic spine xray done four days ago and were negative.  Lungs CTAB.  Pulse ox 100 on RA.  Feel that the patient stable for discharge.  Patient given Rx for small quantity of pain medication.  He has a follow up appt with PCP next week.  Return precautions given.    I personally performed the services described in this documentation, which was scribed in my presence. The recorded information has been reviewed and is accurate.    Hyman Bible, PA-C 02/22/14 [phone]

## 2014-02-21 NOTE — ED Notes (Signed)
Pt states he was here on Sunday and seen for a fall and dx w/ chest contusion.  States that no where else will fill his pain meds and he needs more.

## 2014-02-22 NOTE — ED Provider Notes (Signed)
Medical screening examination/treatment/procedure(s) were performed by non-physician practitioner and as supervising physician I was immediately available for consultation/collaboration.   EKG Interpretation None       Varney Biles, MD 02/22/14 1546

## 2014-02-27 ENCOUNTER — Other Ambulatory Visit: Payer: Self-pay | Admitting: Internal Medicine

## 2014-02-27 DIAGNOSIS — K7460 Unspecified cirrhosis of liver: Secondary | ICD-10-CM

## 2014-02-28 ENCOUNTER — Other Ambulatory Visit: Payer: BC Managed Care – PPO

## 2014-03-13 ENCOUNTER — Encounter (HOSPITAL_COMMUNITY): Payer: Self-pay | Admitting: Emergency Medicine

## 2014-03-13 ENCOUNTER — Emergency Department (HOSPITAL_COMMUNITY): Payer: BC Managed Care – PPO

## 2014-03-13 ENCOUNTER — Emergency Department (HOSPITAL_COMMUNITY)
Admission: EM | Admit: 2014-03-13 | Discharge: 2014-03-13 | Disposition: A | Discharge status: Home / Self Care | Payer: BC Managed Care – PPO | Attending: Emergency Medicine | Admitting: Emergency Medicine

## 2014-03-13 DIAGNOSIS — M542 Cervicalgia: Secondary | ICD-10-CM | POA: Insufficient documentation

## 2014-03-13 DIAGNOSIS — R51 Headache: Secondary | ICD-10-CM | POA: Insufficient documentation

## 2014-03-13 DIAGNOSIS — W1839XA Other fall on same level, initial encounter: Secondary | ICD-10-CM | POA: Insufficient documentation

## 2014-03-13 DIAGNOSIS — Z87891 Personal history of nicotine dependence: Secondary | ICD-10-CM | POA: Insufficient documentation

## 2014-03-13 DIAGNOSIS — R0781 Pleurodynia: Secondary | ICD-10-CM

## 2014-03-13 DIAGNOSIS — S2232XD Fracture of one rib, left side, subsequent encounter for fracture with routine healing: Secondary | ICD-10-CM

## 2014-03-13 DIAGNOSIS — R519 Headache, unspecified: Secondary | ICD-10-CM

## 2014-03-13 DIAGNOSIS — I10 Essential (primary) hypertension: Secondary | ICD-10-CM | POA: Insufficient documentation

## 2014-03-13 DIAGNOSIS — S2242XD Multiple fractures of ribs, left side, subsequent encounter for fracture with routine healing: Secondary | ICD-10-CM | POA: Insufficient documentation

## 2014-03-13 DIAGNOSIS — Z79899 Other long term (current) drug therapy: Secondary | ICD-10-CM | POA: Insufficient documentation

## 2014-03-13 DIAGNOSIS — E119 Type 2 diabetes mellitus without complications: Secondary | ICD-10-CM | POA: Insufficient documentation

## 2014-03-13 MED ORDER — OXYCODONE-ACETAMINOPHEN 5-325 MG PO TABS: 1.0000 | ORAL_TABLET | Freq: Four times a day (QID) | ORAL | Status: DC | PRN

## 2014-03-13 MED ORDER — OXYCODONE-ACETAMINOPHEN 10-325 MG PO TABS: 1.0000 | ORAL_TABLET | ORAL | Status: DC | PRN

## 2014-03-13 MED ORDER — HYDROCODONE-ACETAMINOPHEN 10-325 MG PO TABS: 1.0000 | ORAL_TABLET | Freq: Four times a day (QID) | ORAL | Status: DC | PRN

## 2014-03-13 MED ORDER — HYDROCODONE-ACETAMINOPHEN 5-325 MG PO TABS
2.0000 | ORAL_TABLET | Freq: Once | ORAL | Status: AC
  Administered 2014-03-13: 2 via ORAL
  Filled 2014-03-13: qty 2

## 2014-03-13 NOTE — Discharge Instructions (Signed)
Your x rays show Fractures (Breaks) of 5 ribs on your left side.  Continue to pursue follow up with your employer's Occupational Health physician referral.  No physical labor until cleared by occupational health doctor from your employer.  You may require an additional 10 days or longer to return to work.   Cough or deep breathe 10 times per hour to prevent pneumonia.

## 2014-03-13 NOTE — ED Notes (Signed)
Pt states that he fell at work on Oct 18.  States that he is having continued mid back pain, gen body aches, and "when he tries to focus, there is a reverberation".  Not sure what pt means by this.  Pt requesting that all his bills go to his attorney.

## 2014-03-13 NOTE — ED Provider Notes (Signed)
CSN: [id number]     Arrival date & time 03/13/14  1731 History   First MD Initiated Contact with Patient 03/13/14 1836     Chief Complaint  Patient presents with  . Fall  . Back Pain      HPI  Patient presents for reevaluation of left back and chest pain. On October 18 he had a fall at work. He struck the back of his head and his neck and his left posterior chest. Seen and evaluated that day and had a normal chest x-ray. Discharged with proper instructions to cough and deep breathe or use spirometer and pain medications. He states the physician at that time told him it was possible he had rib fractures though his x-ray was normal. He states he's had difficulty following up with occupational health and has since retained an attorney to help him do so. However % stating he has continued pain in the left side and was getting minimal relief of his pain medicine and is now out of them. Is not short of breath. Has been doing his exercises and coughing and deep breathing. He has not been able to return to any physical activity without a great of pain. Also describes headache difficulty concentrating and neck pain.  Past Medical History  Diagnosis Date  . Hypertension   . Diabetes mellitus without complication    Past Surgical History  Procedure Laterality Date  . Appendectomy    . Back surgery     No family history on file. History  Substance Use Topics  . Smoking status: Former Smoker    Quit date: 06/23/1995  . Smokeless tobacco: Not on file  . Alcohol Use: 0.0 oz/week     Comment: 3 drinks a month    Review of Systems  Constitutional: Negative for fever, chills, diaphoresis, appetite change and fatigue.  HENT: Negative for mouth sores, sore throat and trouble swallowing.   Eyes: Negative for visual disturbance.  Respiratory: Positive for shortness of breath. Negative for cough, chest tightness and wheezing.   Cardiovascular: Positive for chest pain.  Gastrointestinal: Negative  for nausea, vomiting, abdominal pain, diarrhea and abdominal distention.  Endocrine: Negative for polydipsia, polyphagia and polyuria.  Genitourinary: Negative for dysuria, frequency and hematuria.  Musculoskeletal: Positive for neck pain. Negative for gait problem.  Skin: Negative for color change, pallor and rash.  Neurological: Positive for dizziness and headaches. Negative for syncope and light-headedness.  Hematological: Does not bruise/bleed easily.  Psychiatric/Behavioral: Negative for behavioral problems and confusion.      Allergies  Review of patient's allergies indicates no known allergies.  Home Medications   Prior to Admission medications   Medication Sig Start Date End Date Taking? Authorizing Provider  Buprenorphine (BUTRANS) 15 MCG/HR PTWK Place 1 patch onto the skin every 7 (seven) days.   Yes Historical Provider, MD  gabapentin (NEURONTIN) 600 MG tablet Take 1,200-1,800 mg by mouth 2 (two) times daily. Take 2 tablets- 1200mg  in the morning and 3 tablets= 1800mg  at night.   Yes Historical Provider, MD  glyBURIDE-metformin (GLUCOVANCE) 5-500 MG per tablet Take 2 tablets by mouth 2 (two) times daily.   Yes Historical Provider, MD  ibuprofen (ADVIL,MOTRIN) 600 MG tablet Take 1 tablet (600 mg total) by mouth every 6 (six) hours as needed. 02/21/14  Yes Heather Laisure, PA-C  lansoprazole (PREVACID) 15 MG capsule Take 15 mg by mouth daily.   Yes Historical Provider, MD  tiZANidine (ZANAFLEX) 2 MG tablet Take 2 mg by mouth every 8 (eight)  hours as needed for muscle spasms (muscle spasms). For 10 Days.   Yes Historical Provider, MD  ibuprofen (ADVIL,MOTRIN) 600 MG tablet Take 1 tablet (600 mg total) by mouth every 8 (eight) hours as needed. 02/18/14   Hoy Morn, MD  oxyCODONE-acetaminophen (PERCOCET) 10-325 MG per tablet Take 1 tablet by mouth every 4 (four) hours as needed for pain. 03/13/14   Tanna Furry, MD  oxyCODONE-acetaminophen (PERCOCET/ROXICET) 5-325 MG per tablet  Take 1-2 tablets by mouth every 6 (six) hours as needed for severe pain. 03/13/14   Tanna Furry, MD   BP 147/74 mmHg  Pulse 87  Temp(Src) 98.9 F (37.2 C) (Oral)  Resp 18  SpO2 100% Physical Exam  Constitutional: He is oriented to person, place, and time. He appears well-developed and well-nourished. No distress.  HENT:  Head: Normocephalic.    Eyes: Conjunctivae are normal. Pupils are equal, round, and reactive to light. No scleral icterus.  Neck: Normal range of motion. Neck supple. No thyromegaly present.  Cardiovascular: Normal rate and regular rhythm.  Exam reveals no gallop and no friction rub.   No murmur heard. Pulmonary/Chest: Effort normal and breath sounds normal. No respiratory distress. He has no wheezes. He has no rales.      Clear bilateral breath sounds. No bony crepitus or subcutaneous air.  Abdominal: Soft. Bowel sounds are normal. He exhibits no distension. There is no tenderness. There is no rebound.  Musculoskeletal: Normal range of motion.  Neurological: He is alert and oriented to person, place, and time.  Skin: Skin is warm and dry. No rash noted.  Psychiatric: He has a normal mood and affect. His behavior is normal.    ED Course  Procedures (including critical care time) Labs Review Labs Reviewed - No data to display  Imaging Review Dg Ribs Unilateral W/chest Left  03/13/2014   CLINICAL DATA:  Fall or on October 18.  Left anterior rib pain.  EXAM: LEFT RIBS AND CHEST - 3+ VIEW  COMPARISON:  02/18/2014  FINDINGS: There are fractures through the left fifth, seventh, eighth, ninth and tenth ribs. These are nondisplaced are minimally displaced. No effusions. No pneumothorax. Heart is normal size. Lungs are clear.  IMPRESSION: Left fifth and seventh through tenth rib fractures. No associated effusion or pneumothorax.   Electronically Signed   By: Rolm Baptise M.D.   On: 03/13/2014 19:39   Ct Head Wo Contrast  03/13/2014   CLINICAL DATA:  Slip and fall on  floor with headaches and neck pain  EXAM: CT HEAD WITHOUT CONTRAST  CT CERVICAL SPINE WITHOUT CONTRAST  TECHNIQUE: Multidetector CT imaging of the head and cervical spine was performed following the standard protocol without intravenous contrast. Multiplanar CT image reconstructions of the cervical spine were also generated.  COMPARISON:  None.  FINDINGS: CT HEAD FINDINGS  The bony calvarium is intact. The ventricles are of normal size and configuration. No findings to suggest acute hemorrhage, acute infarction or space-occupying mass lesion are noted.  CT CERVICAL SPINE FINDINGS  Seven cervical segments are well visualized. Vertebral body height is well maintained. No acute fracture or acute facet abnormality is noted. No gross soft tissue abnormality is seen.  IMPRESSION: CT of the head:  No acute intracranial abnormality noted.  CT of the cervical spine:  No acute abnormality noted.   Electronically Signed   By: Inez Catalina M.D.   On: 03/13/2014 19:54   Ct Cervical Spine Wo Contrast  03/13/2014   CLINICAL DATA:  Slip and  fall on floor with headaches and neck pain  EXAM: CT HEAD WITHOUT CONTRAST  CT CERVICAL SPINE WITHOUT CONTRAST  TECHNIQUE: Multidetector CT imaging of the head and cervical spine was performed following the standard protocol without intravenous contrast. Multiplanar CT image reconstructions of the cervical spine were also generated.  COMPARISON:  None.  FINDINGS: CT HEAD FINDINGS  The bony calvarium is intact. The ventricles are of normal size and configuration. No findings to suggest acute hemorrhage, acute infarction or space-occupying mass lesion are noted.  CT CERVICAL SPINE FINDINGS  Seven cervical segments are well visualized. Vertebral body height is well maintained. No acute fracture or acute facet abnormality is noted. No gross soft tissue abnormality is seen.  IMPRESSION: CT of the head:  No acute intracranial abnormality noted.  CT of the cervical spine:  No acute abnormality  noted.   Electronically Signed   By: Inez Catalina M.D.   On: 03/13/2014 19:54     EKG Interpretation None      MDM   Final diagnoses:  Rib pain  Headache  Neck pain  Rib fractures, left, with routine healing, subsequent encounter    Patient's x-rays demonstrate 5 nondisplaced rib fractures. No pneumothorax. No pleural fluid. CT of his head and neck are normal. Discussed this at length with him. Asked to continue to pursue follow up with his occupational health physician. He has been doing coughing and deep breathing as demonstrated on his prior visit. Asked to continue this. Additional pain medicine prescription given.    Tanna Furry, MD 03/13/14 2044

## 2014-04-01 ENCOUNTER — Emergency Department (HOSPITAL_COMMUNITY)
Admission: EM | Admit: 2014-04-01 | Discharge: 2014-04-01 | Disposition: A | Discharge status: Home / Self Care | Payer: BC Managed Care – PPO | Attending: Emergency Medicine | Admitting: Emergency Medicine

## 2014-04-01 ENCOUNTER — Encounter (HOSPITAL_COMMUNITY): Payer: Self-pay

## 2014-04-01 ENCOUNTER — Ambulatory Visit
Admission: RE | Admit: 2014-04-01 | Discharge: 2014-04-01 | Disposition: A | Discharge status: Home / Self Care | Payer: BC Managed Care – PPO | Source: Ambulatory Visit | Attending: Internal Medicine | Admitting: Internal Medicine

## 2014-04-01 DIAGNOSIS — S2232XD Fracture of one rib, left side, subsequent encounter for fracture with routine healing: Secondary | ICD-10-CM

## 2014-04-01 DIAGNOSIS — E119 Type 2 diabetes mellitus without complications: Secondary | ICD-10-CM | POA: Insufficient documentation

## 2014-04-01 DIAGNOSIS — K7460 Unspecified cirrhosis of liver: Secondary | ICD-10-CM

## 2014-04-01 DIAGNOSIS — I10 Essential (primary) hypertension: Secondary | ICD-10-CM | POA: Insufficient documentation

## 2014-04-01 DIAGNOSIS — W01198A Fall on same level from slipping, tripping and stumbling with subsequent striking against other object, initial encounter: Secondary | ICD-10-CM | POA: Insufficient documentation

## 2014-04-01 DIAGNOSIS — Z79899 Other long term (current) drug therapy: Secondary | ICD-10-CM | POA: Insufficient documentation

## 2014-04-01 MED ORDER — HYDROCODONE-ACETAMINOPHEN 10-325 MG PO TABS: 1.0000 | ORAL_TABLET | Freq: Four times a day (QID) | ORAL | Status: DC | PRN

## 2014-04-01 NOTE — ED Provider Notes (Signed)
CSN: [id number]     Arrival date & time 04/01/14  1523 History  This chart was scribed for non-physician practitioner, Delos Haring, PA-C, working with Dorie Rank, MD by Lowella Petties, ED Scribe. The patient was seen in room WTR6/WTR6. Patient's care was started at 5:17 PM.     Chief Complaint  Patient presents with  . Rib Injury   The history is provided by the patient. No language interpreter was used.   HPI Comments: [name] is a 48 y.o. male who presents to the Emergency Department complaining of constant, aching, pain in his left sided ribs after an injury at work 2 months ago. 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 He was diagnosed with 5 rib fractures. He states that the pain is exacerbated by deep breathing. He states that the pain has been gradually increasing after he ran out of his pain medicine a few days ago. He has been given a worker's comp provider, but they have not set him up for an appointment yet. He saw his PCP for this problem, but he states that there office does not prescribe  pain medications and referred him to the pain clinic. He brings in intake forms from the pain clinic. He states that his pain clinic will not give him medication, because this is a workers comp case. Therefore his PCP, attorney and Pain Clinic recommended he go to the  ER. 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 He reports making sure to take multiple big breaths a day to prevent lung infection.  Past Medical History  Diagnosis Date  . Hypertension   . Diabetes mellitus without complication    Past Surgical History  Procedure Laterality Date  . Appendectomy    . Back surgery     History reviewed. No pertinent family history. History  Substance Use Topics  . Smoking status: Former Smoker    Quit date: 06/23/1995  . Smokeless tobacco: Not on file  . Alcohol  Use: 0.0 oz/week     Comment: 3 drinks a month    Review of Systems  Musculoskeletal: Positive for arthralgias (left sided ribs).  All other systems reviewed and are negative.  Allergies  Review of patient's allergies indicates no known allergies.  Home Medications   Prior to Admission medications   Medication Sig Start Date End Date Taking? Authorizing Provider  Buprenorphine (BUTRANS) 15 MCG/HR PTWK Place 1 patch onto the skin every 7 (seven) days.   Yes Historical Provider, MD  gabapentin (NEURONTIN) 600 MG tablet Take 1,200-1,800 mg by mouth 2 (two) times daily. Take 2 tablets- 1200mg  in the morning and 3 tablets= 1800mg  at night.   Yes Historical Provider, MD  glyBURIDE-metformin (GLUCOVANCE) 5-500 MG per tablet Take 2 tablets by mouth 2 (two) times daily.   Yes Historical Provider, MD  ibuprofen (ADVIL,MOTRIN) 600 MG tablet Take 1 tablet (600 mg total) by mouth every 6 (six) hours as needed. 02/21/14  Yes Heather Laisure, PA-C  lansoprazole (PREVACID) 15 MG capsule Take 15 mg by mouth daily.   Yes Historical Provider, MD  HYDROcodone-acetaminophen (NORCO) 10-325 MG per tablet Take 1-2 tablets by mouth every 6 (six) hours as needed. 04/01/14   Gidget Quizhpi Marilu Favre, PA-C  ibuprofen (ADVIL,MOTRIN) 600 MG tablet Take 1 tablet (600 mg total) by mouth every 8 (eight) hours as needed. Patient  not taking: Reported on 04/01/2014 02/18/14   Hoy Morn, MD  oxyCODONE-acetaminophen (PERCOCET) 10-325 MG per tablet Take 1 tablet by mouth every 4 (four) hours as needed for pain. Patient not taking: Reported on 04/01/2014 03/13/14   Tanna Furry, MD  oxyCODONE-acetaminophen (PERCOCET/ROXICET) 5-325 MG per tablet Take 1-2 tablets by mouth every 6 (six) hours as needed for severe pain. Patient not taking: Reported on 04/01/2014 03/13/14   Tanna Furry, MD  tiZANidine (ZANAFLEX) 2 MG tablet Take 2 mg by mouth every 8 (eight) hours as needed for muscle spasms (muscle spasms). For 10 Days.    Historical  Provider, MD   Triage Vitals: BP 166/89 mmHg  Pulse 93  Resp 18  SpO2 100% Physical Exam  Constitutional: He is oriented to person, place, and time. He appears well-developed and well-nourished. No distress.  HENT:  Head: Normocephalic and atraumatic.  Eyes: Conjunctivae and EOM are normal.  Neck: Neck supple. No tracheal deviation present.  Cardiovascular: Normal rate, regular rhythm and normal heart sounds.   No murmur heard. Pulmonary/Chest: Effort normal. No respiratory distress. He has no decreased breath sounds. He has no wheezes. He has no rhonchi. He has no rales. He exhibits tenderness and bony tenderness. He exhibits no crepitus and no retraction.    Musculoskeletal: Normal range of motion.  Neurological: He is alert and oriented to person, place, and time.  Skin: Skin is warm and dry.  Psychiatric: He has a normal mood and affect. His behavior is normal.  Nursing note and vitals reviewed.   ED Course  Procedures (including critical care time) DIAGNOSTIC STUDIES: Oxygen Saturation is 100% on room air, normal by my interpretation.    COORDINATION OF CARE: 5:25 PM-Discussed treatment plan which includes pain medication with pt at bedside and pt agreed to plan.   Labs Review Labs Reviewed - No data to display  Imaging Review US Abdomen Complete  04/01/2014   CLINICAL DATA:  Hepatic cirrhosis  EXAM: ULTRASOUND ABDOMEN COMPLETE  COMPARISON:  None.  FINDINGS: Gallbladder: No gallstones or wall thickening visualized. There is no pericholecystic fluid. No sonographic Murphy sign noted.  Common bile duct: Diameter: 4 mm. There is no intrahepatic, common hepatic, or common bile duct dilatation.  Liver: No focal lesion identified. The liver has increased echogenicity and a somewhat lobular pattern consistent with known cirrhosis. Liver measures 16.8 cm in length, prominent. Flow in the portal vein is in the anatomic direction.  IVC: No abnormality visualized.  Pancreas: No mass  or inflammatory focus.  Spleen: Size and appearance within normal limits.  Right Kidney: Length: 12.2 cm. Echogenicity within normal limits. No mass or hydronephrosis visualized.  Left Kidney: Length: 13.3 cm. Echogenicity within normal limits. No mass or hydronephrosis visualized.  Abdominal aorta: No aneurysm visualized.  Other findings: There is no demonstrable ascites.  IMPRESSION: The liver is prominent with an appearance consistent with cirrhosis. While no focal liver lesions are identified, it must be cautioned that the sensitivity of ultrasound for focal liver lesions is diminished in this circumstance.  Study otherwise unremarkable.   Electronically Signed   By: Lowella Grip M.D.   On: 04/01/2014 09:35     EKG Interpretation None      MDM   Final diagnoses:  Fracture of rib of left side, with routine healing, subsequent encounter    Pt is over-all well appearing. He is out of his pain medications due to being a workers comp case, he has had difficulty being assigned a workers  comp provider and saw his PCP, who referred him to pain management, who then said he either has to go without or go to the ED.  I expressed to the patient that I understand that he is stuck in a tight spot at the moment but the ER is also not the place for workers comp patients to receive pain medications and that he will not be given anymore prescriptions for pain medications from the ED after this visit. He voices his understanding and believes he will have been assigned a provider by then.  17:32:46 Discharge Orders Placed TG  HYDROcodone-acetaminophen (NORCO) 10-325 MG per tablet  # 25  48 y.o.Wynema Birch Lorge's evaluation in the Emergency Department is complete. It has been determined that no acute conditions requiring further emergency intervention are present at this time. The patient/guardian have been advised of the diagnosis and plan. We have discussed signs and symptoms that warrant return to the ED,  such as changes or worsening in symptoms.  Vital signs are stable at discharge. Filed Vitals:   04/01/14 1743  BP: 163/83  Pulse: 89  Resp: 16    Patient/guardian has voiced understanding and agreed to follow-up with the PCP or specialist.   Linus Mako, PA-C 04/02/14 1015  Dorie Rank, MD 04/03/14 0006

## 2014-04-01 NOTE — Discharge Instructions (Signed)

## 2014-04-01 NOTE — ED Notes (Signed)
Pt needing additional pain meds for rib injury.  States they cannot fix his ribs but he is out of meds.

## 2014-04-01 NOTE — Progress Notes (Signed)
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  Pt reports having 2 jobs 1) self employed and 2) another job in which is covered by [employer].  Reports his pcp will not see him as a work comp pt nor as a pain management pt. PCP, Willey Blade,  referred him to a pain management provider but that provider would not see him because he is a work comp pt per pt.  Cm explained to pt that this is standard practice and MDs prefer the work comp agency to assigned MD & pain management MD to follow him. Pt only aware that he has a Work Teacher, music.   Reports issues with getting work Teacher, music to return calls to him or his lawyer CM explained to pt that generally a RN reports to/works with a MD.  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 Reports his wife filed his claim and he has noted his bills are being paid via [place].  Referred him back to his wife and BCBS.  Tyleen confirmed with pt that via [facility] all his bills have been noted as work Estate manager/land agent and not Sports administrator. [facility] bills with [employer] comments that bills are to be filed under work comp or to pt not [employer]  Pt informed CM he was a DM pt with "neuropathy", "I take Neurontin for my neuropathy" and need a pcp to monitor these issues CM encouraged pt to go to his bcbs card toll free number or website to find an in network BCBS pcp to manage his non work comp medical needs. Cautioned pt about "double dipping" (seeing providers for pain treatment that is related to his injury and  his neuropathy, not distinguishing between the two illnesses) Discussed with pt that this may cause discrepancies and he may be billed vs billing going to work comp claim Pt at end of interaction stated " I see what you are talking about."     Action/Plan:   ED Cm consulted by ED registration staff, Tyleen with concerns voice by pt about use of [facility] ED as his pcp for work compensation care, billing issues Cm spoke with pt with Tyleen present.  Cm discussed with pt that as a Insurance underwriter comp pt he   Action/Plan Detail:   standardly is assigned a work comp MD, agent, CM, RN Referred pt back to his work Comptroller, Therapist, sports and his "lawyer" Referred pt to assignedurgent care ctrs Encouraged pt to go to his assigned work comp providers for only his work injury- ribs   Anticipated DC Date:  04/01/2014     Status Recommendation to Physician:   Result of Recommendation:    Other ED [place]  Other  Outpatient Services - Pt will follow up  PCP issues    Choice offered to / List presented to:            Status of service:  Completed, signed off  ED  Comments:   ED Comments Detail:

## 2014-04-10 ENCOUNTER — Encounter (HOSPITAL_COMMUNITY): Payer: Self-pay | Admitting: Emergency Medicine

## 2014-04-10 ENCOUNTER — Emergency Department (HOSPITAL_COMMUNITY)
Admission: EM | Admit: 2014-04-10 | Discharge: 2014-04-10 | Disposition: A | Discharge status: Home / Self Care | Payer: Worker's Compensation | Attending: Emergency Medicine | Admitting: Emergency Medicine

## 2014-04-10 DIAGNOSIS — S2232XD Fracture of one rib, left side, subsequent encounter for fracture with routine healing: Secondary | ICD-10-CM

## 2014-04-10 DIAGNOSIS — W01198D Fall on same level from slipping, tripping and stumbling with subsequent striking against other object, subsequent encounter: Secondary | ICD-10-CM | POA: Diagnosis not present

## 2014-04-10 DIAGNOSIS — Z87891 Personal history of nicotine dependence: Secondary | ICD-10-CM | POA: Insufficient documentation

## 2014-04-10 DIAGNOSIS — I10 Essential (primary) hypertension: Secondary | ICD-10-CM | POA: Insufficient documentation

## 2014-04-10 DIAGNOSIS — R0781 Pleurodynia: Secondary | ICD-10-CM | POA: Diagnosis present

## 2014-04-10 DIAGNOSIS — S2242XD Multiple fractures of ribs, left side, subsequent encounter for fracture with routine healing: Secondary | ICD-10-CM | POA: Diagnosis not present

## 2014-04-10 DIAGNOSIS — E119 Type 2 diabetes mellitus without complications: Secondary | ICD-10-CM | POA: Diagnosis not present

## 2014-04-10 DIAGNOSIS — Z79899 Other long term (current) drug therapy: Secondary | ICD-10-CM | POA: Diagnosis not present

## 2014-04-10 MED ORDER — HYDROCODONE-ACETAMINOPHEN 10-325 MG PO TABS: 1.0000 | ORAL_TABLET | Freq: Four times a day (QID) | ORAL | Status: DC | PRN

## 2014-04-10 NOTE — Discharge Instructions (Signed)
Read the information below.  Use the prescribed medication as directed.  Please discuss all new medications with your pharmacist.  Do not take additional tylenol while taking the prescribed pain medication to avoid overdose.  You may return to the Emergency Department at any time for worsening condition or any new symptoms that concern you.    If you develop worsening chest pain, shortness of breath, fever, you pass out, or become weak or dizzy, return to the ER for a recheck.    °

## 2014-04-10 NOTE — ED Notes (Signed)
Pt states on the 18th of October, slipped and broke his ribs. Pt c/o left side rib cage pain.

## 2014-04-10 NOTE — ED Provider Notes (Signed)
CSN: [id number]     Arrival date & time 04/10/14  1514 History  This chart was scribed for non-physician practitioner, Clayton Bibles, PA-C, working with Charlesetta Shanks, MD by Ladene Artist, ED Scribe. This patient was seen in room WTR6/WTR6 and the patient's care was started at 4:42 PM.   Chief Complaint  Patient presents with  . rib pain    The history is provided by the patient. No language interpreter was used.   HPI Comments: [name] is a 48 y.o. male who presents to the Emergency Department complaining of constant L rib pain since a slip and fall on 02/17/14. Pt describes the pain as a sharp and aching sensation. Pain is exacerbated with deep breathing. He currently rates the pain 8/10. Pt also reports HA, not a new symptom. He denies fever, cough. Pt has a doctor's appointment next week but states that he is out of pain medication last night. He requests more mediation until his appointment.  Denies any change in his left rib pain.   Has been set up to see Suella Broad by Workers Comp.   Past Medical History  Diagnosis Date  . Hypertension   . Diabetes mellitus without complication    Past Surgical History  Procedure Laterality Date  . Appendectomy    . Back surgery     No family history on file. History  Substance Use Topics  . Smoking status: Former Smoker    Quit date: 06/23/1995  . Smokeless tobacco: Not on file  . Alcohol Use: 0.0 oz/week     Comment: 3 drinks a month    Review of Systems  Constitutional: Negative for fever and chills.  Respiratory: Negative for cough.   Cardiovascular: Positive for chest pain.  Musculoskeletal: Positive for arthralgias.  Skin: Negative for color change.  Allergic/Immunologic: Negative for immunocompromised state.  Neurological: Positive for headaches.  Hematological: Does not bruise/bleed easily.   Allergies  Review of patient's allergies indicates no known allergies.  Home Medications   Prior to Admission medications    Medication Sig Start Date End Date Taking? Authorizing Provider  Buprenorphine (BUTRANS) 15 MCG/HR PTWK Place 1 patch onto the skin every 7 (seven) days.    Historical Provider, MD  gabapentin (NEURONTIN) 600 MG tablet Take 1,200-1,800 mg by mouth 2 (two) times daily. Take 2 tablets- 1200mg  in the morning and 3 tablets= 1800mg  at night.    Historical Provider, MD  glyBURIDE-metformin (GLUCOVANCE) 5-500 MG per tablet Take 2 tablets by mouth 2 (two) times daily.    Historical Provider, MD  HYDROcodone-acetaminophen (NORCO) 10-325 MG per tablet Take 1-2 tablets by mouth every 6 (six) hours as needed. 04/01/14   Tiffany Marilu Favre, PA-C  ibuprofen (ADVIL,MOTRIN) 600 MG tablet Take 1 tablet (600 mg total) by mouth every 8 (eight) hours as needed. Patient not taking: Reported on 04/01/2014 02/18/14   Hoy Morn, MD  ibuprofen (ADVIL,MOTRIN) 600 MG tablet Take 1 tablet (600 mg total) by mouth every 6 (six) hours as needed. 02/21/14   Heather Laisure, PA-C  lansoprazole (PREVACID) 15 MG capsule Take 15 mg by mouth daily.    Historical Provider, MD  oxyCODONE-acetaminophen (PERCOCET) 10-325 MG per tablet Take 1 tablet by mouth every 4 (four) hours as needed for pain. Patient not taking: Reported on 04/01/2014 03/13/14   Tanna Furry, MD  oxyCODONE-acetaminophen (PERCOCET/ROXICET) 5-325 MG per tablet Take 1-2 tablets by mouth every 6 (six) hours as needed for severe pain. Patient not taking: Reported on 04/01/2014  03/13/14   Tanna Furry, MD  tiZANidine (ZANAFLEX) 2 MG tablet Take 2 mg by mouth every 8 (eight) hours as needed for muscle spasms (muscle spasms). For 10 Days.    Historical Provider, MD   Triage Vitals: BP 115/77 mmHg  Pulse 93  Temp(Src) 97.7 F (36.5 C) (Oral)  Resp 20  SpO2 100% Physical Exam  Constitutional: He appears well-developed and well-nourished. No distress.  HENT:  Head: Normocephalic and atraumatic.  Neck: Neck supple.  Cardiovascular: Normal rate, regular rhythm and normal  heart sounds.  Exam reveals no gallop and no friction rub.   No murmur heard. Pulmonary/Chest: Effort normal and breath sounds normal. No respiratory distress. He has no wheezes. He has no rales. He exhibits tenderness.  Tender over L posterior and lateral ribs.   Neurological: He is alert.  Skin: He is not diaphoretic.  Nursing note and vitals reviewed.  ED Course  Procedures (including critical care time) DIAGNOSTIC STUDIES: Oxygen Saturation is 100% on RA, norma by my interpretation.    COORDINATION OF CARE: 4:47 PM-Discussed treatment plan which includes Norco with pt at bedside and pt agreed to plan.   Labs Review Labs Reviewed - No data to display  Imaging Review No results found.   EKG Interpretation None      MDM   Final diagnoses:  Rib fractures, left, with routine healing, subsequent encounter   Afebrile, nontoxic patient with known left rib fractures, attempting to follow up outpatient but needing workers comp approval. Has finally gotten approval with appointment next week but is out of pain medication and pain is uncontrolled.  NO change in pain or any new symptoms.  Doubt pneumonia.   D/C home with pain medication refill.  Pt aware ED is not the place for chronic pain and will follow up with Dr Nelva Bush next week.   Discussed result, findings, treatment, and follow up  with patient.  Pt given return precautions.  Pt verbalizes understanding and agrees with plan.      I personally performed the services described in this documentation, which was scribed in my presence. The recorded information has been reviewed and is accurate.    Clayton Bibles, PA-C 04/10/14 1749  Charlesetta Shanks, MD 04/11/14 1754

## 2017-07-28 ENCOUNTER — Encounter: Payer: Self-pay | Admitting: Family Medicine

## 2017-07-28 ENCOUNTER — Ambulatory Visit (INDEPENDENT_AMBULATORY_CARE_PROVIDER_SITE_OTHER): Payer: BC Managed Care – PPO | Admitting: Family Medicine

## 2017-07-28 VITALS — BP 143/85 | HR 94 | Wt 237.8 lb

## 2017-07-28 DIAGNOSIS — I10 Essential (primary) hypertension: Secondary | ICD-10-CM | POA: Diagnosis not present

## 2017-07-28 DIAGNOSIS — Z9119 Patient's noncompliance with other medical treatment and regimen: Secondary | ICD-10-CM

## 2017-07-28 DIAGNOSIS — E1169 Type 2 diabetes mellitus with other specified complication: Secondary | ICD-10-CM

## 2017-07-28 DIAGNOSIS — E119 Type 2 diabetes mellitus without complications: Secondary | ICD-10-CM | POA: Insufficient documentation

## 2017-07-28 DIAGNOSIS — E1165 Type 2 diabetes mellitus with hyperglycemia: Secondary | ICD-10-CM | POA: Insufficient documentation

## 2017-07-28 DIAGNOSIS — Z91199 Patient's noncompliance with other medical treatment and regimen due to unspecified reason: Secondary | ICD-10-CM | POA: Insufficient documentation

## 2017-07-28 DIAGNOSIS — E1159 Type 2 diabetes mellitus with other circulatory complications: Secondary | ICD-10-CM

## 2017-07-28 DIAGNOSIS — E559 Vitamin D deficiency, unspecified: Secondary | ICD-10-CM | POA: Diagnosis not present

## 2017-07-28 DIAGNOSIS — I152 Hypertension secondary to endocrine disorders: Secondary | ICD-10-CM

## 2017-07-28 DIAGNOSIS — E1149 Type 2 diabetes mellitus with other diabetic neurological complication: Secondary | ICD-10-CM | POA: Insufficient documentation

## 2017-07-28 MED ORDER — BLOOD GLUCOSE METER KIT: PACK | 0 refills | Status: DC

## 2017-07-28 NOTE — Progress Notes (Signed)
New patient office visit note:  Impression and Recommendations:    1. Type 2 diabetes mellitus with other specified complication, without long-term current use of insulin ([place])   2. Hypertension associated with diabetes ([place])   3. Vitamin D deficiency   4. History of noncompliance with medical treatment   5. Morbidly obese ([place])     1. DM2-  -previously uncontrolled with unknown A1c.  -Check labs today. If kidney function is WNL, we will restart metformin-glyburide in future. 2. Hypertension -BP elevated in office today.  -check your BP at home and keep a log. Bring this into next OV for evaluation. Goal BP: Be below 130/80 on a regular basis. -will await results of CMP. If results are WNl, we will consider starting BP medications in future. 3. Vitamin D deficiency -check labs. 4. H/o noncompliance- -pt has a h/o of not having medical insurance and was not able to see a medical provider during that time. 5. Morbidly obese- -recommend pt to lose weight. Get back into diet and exercise.   -Check labs, adding TSH, T4, direct LDL, and fasting lipid profile. Will await results of kidney function. If results are WNL, we will considering adding BP medication as well as restarting metformin.   -Told pt it is very important to come to the office for routine care of his chronic conditions and health management.    Education and routine counseling performed. Handouts provided.  Orders Placed This Encounter  Procedures  . TSH  . VITAMIN D 25 Hydroxy (Vit-D Deficiency, Fractures)  . Comprehensive metabolic panel  . CBC with Differential/Platelet  . Hemoglobin A1c  . Lipid panel  . T4, free  . Direct LDL    Meds ordered this encounter  Medications  . blood glucose meter kit and supplies    Sig: Dispense based on patient and insurance preference. Use up to four times daily as directed. (FOR ICD-10 E10.9, E11.9).    Dispense:  1 each    Refill:  0    Order Specific Question:    Number of strips    Answer:   100    Order Specific Question:   Number of lancets    Answer:   100    Gross side effects, risk and benefits, and alternatives of medications discussed with patient.  Patient is aware that all medications have potential side effects and we are unable to predict every side effect or drug-drug interaction that may occur.  Expresses verbal understanding and consents to current therapy plan and treatment regimen.  Return in about 1 month (around 08/25/2017) for f/up BP,  BS, and go over labs in 4 wks.  Please see AVS handed out to patient at the end of our visit for further patient instructions/ counseling done pertaining to today's office visit.    Note: This document was prepared using Dragon voice recognition software and may include unintentional dictation errors.  This document serves as a record of services personally performed by Mellody Dance, DO. It was created on her behalf by Mayer Masker, a trained medical scribe. The creation of this record is based on the scribe's personal observations and the provider's statements to them.   I have reviewed the above medical documentation for accuracy and completeness and I concur.  Mellody Dance 07/29/17 10:47 AM  ----------------------------------------------------------------------------------------------------------------------    Subjective:    Chief complaint:   Chief Complaint  Patient presents with  . Establish Care     HPI: Jeffrey Tesch  Rivas is a pleasant 52 y.o. male who presents to [place] [facility] today to review their medical history with me and establish care.   I asked the patient to review their chronic problem list with me to ensure everything was updated and accurate.    All recent office visits with other providers, any medical records that patient brought in etc  - I reviewed today.     We asked pt to get [place] their medical records from [facility] providers/  specialists that they had seen within the past 3-5 years- if they are in private practice and/or do not work for [employer], [facility], [facility], [facility] or [employer] owned practice.  Told them to call their specialists to clarify this if they are not sure.   Personal information He lives in [place], near the clinic.   Other providers His other PCP was at [facility] with Dr. Willey Blade, who switched practices. He had another PCP but is switching today to be closer near his house.   Engineer, structural for 21 years, retired in 2013. Married, one son age 47. Now he works in Engineer, maintenance (IT) (he does not do the physical labor)  PMHx Uncontrolled DM2- pt is currently not on metformin due to an issue with his last PCP. He has not been on metformin in 1 month. Pt was diagnosed in 1992. He states he has been to a dietitian before but feels very ambivalent about it (and has been on the Atkins diet and lost a significant amount of weight- he used to weigh 300+ lbs).  -He reports his A1c has been "great", ranging from 6-8, but he does not know when this last was.   Neuropathy in bilateral feet- since his back surgery in 2010. Across his feet in general. He takes gabapentin as written.   Hernia-   Borderline HTN- never on meds  No h/o HLD.  Liver cirrhosis- diagnosed in 2014. He states this was due to hepatitis C, but he never was positively diagnosed.   5 rib fractures  H/o palpitations/Arrythmia in younger years- negative workup.   Anxiety/depression- not diagnosed.  FMHx  PSHx Back surgery (discectomy L5-S1) in 2010. This caused him to retire early from his job.  Appendectomy (0923-3007)  SHx Smokeless tobacco (current user, 1 can per day) Smoking (10 years, 1ppd- quit 1995) He does not exercise regularly - he does have a [employer].    Wt Readings from Last 3 Encounters:  07/28/17 237 lb 12.8 oz (107.9 kg)  02/17/14 243 lb (110.2 kg)  06/22/12 263 lb 14.3  oz (119.7 kg)   BP Readings from Last 3 Encounters:  07/28/17 (!) 143/85  04/10/14 161/81  04/01/14 163/83   Pulse Readings from Last 3 Encounters:  07/28/17 94  04/10/14 88  04/01/14 89   BMI Readings from Last 3 Encounters:  07/28/17 32.25 kg/m  02/17/14 32.96 kg/m  06/22/12 36.81 kg/m    Patient Care Team    Relationship Specialty Notifications Start End  Mellody Dance, DO PCP - General Family Medicine  07/28/17   Minette Brine  General Practice  07/29/17     Patient Active Problem List   Diagnosis Date Noted  . History of borderline hypertension associated with diabetes ([place]) 07/28/2017    Priority: High  . Diabetes mellitus ([place]) 07/28/2017    Priority: High  . Diabetic neuropathy, painful ([place]) 06/22/2012    Priority: High  . Morbidly obese ([place]) 07/28/2017  Priority: Medium  . Vitamin D deficiency 07/28/2017    Priority: Low  . History of noncompliance with medical treatment 07/28/2017    Priority: Low  . Essential hypertension, benign 06/22/2012  . Pancreatitis, acute 06/22/2012  . Nausea with vomiting 06/22/2012  . Abdominal pain, epigastric 06/22/2012  . Acute hyperkalemia 06/22/2012  . Leukocytosis, unspecified 06/22/2012     Past Medical History:  Diagnosis Date  . Diabetes mellitus without complication ([place])   . Hypertension      Past Medical History:  Diagnosis Date  . Diabetes mellitus without complication ([place])   . Hypertension      Past Surgical History:  Procedure Laterality Date  . APPENDECTOMY    . BACK SURGERY       Family History  Problem Relation Age of Onset  . Cancer Mother   . Diabetes Mother   . Diabetes Father      Social History   Substance and Sexual Activity  Drug Use No     Social History   Substance and Sexual Activity  Alcohol Use Yes  . Alcohol/week: 0.0 oz   Comment: 3 drinks a month     Social History   Tobacco Use  Smoking Status Former Smoker  . Last attempt to quit: 06/23/1995    . Years since quitting: 22.1  Smokeless Tobacco Current User  . Types: Chew     Current Meds  Medication Sig  . gabapentin (NEURONTIN) 600 MG tablet Take 600 mg by mouth 3 (three) times daily.    Allergies: Patient has no known allergies.   Review of Systems  Constitutional: Negative for chills, diaphoresis, fever, malaise/fatigue and weight loss.  HENT: Negative for congestion, sore throat and tinnitus.   Eyes: Negative for blurred vision, double vision and photophobia.  Respiratory: Negative for cough and wheezing.   Cardiovascular: Negative for chest pain and palpitations.  Gastrointestinal: Negative for blood in stool, diarrhea, nausea and vomiting.  Genitourinary: Negative for dysuria, frequency and urgency.  Musculoskeletal: Negative for joint pain and myalgias.  Skin: Negative for itching and rash.  Neurological: Positive for tingling. Negative for dizziness, focal weakness, weakness and headaches.  Endo/Heme/Allergies: Negative for environmental allergies and polydipsia. Does not bruise/bleed easily.  Psychiatric/Behavioral: Negative for depression and memory loss. The patient is not nervous/anxious and does not have insomnia.      Objective:   Blood pressure (!) 143/85, pulse 94, weight 237 lb 12.8 oz (107.9 kg), SpO2 100 %. Body mass index is 32.25 kg/m. General: Well Developed, well nourished, and in no acute distress.  Neuro: Alert and oriented x3, extra-ocular muscles intact, sensation grossly intact.  HEENT:[place]/AT, PERRLA, neck supple, No carotid bruits Skin: no gross rashes  Cardiac: Regular rate and rhythm Respiratory: Essentially clear to auscultation bilaterally. Not using accessory muscles, speaking in full sentences.  Abdominal: not grossly distended Musculoskeletal: Ambulates w/o diff, FROM * 4 ext.  Vasc: less 2 sec cap RF, warm and pink  Psych:  No HI/SI, judgement and insight good, Euthymic mood. Flat, blunted affect.    Recent Results (from  the past 2160 hour(s))  TSH     Status: Abnormal   Collection Time: 07/28/17  5:02 PM  Result Value Ref Range   TSH 0.342 (L) 0.450 - 4.500 uIU/mL  VITAMIN D 25 Hydroxy (Vit-D Deficiency, Fractures)     Status: Abnormal   Collection Time: 07/28/17  5:02 PM  Result Value Ref Range   Vit D, 25-Hydroxy 29.4 (L) 30.0 - 100.0 ng/mL  Comment: Vitamin D deficiency has been defined by the [place] practice guideline as a level of serum 25-OH vitamin D less than 20 ng/mL (1,2). The Endocrine Society went on to further define vitamin D insufficiency as a level between 21 and 29 ng/mL (2). 1. IOM (Institute of Medicine). 2010. Dietary reference    intakes for calcium and D. [place]: The    [employer]. 2. Holick MF, Binkley Truxton, Bischoff-Ferrari HA, et al.    Evaluation, treatment, and prevention of vitamin D    deficiency: an Endocrine Society clinical practice    guideline. JCEM. 2011 Jul; 96(7):1911-30.   Comprehensive metabolic panel     Status: Abnormal   Collection Time: 07/28/17  5:02 PM  Result Value Ref Range   Glucose 156 (H) 65 - 99 mg/dL   BUN 6 6 - 24 mg/dL   Creatinine, Ser 1.00 0.76 - 1.27 mg/dL   GFR calc non Af Amer 88 >59 mL/min/1.73   GFR calc Af Amer 102 >59 mL/min/1.73   BUN/Creatinine Ratio 6 (L) 9 - 20   Sodium 138 134 - 144 mmol/L   Potassium 4.7 3.5 - 5.2 mmol/L   Chloride 100 96 - 106 mmol/L   CO2 25 20 - 29 mmol/L   Calcium 9.5 8.7 - 10.2 mg/dL   Total Protein 7.8 6.0 - 8.5 g/dL   Albumin 3.8 3.5 - 5.5 g/dL   Globulin, Total 4.0 1.5 - 4.5 g/dL   Albumin/Globulin Ratio 1.0 (L) 1.2 - 2.2   Bilirubin Total 0.7 0.0 - 1.2 mg/dL   Alkaline Phosphatase 114 39 - 117 IU/L   AST 26 0 - 40 IU/L   ALT 24 0 - 44 IU/L  CBC with Differential/Platelet     Status: Abnormal   Collection Time: 07/28/17  5:02 PM  Result Value Ref Range   WBC 7.7 3.4 - 10.8 x10E3/uL   RBC 4.37 4.14 - 5.80 x10E6/uL   Hemoglobin 13.7  13.0 - 17.7 g/dL   Hematocrit 39.4 37.5 - 51.0 %   MCV 90 79 - 97 fL   MCH 31.4 26.6 - 33.0 pg   MCHC 34.8 31.5 - 35.7 g/dL   RDW 13.5 12.3 - 15.4 %   Platelets 114 (L) 150 - 379 x10E3/uL   Neutrophils 71 Not Estab. %   Lymphs 18 Not Estab. %   Monocytes 8 Not Estab. %   Eos 3 Not Estab. %   Basos 0 Not Estab. %   Neutrophils Absolute 5.6 1.4 - 7.0 x10E3/uL   Lymphocytes Absolute 1.4 0.7 - 3.1 x10E3/uL   Monocytes Absolute 0.6 0.1 - 0.9 x10E3/uL   EOS (ABSOLUTE) 0.2 0.0 - 0.4 x10E3/uL   Basophils Absolute 0.0 0.0 - 0.2 x10E3/uL   Immature Granulocytes 0 Not Estab. %   Immature Grans (Abs) 0.0 0.0 - 0.1 x10E3/uL  Hemoglobin A1c     Status: Abnormal   Collection Time: 07/28/17  5:02 PM  Result Value Ref Range   Hgb A1c MFr Bld 7.2 (H) 4.8 - 5.6 %    Comment:          Prediabetes: 5.7 - 6.4          Diabetes: >6.4          Glycemic control for adults with diabetes: <7.0    Est. average glucose Bld gHb Est-mCnc 160 mg/dL  Lipid panel     Status: Abnormal   Collection Time: 07/28/17  5:02 PM  Result Value Ref Range   Cholesterol, Total 138 100 - 199 mg/dL   Triglycerides 72 0 - 149 mg/dL   HDL 33 (L) >39 mg/dL   VLDL Cholesterol Cal 14 5 - 40 mg/dL   LDL Calculated 91 0 - 99 mg/dL   Chol/HDL Ratio 4.2 0.0 - 5.0 ratio    Comment:                                   T. Chol/HDL Ratio                                             Men  Women                               1/2 Avg.Risk  3.4    3.3                                   Avg.Risk  5.0    4.4                                2X Avg.Risk  9.6    7.1                                3X Avg.Risk 23.4   11.0   T4, free     Status: None   Collection Time: 07/28/17  5:02 PM  Result Value Ref Range   Free T4 0.93 0.82 - 1.77 ng/dL  Direct LDL     Status: None   Collection Time: 07/28/17  5:02 PM  Result Value Ref Range   LDL Direct 99 0 - 99 mg/dL

## 2017-07-28 NOTE — Patient Instructions (Addendum)
Check your BP at home, randomly throughout the day.  Check Bp 2 times daily.   Your goal BP should be consistently <130/80 on a regular basis.   We are checking your kidney function today. If serum creatinine less than 1.2 and GFR is okay, we will refill metformin at 1000 mg BID twice daily.    Follow up in 1 month to discuss labs and possibly go on additional meds for your medical issues.  ( Bp or chol as needed)   Since your preference is that we just call you for abnormal labs and start new meds if needed, we will reach out to let you know in the future if this is applicable.  Then will follow-up in 1 month office visit to discuss how you are doing on them.     Diabetes Mellitus and Standards of Medical Care  Managing diabetes (diabetes mellitus) can be complicated. Your diabetes treatment may be managed by a team of health care providers, including:  A diet and nutrition specialist (registered dietitian).  A nurse.  A certified diabetes educator (CDE).  A diabetes specialist (endocrinologist).  An eye doctor.  A primary care provider.  A dentist.  Your health care providers follow a schedule in order to help you get the best quality of care. The following schedule is a general guideline for your diabetes management plan. Your health care providers may also give you more specific instructions.  HbA1c (hemoglobin A1c) test This test provides information about blood sugar (glucose) control over the previous 2-3 months. It is used to check whether your diabetes management plan needs to be adjusted.  If you are meeting your treatment goals, this test is done at least 2 times a year.  If you are not meeting treatment goals or if your treatment goals have changed, this test is done 4 times a year.  Blood pressure test  This test is done at every routine medical visit. For most people, the goal is less than 130/80. Ask your health care provider what your goal blood pressure  should be.  Dental and eye exams  Visit your dentist two times a year.  If you have type 1 diabetes, get an eye exam 3-5 years after you are diagnosed, and then once a year after your first exam. ? If you were diagnosed with type 1 diabetes as a child, get an eye exam when you are age 70 or older and have had diabetes for 3-5 years. After the first exam, you should get an eye exam once a year.  If you have type 2 diabetes, have an eye exam as soon as you are diagnosed, and then once a year after your first exam.  Foot care exam  Visual foot exams are done at every routine medical visit. The exams check for cuts, bruises, redness, blisters, sores, or other problems with the feet.  A complete foot exam is done by your health care provider once a year. This exam includes an inspection of the structure and skin of your feet, and a check of the pulses and sensation in your feet. ? Type 1 diabetes: Get your first exam 3-5 years after diagnosis. ? Type 2 diabetes: Get your first exam as soon as you are diagnosed.  Check your feet every day for cuts, bruises, redness, blisters, or sores. If you have any of these or other problems that are not healing, contact your health care provider.  Kidney function test (urine microalbumin)  This test is  done once a year. ? Type 1 diabetes: Get your first test 5 years after diagnosis. ? Type 2 diabetes: Get your first test as soon as you are diagnosed._  If you have chronic kidney disease (CKD), get a serum creatinine and estimated glomerular filtration rate (eGFR) test once a year.  Lipid profile (cholesterol, HDL, LDL, triglycerides)  This test should be done when you are diagnosed with diabetes, and every 5 years after the first test. If you are on medicines to lower your cholesterol, you may need to get this test done every year. ? The goal for LDL is less than 100 mg/dL (5.5 mmol/L). If you are at high risk, the goal is less than 70 mg/dL (3.9  mmol/L). ? The goal for HDL is 40 mg/dL (2.2 mmol/L) for men and 50 mg/dL(2.8 mmol/L) for women. An HDL cholesterol of 60 mg/dL (3.3 mmol/L) or higher gives some protection against heart disease. ? The goal for triglycerides is less than 150 mg/dL (8.3 mmol/L).  Immunizations  The yearly flu (influenza) vaccine is recommended for everyone 6 months or older who has diabetes.  The pneumonia (pneumococcal) vaccine is recommended for everyone 2 years or older who has diabetes. If you are 78 or older, you may get the pneumonia vaccine as a series of two separate shots.  The hepatitis B vaccine is recommended for adults shortly after they have been diagnosed with diabetes.  The Tdap (tetanus, diphtheria, and pertussis) vaccine should be given: ? According to normal childhood vaccination schedules, for children. ? Every 10 years, for adults who have diabetes.  The shingles vaccine is recommended for people who have had chicken pox and are 50 years or older.  Mental and emotional health  Screening for symptoms of eating disorders, anxiety, and depression is recommended at the time of diagnosis and afterward as needed. If your screening shows that you have symptoms (you have a positive screening result), you may need further evaluation and be referred to a mental health care provider.  Diabetes self-management education  Education about how to manage your diabetes is recommended at diagnosis and ongoing as needed.  Treatment plan  Your treatment plan will be reviewed at every medical visit.  Summary  Managing diabetes (diabetes mellitus) can be complicated. Your diabetes treatment may be managed by a team of health care providers.  Your health care providers follow a schedule in order to help you get the best quality of care.  Standards of care including having regular physical exams, blood tests, blood pressure monitoring, immunizations, screening tests, and education about how to manage  your diabetes.  Your health care providers may also give you more specific instructions based on your individual health.      Type 2 Diabetes Mellitus, Self Care, Adult Caring for yourself after you have been diagnosed with type 2 diabetes (type 2 diabetes mellitus) means keeping your blood sugar (glucose) under control with a balance of:  Nutrition.  Exercise.  Lifestyle changes.  Medicines or insulin, if necessary.  Support from your team of health care providers and others.  The following information explains what you need to know to manage your diabetes at home. What do I need to do to manage my blood glucose?  Check your blood glucose every day, as often as told by your health care provider.  Contact your health care provider if your blood glucose is above your target for 2 tests in a row.  Have your A1c (hemoglobin A1c) level checked at  least two times a year, or as often as told by your health care provider. Your health care provider will set individualized treatment goals for you. Generally, the goal of treatment is to maintain the following blood glucose levels:  Before meals (preprandial): 80-130 mg/dL (4.4-7.2 mmol/L).  After meals (postprandial): below 180 mg/dL (10 mmol/L).  A1c level: less than 7%.  What do I need to know about hyperglycemia and hypoglycemia? What is hyperglycemia? Hyperglycemia, also called high blood glucose, occurs when blood glucose is too high.Make sure you know the early signs of hyperglycemia, such as:  Increased thirst.  Hunger.  Feeling very tired.  Needing to urinate more often than usual.  Blurry vision.  What is hypoglycemia? Hypoglycemia, also called low blood glucose, occurswith a blood glucose level at or below 70 mg/dL (3.9 mmol/L). The risk for hypoglycemia increases during or after exercise, during sleep, during illness, and when skipping meals or not eating for a long time (fasting). It is important to know the  symptoms of hypoglycemia and treat it right away. Always have a 15-gram rapid-acting carbohydrate snack with you to treat low blood glucose. Family members and close friends should also know the symptoms and should understand how to treat hypoglycemia, in case you are not able to treat yourself. What are the symptoms of hypoglycemia? Hypoglycemia symptoms can include:  Hunger.  Anxiety.  Sweating and feeling clammy.  Confusion.  Dizziness or feeling light-headed.  Sleepiness.  Nausea.  Increased heart rate.  Headache.  Blurry vision.  Seizure.  Nightmares.  Tingling or numbness around the mouth, lips, or tongue.  A change in speech.  Decreased ability to concentrate.  A change in coordination.  Restless sleep.  Tremors or shakes.  Fainting.  Irritability.  How do I treat hypoglycemia?  If you are alert and able to swallow safely, follow the 15:15 rule:  Take 15 grams of a rapid-acting carbohydrate. Rapid-acting options include: ? 1 tube of glucose gel. ? 3 glucose pills. ? 6-8 pieces of hard candy. ? 4 oz (120 mL) of fruit juice. ? 4 oz (120 mL) of regular (not diet) soda.  Check your blood glucose 15 minutes after you take the carbohydrate.  If the repeat blood glucose level is still at or below 70 mg/dL (3.9 mmol/L), take 15 grams of a carbohydrate again.  If your blood glucose level does not increase above 70 mg/dL (3.9 mmol/L) after 3 tries, seek emergency medical care.  After your blood glucose level returns to normal, eat a meal or a snack within 1 hour.  How do I treat severe hypoglycemia? Severe hypoglycemia is when your blood glucose level is at or below 54 mg/dL (3 mmol/L). Severe hypoglycemia is an emergency. Do not wait to see if the symptoms will go away. Get medical help right away. Call your local emergency services (911 in the [place] Do not drive yourself to the hospital. If you have severe hypoglycemia and you cannot eat or drink, you  may need an injection of glucagon. A family member or close friend should learn how to check your blood glucose and how to give you a glucagon injection. Ask your health care provider if you need to have an emergency glucagon injection kit available. Severe hypoglycemia may need to be treated in a hospital. The treatment may include getting glucose through an IV tube. You may also need treatment for the cause of your hypoglycemia. Can having diabetes put me at risk for other conditions? Having diabetes can  put you at risk for other long-term (chronic) conditions, such as heart disease and kidney disease. Your health care provider may prescribe medicines to help prevent complications from diabetes. These medicines may include:  Aspirin.  Medicine to lower cholesterol.  Medicine to control blood pressure.  What else can I do to manage my diabetes? Take your diabetes medicines as told  If your health care provider prescribed insulin or diabetes medicines, take them every day.  Do not run out of insulin or other diabetes medicines that you take. Plan ahead so you always have these available.  If you use insulin, adjust your dosage based on how physically active you are and what foods you eat. Your health care provider will tell you how to adjust your dosage. Make healthy food choices  The things that you eat and drink affect your blood glucose and your insulin dosage. Making good choices helps to control your diabetes and prevent other health problems. A healthy meal plan includes eating lean proteins, complex carbohydrates, fresh fruits and vegetables, low-fat dairy products, and healthy fats. Make an appointment to see a diet and nutrition specialist (registered dietitian) to help you create an eating plan that is right for you. Make sure that you:  Follow instructions from your health care provider about eating or drinking restrictions.  Drink enough fluid to keep your urine clear or pale  yellow.  Eat healthy snacks between nutritious meals.  Track the carbohydrates that you eat. Do this by reading food labels and learning the standard serving sizes of foods.  Follow your sick day plan whenever you cannot eat or drink as usual. Make this plan in advance with your health care provider.  Stay active  Exercise regularly, as told by your health care provider. This may include:  Stretching and doing strength exercises, such as yoga or weightlifting, at least 2 times a week.  Doing at least 150 minutes of moderate-intensity or vigorous-intensity exercise each week. This could be brisk walking, biking, or water aerobics. ? Spread out your activity over at least 3 days of the week. ? Do not go more than 2 days in a row without doing some kind of physical activity.  When you start a new exercise or activity, work with your health care provider to adjust your insulin, medicines, or food intake as needed. Make healthy lifestyle choices  Do not use any tobacco products, such as cigarettes, chewing tobacco, and e-cigarettes. If you need help quitting, ask your health care provider.  If your health care provider says that alcohol is safe for you, limit alcohol intake to no more than 1 drink per day for nonpregnant women and 2 drinks per day for men. One drink equals 12 oz of beer, 5 oz of wine, or 1 oz of hard liquor.  Learn to manage stress. If you need help with this, ask your health care provider. Care for your body   Keep your immunizations up to date. In addition to getting vaccinations as told by your health care provider, it is recommended that you get vaccinated against the following illnesses: ? The flu (influenza). Get a flu shot every year. ? Pneumonia. ? Hepatitis B.  Schedule an eye exam soon after your diagnosis, and then one time every year after that.  Check your skin and feet every day for cuts, bruises, redness, blisters, or sores. Schedule a foot exam with  your health care provider once every year.  Brush your teeth and gums two times a  day, and floss at least one time a day. Visit your dentist at least once every 6 months.  Maintain a healthy weight. General instructions  Take over-the-counter and prescription medicines only as told by your health care provider.  Share your diabetes management plan with people in your workplace, school, and household.  Check your urine for ketones when you are ill and as told by your health care provider.  Ask your health care provider: ? Do I need to meet with a diabetes educator? ? Where can I find a support group for people with diabetes?  Carry a medical alert card or wear medical alert jewelry.  Keep all follow-up visits as told by your health care provider. This is important. Where to find more information: For more information about diabetes, visit:  American Diabetes Association (ADA): www.diabetes.org  American Association of Diabetes Educators (AADE): www.diabeteseducator.org/patient-resources  This information is not intended to replace advice given to you by your health care provider. Make sure you discuss any questions you have with your health care provider. Document Released: 08/11/2015 Document Revised: 09/25/2015 Document Reviewed: 05/23/2015 Elsevier Interactive Patient Education  2017 [place].      Blood Glucose Monitoring, Adult Monitoring your blood sugar (glucose) helps you manage your diabetes. It also helps you and your health care provider determine how well your diabetes management plan is working. Blood glucose monitoring involves checking your blood glucose as often as directed, and keeping a record (log) of your results over time. Why should I monitor my blood glucose? Checking your blood glucose regularly can:  Help you understand how food, exercise, illnesses, and medicines affect your blood glucose.  Let you know what your blood glucose is at any time.  You can quickly tell if you are having low blood glucose (hypoglycemia) or high blood glucose (hyperglycemia).  Help you and your health care provider adjust your medicines as needed.  When should I check my blood glucose? Follow instructions from your health care provider about how often to check your blood glucose.   This may depend on:  The type of diabetes you have.  How well-controlled your diabetes is.  Medicines you are taking.  If you have type 1 diabetes:  Check your blood glucose at least 2 times a day.  Also check your blood glucose: ? Before every insulin injection. ? Before and after exercise. ? Between meals. ? 2 hours after a meal. ? Occasionally between 2:00 a.m. and 3:00 a.m., as directed. ? Before potentially dangerous tasks, like driving or using heavy machinery. ? At bedtime.  You may need to check your blood glucose more often, up to 6-10 times a day: ? If you use an insulin pump. ? If you need multiple daily injections (MDI). ? If your diabetes is not well-controlled. ? If you are ill. ? If you have a history of severe hypoglycemia. ? If you have a history of not knowing when your blood glucose is getting low (hypoglycemia unawareness).  If you have type 2 diabetes:  If you take insulin or other diabetes medicines, check your blood glucose at least 2 times a day.  If you are on intensive insulin therapy, check your blood glucose at least 4 times a day. Occasionally, you may also need to check between 2:00 a.m. and 3:00 a.m., as directed.  Also check your blood glucose: ? Before and after exercise. ? Before potentially dangerous tasks, like driving or using heavy machinery.  You may need to check your blood  glucose more often if: ? Your medicine is being adjusted. ? Your diabetes is not well-controlled. ? You are ill.  What is a blood glucose log?  A blood glucose log is a record of your blood glucose readings. It helps you and your health care  provider: ? Look for patterns in your blood glucose over time. ? Adjust your diabetes management plan as needed.  Every time you check your blood glucose, write down your result and notes about things that may be affecting your blood glucose, such as your diet and exercise for the day.  Most glucose meters store a record of glucose readings in the meter. Some meters allow you to download your records to a computer. How do I check my blood glucose? Follow these steps to get accurate readings of your blood glucose: Supplies needed   Blood glucose meter.  Test strips for your meter. Each meter has its own strips. You must use the strips that come with your meter.  A needle to prick your finger (lancet). Do not use lancets more than once.  A device that holds the lancet (lancing device).  A journal or log book to write down your results.  Procedure  Wash your hands with soap and water.  Prick the side of your finger (not the tip) with the lancet. Use a different finger each time.  Gently rub the finger until a small drop of blood appears.  Follow instructions that come with your meter for inserting the test strip, applying blood to the strip, and using your blood glucose meter.  Write down your result and any notes.  Alternative testing sites  Some meters allow you to use areas of your body other than your finger (alternative sites) to test your blood.  If you think you may have hypoglycemia, or if you have hypoglycemia unawareness, do not use alternative sites. Use your finger instead.  Alternative sites may not be as accurate as the fingers, because blood flow is slower in these areas. This means that the result you get may be delayed, and it may be different from the result that you would get from your finger.  The most common alternative sites are: ? Forearm. ? Thigh. ? Palm of the hand.  Additional tips  Always keep your supplies with you.  If you have questions or  need help, all blood glucose meters have a 24-hour hotline number that you can call. You may also contact your health care provider.  After you use a few boxes of test strips, adjust (calibrate) your blood glucose meter by following instructions that came with your meter.    The American Diabetes Association suggests the following targets for most nonpregnant adults with diabetes.  More or less stringent glycemic goals may be appropriate for each individual.  A1C: Less than 7% A1C may also be reported as eAG: Less than 154 mg/dl Before a meal (preprandial plasma glucose): 80-130 mg/dl 1-2 hours after beginning of the meal (Postprandial plasma glucose)*: Less than 180 mg/dl  *Postprandial glucose may be targeted if A1C goals are not met despite reaching preprandial glucose goals.   GOALS in short:  The goals are for the Hgb A1C to be less than 7.0 & blood pressure to be less than 130/80.    It is recommended that all diabetics are educated on and follow a healthy diabetic diet, exercise for 30 minutes 3-4 times per week (walking, biking, swimming, or machine), monitor blood glucose readings and bring that record  with you to be reviewed at your next office visit.     You should be checking fasting blood sugars- especially after you eat poorly or eat really healthy, and also check 2 hour postprandial blood sugars after largest meal of the day.    Write these down and bring in your log at each office visit.    You will need to be seen every 3 months by the provider managing your Diabetes unless told otherwise by that provider.   You will need yearly eye exams from an eye specialist and foot exams to check the nerves of your feet.  Also, your urine should be checked yearly as well to make sure excess protein is not present.   If you are checking your blood pressure at home, please record it and bring it to your next office visit.    Follow the Dietary Approaches to Stop Hypertension (DASH)  diet (3 servings of fruit and vegetables daily, whole grains, low sodium, low-fat proteins).  See below.    Lastly, when it comes to your cholesterol, the goal is to have the HDL (good cholesterol) >40, and the LDL (bad cholesterol) <100.   It is recommended that you follow a heart healthy, low saturated and trans-fat diet and exercise for 30 minutes at least 5 times a week.     (( Check out the DASH diet = 1.5 Gram Low Sodium Diet   A 1.5 gram sodium diet restricts the amount of sodium in the diet to no more than 1.5 g or 1500 mg daily.  The American Heart Association recommends Americans over the age of 59 to consume no more than 1500 mg of sodium each day to reduce the risk of developing high blood pressure.  Research also shows that limiting sodium may reduce heart attack and stroke risk.  Many foods contain sodium for flavor and sometimes as a preservative.  When the amount of sodium in a diet needs to be low, it is important to know what to look for when choosing foods and drinks.  The following includes some information and guidelines to help make it easier for you to adapt to a low sodium diet.    QUICK TIPS  Do not add salt to food.  Avoid convenience items and fast food.  Choose unsalted snack foods.  Buy lower sodium products, often labeled as "lower sodium" or "no salt added."  Check food labels to learn how much sodium is in 1 serving.  When eating at a restaurant, ask that your food be prepared with less salt or none, if possible.    READING FOOD LABELS FOR SODIUM INFORMATION  The nutrition facts label is a good place to find how much sodium is in foods. Look for products with no more than 400 mg of sodium per serving.  Remember that 1.5 g = 1500 mg.  The food label may also list foods as:  Sodium-free: Less than 5 mg in a serving.  Very low sodium: 35 mg or less in a serving.  Low-sodium: 140 mg or less in a serving.  Light in sodium: 50% less sodium in a serving. For  example, if a food that usually has 300 mg of sodium is changed to become light in sodium, it will have 150 mg of sodium.  Reduced sodium: 25% less sodium in a serving. For example, if a food that usually has 400 mg of sodium is changed to reduced sodium, it will have 300 mg of sodium.  CHOOSING FOODS  Grains  Avoid: Salted crackers and snack items. Some cereals, including instant hot cereals. Bread stuffing and biscuit mixes. Seasoned rice or pasta mixes.  Choose: Unsalted snack items. Low-sodium cereals, oats, puffed wheat and rice, shredded wheat. English muffins and bread. Pasta.  Meats  Avoid: Salted, canned, smoked, spiced, pickled meats, including fish and poultry. Bacon, ham, sausage, cold cuts, hot dogs, anchovies.  Choose: Low-sodium canned tuna and salmon. Fresh or frozen meat, poultry, and fish.  Dairy  Avoid: Processed cheese and spreads. Cottage cheese. Buttermilk and condensed milk. Regular cheese.  Choose: Milk. Low-sodium cottage cheese. Yogurt. Sour cream. Low-sodium cheese.  Fruits and Vegetables  Avoid: Regular canned vegetables. Regular canned tomato sauce and paste. Frozen vegetables in sauces. Olives. Angie Fava. Relishes. Sauerkraut.  Choose: Low-sodium canned vegetables. Low-sodium tomato sauce and paste. Frozen or fresh vegetables. Fresh and frozen fruit.  Condiments  Avoid: Canned and packaged gravies. Worcestershire sauce. Tartar sauce. Barbecue sauce. Soy sauce. Steak sauce. Ketchup. Onion, garlic, and table salt. Meat flavorings and tenderizers.  Choose: Fresh and dried herbs and spices. Low-sodium varieties of mustard and ketchup. Lemon juice. Tabasco sauce. Horseradish.    SAMPLE 1.5 GRAM SODIUM MEAL PLAN:   Breakfast / Sodium (mg)  1 cup low-fat milk / 143 mg  1 whole-wheat English muffin / 240 mg  1 tbs heart-healthy margarine / 153 mg  1 hard-boiled egg / 139 mg  1 small orange / 0 mg  Lunch / Sodium (mg)  1 cup raw carrots / 76 mg  2 tbs no salt added  peanut butter / 5 mg  2 slices whole-wheat bread / 270 mg  1 tbs jelly / 6 mg   cup red grapes / 2 mg  Dinner / Sodium (mg)  1 cup whole-wheat pasta / 2 mg  1 cup low-sodium tomato sauce / 73 mg  3 oz lean ground beef / 57 mg  1 small side salad (1 cup raw spinach leaves,  cup cucumber,  cup yellow bell pepper) with 1 tsp olive oil and 1 tsp red wine vinegar / 25 mg  Snack / Sodium (mg)  1 container low-fat vanilla yogurt / 107 mg  3 graham cracker squares / 127 mg  Nutrient Analysis  Calories: 1745  Protein: 75 g  Carbohydrate: 237 g  Fat: 57 g  Sodium: 1425 mg  Document Released: 04/19/2005 Document Revised: 12/30/2010 Document Reviewed: 07/21/2009  ExitCare Patient Information 2012 [place].))    This information is not intended to replace advice given to you by your health care provider. Make sure you discuss any questions you have with your health care provider. Document Released: 04/22/2003 Document Revised: 11/07/2015 Document Reviewed: 09/29/2015 Elsevier Interactive Patient Education  2017 [employer].

## 2017-07-29 ENCOUNTER — Telehealth: Payer: Self-pay | Admitting: Family Medicine

## 2017-07-29 LAB — LIPID PANEL
Chol/HDL Ratio: 4.2 ratio (ref 0.0–5.0)
Cholesterol, Total: 138 mg/dL (ref 100–199)
HDL: 33 mg/dL — ABNORMAL LOW (ref >39)
LDL Calculated: 91 mg/dL (ref 0–99)
Triglycerides: 72 mg/dL (ref 0–149)
VLDL Cholesterol Cal: 14 mg/dL (ref 5–40)

## 2017-07-29 LAB — CBC WITH DIFFERENTIAL/PLATELET
Basophils Absolute: 0 10*3/uL (ref 0.0–0.2)
Basos: 0 %
EOS (ABSOLUTE): 0.2 10*3/uL (ref 0.0–0.4)
Eos: 3 %
Hematocrit: 39.4 % (ref 37.5–51.0)
Hemoglobin: 13.7 g/dL (ref 13.0–17.7)
Immature Grans (Abs): 0 10*3/uL (ref 0.0–0.1)
Immature Granulocytes: 0 %
Lymphocytes Absolute: 1.4 10*3/uL (ref 0.7–3.1)
Lymphs: 18 %
MCH: 31.4 pg (ref 26.6–33.0)
MCHC: 34.8 g/dL (ref 31.5–35.7)
MCV: 90 fL (ref 79–97)
Monocytes Absolute: 0.6 10*3/uL (ref 0.1–0.9)
Monocytes: 8 %
Neutrophils Absolute: 5.6 10*3/uL (ref 1.4–7.0)
Neutrophils: 71 %
Platelets: 114 10*3/uL — ABNORMAL LOW (ref 150–379)
RBC: 4.37 x10E6/uL (ref 4.14–5.80)
RDW: 13.5 % (ref 12.3–15.4)
WBC: 7.7 10*3/uL (ref 3.4–10.8)

## 2017-07-29 LAB — HEMOGLOBIN A1C
Est. average glucose Bld gHb Est-mCnc: 160 mg/dL
Hgb A1c MFr Bld: 7.2 % — ABNORMAL HIGH (ref 4.8–5.6)

## 2017-07-29 LAB — COMPREHENSIVE METABOLIC PANEL
ALT: 24 IU/L (ref 0–44)
AST: 26 IU/L (ref 0–40)
Albumin/Globulin Ratio: 1 — ABNORMAL LOW (ref 1.2–2.2)
Albumin: 3.8 g/dL (ref 3.5–5.5)
Alkaline Phosphatase: 114 IU/L (ref 39–117)
BUN/Creatinine Ratio: 6 — ABNORMAL LOW (ref 9–20)
BUN: 6 mg/dL (ref 6–24)
Bilirubin Total: 0.7 mg/dL (ref 0.0–1.2)
CO2: 25 mmol/L (ref 20–29)
Calcium: 9.5 mg/dL (ref 8.7–10.2)
Chloride: 100 mmol/L (ref 96–106)
Creatinine, Ser: 1 mg/dL (ref 0.76–1.27)
GFR calc Af Amer: 102 mL/min/{1.73_m2} (ref >59)
GFR calc non Af Amer: 88 mL/min/{1.73_m2} (ref >59)
Globulin, Total: 4 g/dL (ref 1.5–4.5)
Glucose: 156 mg/dL — ABNORMAL HIGH (ref 65–99)
Potassium: 4.7 mmol/L (ref 3.5–5.2)
Sodium: 138 mmol/L (ref 134–144)
Total Protein: 7.8 g/dL (ref 6.0–8.5)

## 2017-07-29 LAB — T4, FREE: Free T4: 0.93 ng/dL (ref 0.82–1.77)

## 2017-07-29 LAB — LDL CHOLESTEROL, DIRECT: LDL Direct: 99 mg/dL (ref 0–99)

## 2017-07-29 LAB — VITAMIN D 25 HYDROXY (VIT D DEFICIENCY, FRACTURES): Vit D, 25-Hydroxy: 29.4 ng/mL — ABNORMAL LOW (ref 30.0–100.0)

## 2017-07-29 LAB — TSH: TSH: 0.342 u[IU]/mL — ABNORMAL LOW (ref 0.450–4.500)

## 2017-07-29 NOTE — Telephone Encounter (Signed)
Patient called back for lab results, told him when the clinic staff returns Monday they will call with results

## 2017-08-01 ENCOUNTER — Other Ambulatory Visit: Payer: Self-pay

## 2017-08-01 MED ORDER — METFORMIN HCL 500 MG PO TABS: 1000.0000 mg | ORAL_TABLET | Freq: Two times a day (BID) | ORAL | 0 refills | Status: DC

## 2017-08-01 MED ORDER — ERGOCALCIFEROL 1.25 MG (50000 UT) PO CAPS: 50000.0000 [IU] | ORAL_CAPSULE | ORAL | 1 refills | Status: DC

## 2017-08-01 NOTE — Telephone Encounter (Signed)
Pt informed of results.  Pt expressed understanding and is agreeable.  T. Nelson, CMA 

## 2017-08-22 ENCOUNTER — Telehealth: Payer: Self-pay | Admitting: Family Medicine

## 2017-08-22 ENCOUNTER — Other Ambulatory Visit: Payer: Self-pay | Admitting: Family Medicine

## 2017-08-22 MED ORDER — GABAPENTIN 600 MG PO TABS: 600.0000 mg | ORAL_TABLET | Freq: Three times a day (TID) | ORAL | 1 refills | Status: DC

## 2017-08-22 NOTE — Telephone Encounter (Signed)
Patient's are requesting that prescriptions going forward be sent to [facility] on [address]. I sent another message earlier about his gabapentin request, please disregard [facility] and send to this [facility]

## 2017-08-22 NOTE — Telephone Encounter (Signed)
We have not prescribed these medications for the patient previously.  Please review and refill if appropriate.  T. Nelson, CMA  

## 2017-08-22 NOTE — Telephone Encounter (Signed)
Patient went to pick up his refills and did not get one for his gabapentin and is requesting this med to be refilled and sent to [facility]. He also wants to discuss the glucose meter options. The one that was sent in he described as the "old timey" ones and doesn't want that. Please advise

## 2017-08-22 NOTE — Addendum Note (Signed)
Addended by: Fonnie Mu on: 08/22/2017 04:02 PM   Modules accepted: Orders

## 2017-09-02 ENCOUNTER — Ambulatory Visit: Payer: BC Managed Care – PPO | Admitting: Family Medicine

## 2017-11-16 ENCOUNTER — Ambulatory Visit: Payer: BC Managed Care – PPO | Admitting: Family Medicine

## 2017-11-16 VITALS — BP 136/72 | HR 73 | Ht 72.0 in | Wt 235.0 lb

## 2017-11-16 DIAGNOSIS — E782 Mixed hyperlipidemia: Secondary | ICD-10-CM

## 2017-11-16 DIAGNOSIS — E1169 Type 2 diabetes mellitus with other specified complication: Secondary | ICD-10-CM | POA: Diagnosis not present

## 2017-11-16 DIAGNOSIS — E1142 Type 2 diabetes mellitus with diabetic polyneuropathy: Secondary | ICD-10-CM | POA: Insufficient documentation

## 2017-11-16 DIAGNOSIS — E1159 Type 2 diabetes mellitus with other circulatory complications: Secondary | ICD-10-CM | POA: Diagnosis not present

## 2017-11-16 DIAGNOSIS — I10 Essential (primary) hypertension: Secondary | ICD-10-CM | POA: Diagnosis not present

## 2017-11-16 DIAGNOSIS — I152 Hypertension secondary to endocrine disorders: Secondary | ICD-10-CM

## 2017-11-16 LAB — POCT GLYCOSYLATED HEMOGLOBIN (HGB A1C): Hemoglobin A1C: 6.1 % — AB (ref 4.0–5.6)

## 2017-11-16 LAB — POCT UA - MICROALBUMIN
Creatinine, POC: 200 mg/dL
Microalbumin Ur, POC: 80 mg/L

## 2017-11-16 MED ORDER — METFORMIN HCL 500 MG PO TABS: 500.0000 mg | ORAL_TABLET | Freq: Two times a day (BID) | ORAL | 1 refills | Status: DC

## 2017-11-16 MED ORDER — LOSARTAN POTASSIUM 50 MG PO TABS: 50.0000 mg | ORAL_TABLET | Freq: Every day | ORAL | 1 refills | Status: DC

## 2017-11-16 MED ORDER — ATORVASTATIN CALCIUM 20 MG PO TABS: 20.0000 mg | ORAL_TABLET | Freq: Every day | ORAL | 1 refills | Status: DC

## 2017-11-16 NOTE — Progress Notes (Signed)
Impression and Recommendations:    1. Type 2 diabetes mellitus with other specified complication, without long-term current use of insulin ([place])   2. Hypertension associated with diabetes ([place])   3. Mixed diabetic hyperlipidemia associated with type 2 diabetes mellitus ([place])   4. Diabetic peripheral neuropathy associated with type 2 diabetes mellitus ([place])     1. Diabetes Mellitus - A1c is 6.1, down from 7.2 last visit.  Reviewed ideal goal A1c of under 6.5.  - Reduce metformin to 500 mg twice daily.  Make sure not to miss any doses of medication.  Risks and benefits of medications discussed with patient, including alternative treatments.   Encouraged patient to read drug information handouts to further educate self about the medicine.  - Advised patient that his dose may be decreased if his sugars remain under control.  - Referral placed to Diabetic Nutritionist.  - Encouraged patient to take his medicine with an easily consumed meal, such as a protein shake, at a more consistent time every morning.  (Per pt, admits difficulty taking medication consistently due to lack of urge to eat).  Advised patient that his symptoms of decreased appetite may be due to his blood sugars.  Emphasized the need for the patient to eat meals prudently and consistently.  - If patient's heartburn/GI symptoms do not improve after eating more regularly, more prudently, and reducing his metformin dose, patient will be referred to Gastroenterology.  - Counseled patient on pathophysiology of disease and discussed various treatment options, which often includes dietary and lifestyle modifications as first line.  Importance of low carb/ketogenic diet discussed with patient in addition to regular exercise, and reducing stress.   - Patient with need for glucometer.  - Check FBS and 2 hours after the biggest meal of your day (postprandial).  Keep log and bring in next OV for my review.   Also, if you ever feel  poorly, please check your blood pressure and blood sugar, as one or the other could be the cause of your symptoms.  - Being a diabetic, you need yearly eye and foot exams. Make appt.for diabetic eye exam.  2. Blood Pressure - Starting low dose of BP medication today.  See med list below.  - Risks and benefits of medications discussed with patient, including alternative treatments.   Encouraged patient to read drug information handouts to further educate self about the medicine prior to starting it.   - Lifestyle changes such as dash diet and engaging in a regular exercise program discussed with patient.  Educational handouts provided  - Ambulatory BP monitoring encouraged. Keep log and bring in next OV.  Make sure to sit quietly for 15-20 minutes, without stimulation (caffiene, emotional, etc.) prior to taking blood pressure measurements.  3. Cholesterol - Patient started on statin today.  See med list below.  - Risks and benefits of medications discussed with patient, including alternative treatments.   Encouraged patient to read drug information handouts to further educate self about the medicine prior to starting it.   - Labs briefly reviewed with patient, along with 10 year ASCVD risk.  The 10-year ASCVD risk score Mikey Bussing DC Jr., et al., 2013) is: 17.3%   Values used to calculate the score:     Age: 52 years     Sex: Male     Is Non-Hispanic African American: Yes     Diabetic: Yes     Tobacco smoker: No     Systolic Blood Pressure: 401  mmHg     Is BP treated: Yes     HDL Cholesterol: 33 mg/dL     Total Cholesterol: 138 mg/dL   - Dietary changes such as low saturated & trans fat and low carb/ ketogenic diets discussed with patient.  Encouraged regular exercise and weight loss when appropriate.   - Educational handouts provided at patient's desire.  4. Mood - Strongly recommended that the patient seek the assistance of a life coach as he deals with his stressful life  events.  - Encouraged the patient to reduce stress for the sake of his general well-being.  - Patient knows that he can return to clinic at any time for mood management.  5. BMI Counseling Explained to patient what BMI refers to, and what it means medically.    Told patient to think about it as a "medical risk stratification measurement" and how increasing BMI is associated with increasing risk/ or worsening state of various diseases such as hypertension, hyperlipidemia, diabetes, premature OA, depression etc.  American Heart Association guidelines for healthy diet, basically Mediterranean diet, and exercise guidelines of 30 minutes 5 days per week or more discussed in detail.  Health counseling performed.  All questions answered.  6. Lifestyle & Preventative Health Maintenance - Advised patient to continue working toward exercising to improve overall mental, physical, and emotional health.    - Encouraged patient to engage in dedicated exercise daily, to a goal of 30 minutes 5 days per week.  Recommended that the patient eventually strive for at least 150 minutes of moderate cardiovascular activity per week according to guidelines established by the [employer].   - Healthy & prudent dietary habits encouraged, including low-carb, and high amounts of lean protein in diet.   - Patient should also consume adequate amounts of water - half of body weight in oz of water per day.   Education and routine counseling performed. Handouts provided.  7. Follow-Up - Return for re-check in 4-6 months.  - Patient will return to check CMP in six weeks since starting atorvastatin & losartan in 6 weeks; otherwise for regularly scheduled diabetes management and follow-up.  - Otherwise, patient knows to return for chronic health maintenance, and acute concerns PRN.  Pt was in the office today for 32.5+ minutes, with over 50% time spent in face to face counseling of patients various medical conditions, treatment  plans of those medical conditions including medicine management and lifestyle modification, strategies to improve health and well being; and in coordination of care. SEE ABOVE TREATMENT PLAN FOR DETAILS   Orders Placed This Encounter  Procedures  . Comprehensive metabolic panel  . Amb Referral to Nutrition and Diabetic E  . POCT glycosylated hemoglobin (Hb A1C)  . POCT UA - Microalbumin  . HM Diabetes Foot Exam    Meds ordered this encounter  Medications  . metFORMIN (GLUCOPHAGE) 500 MG tablet    Sig: Take 1 tablet (500 mg total) by mouth 2 (two) times daily with a meal.    Dispense:  180 tablet    Refill:  1  . atorvastatin (LIPITOR) 20 MG tablet    Sig: Take 1 tablet (20 mg total) by mouth at bedtime.    Dispense:  90 tablet    Refill:  1  . losartan (COZAAR) 50 MG tablet    Sig: Take 1 tablet (50 mg total) by mouth daily.    Dispense:  90 tablet    Refill:  1    Return f/up sooner if needed,  for 6 weeks lab only, 24-monthfollow-up after starting losartan, Lipitor-bring log BS and BP.   The patient was counseled, risk factors were discussed, anticipatory guidance given.  Gross side effects, risk and benefits, and alternatives of medications discussed with patient.  Patient is aware that all medications have potential side effects and we are unable to predict every side effect or drug-drug interaction that may occur.  Expresses verbal understanding and consents to current therapy plan and treatment regimen.  Please see AVS handed out to patient at the end of our visit for further patient instructions/ counseling done pertaining to today's office visit.    Note: This document was prepared using Dragon voice recognition software and may include unintentional dictation errors.  This document serves as a record of services personally performed by DMellody Dance DO. It was created on her behalf by KToni Amend a trained medical scribe. The creation of this record is based  on the scribe's personal observations and the provider's statements to them.   I have reviewed the above medical documentation for accuracy and completeness and I concur.  DMellody Dance07/28/19 9:13 PM    Subjective:    Chief Complaint  Patient presents with  . Follow-up    BKARIN GRIFFITHis a 52y.o. male [facility].    Patient is very proud of his son, who is an athlete and on the A/B honor-roll.  Mood Patient states he lost a major contract and has been under an "extreme amount" of stress.  States he's trying to figure out his next plan and sort things out now.  States he's having the most trouble trying to get his mind around the fact that he didn't do anything wrong, "played by the rules, played fair," yet his plans didn't pan out.  Acknowledges that he is his own hardest critic.   He has been consulting with his friend (who is a pTheme park manager, who was also affected by this event.  States that his friend has been a source of inspiration, since afterward his friend has leaned full time on his faith and gone fully into ministry.  DM HPI: -  He has not been working on diet and exercise for diabetes.  Patient states he was not formerly taking medication for diabetes.  Pt is currently maintained on the following medications for diabetes:   see med list today Medication compliance - Maintained on metformin, taking daily but not at a consistent time.  Home glucose readings range:  Non-compliant.  Patient has no numbers to share today, stating instead that his sugars vary depending on what he eats.  He is in need of a glucometer today.  Eating Habits & Nausea Notes that his sugars vary depending on what he eats, especially what he eats the night before.  Patient finds that sometimes his sugars drop in his sleep, which causes him to sweat and feel uncomfortable.  States he cannot eat after 8 o'clock because it  causes him to feel nauseated and throw up in the mornings.    Patient has never taken medication for GERD or heartburn.  Decreased Appetite His biggest problem currently is feeling decreased appetite.  States "I have no idea what I want to eat."  When he finally does begin to eat, he doesn't want to continue or finish eating.  Patient admits that most of the time he has trouble taking his medicine because he doesn't feel  the urge to eat.   Diabetic Neuropathy Has a history of diabetic neuropathy.  Notes he is feeling sensation, especially "pinpricks" and "stabbings" in certain parts of the feet more often now that his sugars are under better control.   Denies polyuria/polydipsia. Denies hypo/ hyperglycemia symptoms - He denies new onset of: chest pain, exercise intolerance, shortness of breath, dizziness, visual changes, headache, lower extremity swelling or claudication.   Last diabetic eye exam was No results found for: HMDIABEYEEXA  Foot exam- UTD  Last A1C in the office was:  Lab Results  Component Value Date   HGBA1C 6.1 (A) 11/16/2017   HGBA1C 7.2 (H) 07/28/2017   HGBA1C 9.6 (H) 06/22/2012    Lab Results  Component Value Date   MICROALBUR 80 11/16/2017   [place] 91 07/28/2017   CREATININE 1.00 07/28/2017   1. HTN HPI:  -  His blood pressure has not been controlled at home, running in the 140's/80's-90's.  Pt is not regularly checking it at home, but tries to check it at places like [facility] when possible.  - Patient is not currently maintained on blood pressure medications.   - Smoking Status noted.  - He denies new onset of: chest pain, exercise intolerance, shortness of breath, dizziness, visual changes, headache, lower extremity swelling or claudication.   Last 3 blood pressure readings in our office are as follows: BP Readings from Last 3 Encounters:  11/16/17 136/72  07/28/17 (!) 143/85  04/10/14 161/81    Filed Weights   11/16/17 0900  Weight: 235 lb (106.6  kg)    Last 3 blood pressure readings in our office are as follows: BP Readings from Last 3 Encounters:  11/16/17 136/72  07/28/17 (!) 143/85  04/10/14 161/81    BMI Readings from Last 3 Encounters:  11/16/17 31.87 kg/m  07/28/17 32.25 kg/m  02/17/14 32.96 kg/m     No problems updated.    Patient Care Team    Relationship Specialty Notifications Start End  Mellody Dance, DO PCP - General Family Medicine  07/28/17   Minette Brine  General Practice  07/29/17      Patient Active Problem List   Diagnosis Date Noted  . History of borderline hypertension associated with diabetes ([place]) 07/28/2017    Priority: High  . Diabetes mellitus ([place]) 07/28/2017    Priority: High  . Diabetic neuropathy, painful ([place]) 06/22/2012    Priority: High  . Morbidly obese ([place]) 07/28/2017    Priority: Medium  . Vitamin D deficiency 07/28/2017    Priority: Low  . History of noncompliance with medical treatment 07/28/2017    Priority: Low  . Diabetic peripheral neuropathy associated with type 2 diabetes mellitus ([place]) 11/16/2017  . Mixed diabetic hyperlipidemia associated with type 2 diabetes mellitus ([place]) 11/16/2017  . Essential hypertension, benign 06/22/2012  . Pancreatitis, acute 06/22/2012  . Nausea with vomiting 06/22/2012  . Abdominal pain, epigastric 06/22/2012  . Acute hyperkalemia 06/22/2012  . Leukocytosis, unspecified 06/22/2012     Past Medical History:  Diagnosis Date  . Diabetes mellitus without complication ([place])   . Hypertension      Past Surgical History:  Procedure Laterality Date  . APPENDECTOMY    . BACK SURGERY       Family History  Problem Relation Age of Onset  . Cancer Mother   . Diabetes Mother   . Diabetes Father      Social History   Substance and Sexual Activity  Drug Use No  ,  Social History  Substance and Sexual Activity  Alcohol Use Yes   Comment: 3 drinks a month  ,  Social History   Tobacco Use  Smoking Status Former  Smoker  . Last attempt to quit: 06/23/1995  . Years since quitting: 22.4  Smokeless Tobacco Current User  . Types: Chew  ,    Current Outpatient Medications on File Prior to Visit  Medication Sig Dispense Refill  . blood glucose meter kit and supplies Dispense based on patient and insurance preference. Use up to four times daily as directed. (FOR ICD-10 E10.9, E11.9). 1 each 0  . ergocalciferol (VITAMIN D2) 50000 units capsule Take 1 capsule (50,000 Units total) by mouth 2 (two) times a week. Wednesday and Sunday 16 capsule 1  . gabapentin (NEURONTIN) 600 MG tablet Take 1 tablet (600 mg total) by mouth 3 (three) times daily. 270 tablet 1   No current facility-administered medications on file prior to visit.      No Known Allergies   Review of Systems:   General:  Denies fever, chills Optho/Auditory:   Denies visual changes, blurred vision Respiratory:   Denies SOB, cough, wheeze, DIB  Cardiovascular:   Denies chest pain, palpitations, painful respirations Gastrointestinal:   Denies nausea, vomiting, diarrhea.  Endocrine:     Denies new hot or cold intolerance Musculoskeletal:  Denies joint swelling, gait issues, or new unexplained myalgias/ arthralgias Skin:  Denies rash, suspicious lesions  Neurological:    Denies dizziness, unexplained weakness, numbness  Psychiatric/Behavioral:   Denies mood changes    Objective:     Blood pressure 136/72, pulse 73, height 6' (1.829 m), weight 235 lb (106.6 kg), SpO2 100 %.  Body mass index is 31.87 kg/m.  General: Well Developed, well nourished, and in no acute distress.  HEENT: Normocephalic, atraumatic, pupils equal round reactive to light, neck supple, No carotid bruits, no JVD Skin: Warm and dry, cap RF less 2 sec Cardiac: Regular rate and rhythm, S1, S2 WNL's, no murmurs rubs or gallops Respiratory: ECTA B/L, Not using accessory muscles, speaking in full sentences. NeuroM-Sk: Ambulates w/o assistance, moves ext * 4 w/o  difficulty, sensation grossly intact.  Ext: scant edema b/l lower ext Psych: No HI/SI, judgement and insight good, Euthymic mood. Full Affect. Abdominal: Patient with hernia along linea alba.

## 2017-11-16 NOTE — Patient Instructions (Signed)

## 2017-12-28 ENCOUNTER — Ambulatory Visit: Payer: BC Managed Care – PPO | Admitting: Family Medicine

## 2018-05-17 ENCOUNTER — Ambulatory Visit (INDEPENDENT_AMBULATORY_CARE_PROVIDER_SITE_OTHER): Payer: BC Managed Care – PPO | Admitting: Family Medicine

## 2018-05-17 ENCOUNTER — Encounter: Payer: Self-pay | Admitting: Family Medicine

## 2018-05-17 VITALS — BP 152/90 | HR 80 | Temp 98.6°F | Ht 72.0 in | Wt 239.6 lb

## 2018-05-17 DIAGNOSIS — E1169 Type 2 diabetes mellitus with other specified complication: Secondary | ICD-10-CM

## 2018-05-17 DIAGNOSIS — H9201 Otalgia, right ear: Secondary | ICD-10-CM

## 2018-05-17 DIAGNOSIS — J019 Acute sinusitis, unspecified: Secondary | ICD-10-CM

## 2018-05-17 DIAGNOSIS — G510 Bell's palsy: Secondary | ICD-10-CM

## 2018-05-17 DIAGNOSIS — E1159 Type 2 diabetes mellitus with other circulatory complications: Secondary | ICD-10-CM

## 2018-05-17 DIAGNOSIS — I152 Hypertension secondary to endocrine disorders: Secondary | ICD-10-CM

## 2018-05-17 DIAGNOSIS — I10 Essential (primary) hypertension: Secondary | ICD-10-CM

## 2018-05-17 MED ORDER — METHYLPREDNISOLONE ACETATE 40 MG/ML IJ SUSP
40.0000 mg | Freq: Once | INTRAMUSCULAR | Status: AC
  Administered 2018-05-17: 40 mg via INTRAMUSCULAR

## 2018-05-17 MED ORDER — GABAPENTIN 600 MG PO TABS: 600.0000 mg | ORAL_TABLET | Freq: Three times a day (TID) | ORAL | 0 refills | Status: DC

## 2018-05-17 MED ORDER — AMOXICILLIN 500 MG PO CAPS: 1000.0000 mg | ORAL_CAPSULE | Freq: Three times a day (TID) | ORAL | 0 refills | Status: DC

## 2018-05-17 MED ORDER — METFORMIN HCL 500 MG PO TABS: 500.0000 mg | ORAL_TABLET | Freq: Two times a day (BID) | ORAL | 0 refills | Status: DC

## 2018-05-17 MED ORDER — PREDNISONE 20 MG PO TABS: ORAL_TABLET | ORAL | 0 refills | Status: DC

## 2018-05-17 MED ORDER — ERGOCALCIFEROL 1.25 MG (50000 UT) PO CAPS: 50000.0000 [IU] | ORAL_CAPSULE | ORAL | 0 refills | Status: DC

## 2018-05-17 MED ORDER — DEXAMETHASONE SODIUM PHOSPHATE 4 MG/ML IJ SOLN
4.0000 mg | Freq: Once | INTRAMUSCULAR | Status: AC
  Administered 2018-05-17: 4 mg via INTRAMUSCULAR

## 2018-05-17 MED ORDER — BLOOD GLUCOSE METER KIT: PACK | 0 refills | Status: DC

## 2018-05-17 MED ORDER — ATORVASTATIN CALCIUM 20 MG PO TABS: 20.0000 mg | ORAL_TABLET | Freq: Every day | ORAL | 0 refills | Status: DC

## 2018-05-17 MED ORDER — LOSARTAN POTASSIUM 50 MG PO TABS: 50.0000 mg | ORAL_TABLET | Freq: Every day | ORAL | 0 refills | Status: DC

## 2018-05-17 NOTE — Progress Notes (Signed)
Acute Care Office visit  Assessment and plan:  1. Right-sided Bell's palsy   2. Acute sinusitis, recurrence not specified, unspecified location   3. Otalgia of right ear   4. Type 2 diabetes mellitus with other specified complication, without long-term current use of insulin ([place])   5. Hypertension associated with diabetes ([place])     1. Right-Sided Bell's Palsy; Acute Sinusitis, Otalgia of Right Ear - Viral vs Allergic vs Bacterial causes for pt's symptoms reveiwed.     - Steroids prescribed today.  See med list today. - Injection given in office, and prednisone tablets to be started tomorrow. - Patient knows to monitor his blood sugars and keep an eye on them while on steroids.  - If in 2-3 days, symptoms begin to clear up on steroids, patient knows to not take antibiotics. - Since symptoms ongoing for more than two weeks, antibiotics prescribed today.  - Only if symptoms continue after treatment on steroids, patient will begin antibiotics.  - Supportive care and various OTC medications discussed in addition to any prescribed.  - Call or RTC if new symptoms, or if no improvement or worse over next several days.    - Patient knows that if he has symptoms for over 10 days, or 7 days and getting worse on the 8th day, he should ALWAYS come in for acute assessment and evaluation.  2. Chronic Health Management - DM, HTN - Need for follow-up.  Has been more than six months since last A1c check. - Patient needs refills on all his medicines.  He has not followed up as supposed to.  - Patient was given 30 day refill of medications needed today.  Knows to return for chronic care and assessment before further refills will be provided.  - Blood pressure poorly controlled.  Patient knows to bring in a log of blood pressures and blood sugars next office visit for evaluation.  - Patient knows to keep a log of fasting blood sugars to bring in for assessment next visit.  - Otherwise,  patient knows to return regularly for chronic management of diabetes, blood pressure, etc. - Reviewed general importance of losing weight to help control his blood pressure, A1c, cholesterol, etc.     Meds ordered this encounter  Medications  . predniSONE (DELTASONE) 20 MG tablet    Sig: Take 3 pills a day for 2 days, 2 pills a day for 2 days, 1 pill a day for 2 days then one half pill a day for 2 days then off    Dispense:  14 tablet    Refill:  0  . amoxicillin (AMOXIL) 500 MG capsule    Sig: Take 2 capsules (1,000 mg total) by mouth 3 (three) times daily.    Dispense:  60 capsule    Refill:  0  . atorvastatin (LIPITOR) 20 MG tablet    Sig: Take 1 tablet (20 mg total) by mouth at bedtime.    Dispense:  30 tablet    Refill:  0  . ergocalciferol (VITAMIN D2) 1.25 MG (50000 UT) capsule    Sig: Take 1 capsule (50,000 Units total) by mouth 2 (two) times a week. Wednesday and Sunday    Dispense:  10 capsule    Refill:  0  . gabapentin (NEURONTIN) 600 MG tablet    Sig: Take 1 tablet (600 mg total) by mouth 3 (three) times daily.    Dispense:  90 tablet    Refill:  0  .  losartan (COZAAR) 50 MG tablet    Sig: Take 1 tablet (50 mg total) by mouth daily.    Dispense:  30 tablet    Refill:  0  . metFORMIN (GLUCOPHAGE) 500 MG tablet    Sig: Take 1 tablet (500 mg total) by mouth 2 (two) times daily with a meal.    Dispense:  60 tablet    Refill:  0  . blood glucose meter kit and supplies    Sig: Dispense based on patient and insurance preference. Use to test glucose fasting in the morning and then 2 hours after largest meal. (FOR ICD-10 E10.9, E11.9).    Dispense:  1 each    Refill:  0    Order Specific Question:   Number of strips    Answer:   200    Order Specific Question:   Number of lancets    Answer:   200  . dexamethasone (DECADRON) injection 4 mg  . methylPREDNISolone acetate (DEPO-MEDROL) injection 40 mg    Medications Discontinued During This Encounter  Medication Reason    . atorvastatin (LIPITOR) 20 MG tablet Reorder  . ergocalciferol (VITAMIN D2) 50000 units capsule Reorder  . gabapentin (NEURONTIN) 600 MG tablet Reorder  . losartan (COZAAR) 50 MG tablet Reorder  . metFORMIN (GLUCOPHAGE) 500 MG tablet Reorder  . blood glucose meter kit and supplies     Gross side effects, risk and benefits, and alternatives of medications discussed with patient.  Patient is aware that all medications have potential side effects and we are unable to predict every sideeffect or drug-drug interaction that may occur.  Expresses verbal understanding and consents to current therapy plan and treatment regiment.   Education and routine counseling performed. Handouts provided.  Anticipatory guidance and routine counseling done re: condition, txmnt options and need for follow up. All questions of patient's were answered.  No follow-ups on file.  Please see AVS handed out to patient at the end of our visit for additional patient instructions/ counseling done pertaining to today's office visit.  Note:  This document was partially repared using Dragon voice recognition software and may include unintentional dictation errors.  This document serves as a record of services personally performed by Mellody Dance, DO. It was created on her behalf by [name], a trained medical scribe. The creation of this record is based on the scribe's personal observations and the provider's statements to them.   I have reviewed the above medical documentation for accuracy and completeness and I concur.  Mellody Dance, DO 05/17/2018 1:09 PM       Subjective:    Chief Complaint  Patient presents with  . Headache    HPI:  Pt presents with Sx for two weeks.  States that his headache started two weeks ago.   C/o:  Experiencing headache, sinus pain, "pain, pain, nothing but pain."  This pain wraps from top of his head to the back of the right side of the head, then back posterior  to the right ear, and states that the right side of his face has gone numb.  States "I have a bad tooth, too; could that be a part of it?"  Confirms that he's had post nasal drip.  "My throat started getting sore this morning."  Denies:  Cough, SOB.    For symptoms patient has tried:  He has tried sinus medicine, tylenol "arthrits pills."  Overall getting:  Was worried about the facial numbness on the right side of his face.  States "it's been very difficult for me to eat."  Says he's been experiencing loss and lack of appetite lately.   Patient Care Team    Relationship Specialty Notifications Start End  Mellody Dance, DO PCP - General Family Medicine  07/28/17   Minette Brine, [place]  [facility]  07/29/17     Past medical history, Surgical history, Family history reviewed and noted below, Social history, Allergies, and Medications have been entered into the medical record, reviewed and changed as needed.   No Known Allergies  Review of Systems: - see above HPI for pertinent positives General:   No F/C, wt loss Pulm:   No DIB, pleuritic chest pain Card:  No CP, palpitations Abd:  No n/v/d or pain Ext:  No inc edema from baseline   Objective:   Blood pressure (!) 152/90, pulse 80, temperature 98.6 F (37 C), height 6' (1.829 m), weight 239 lb 9.6 oz (108.7 kg), SpO2 100 %. Body mass index is 32.5 kg/m. General: Well Developed, well nourished, appropriate for stated age.  Neuro: Alert and oriented x3, extra-ocular muscles intact, sensation grossly intact.  HEENT: Normocephalic, atraumatic, pupils equal round reactive to light, neck supple, no masses, no painful lymphadenopathy, TM's intact B/L, no acute findings. Nares- patent, clear d/c, OP- clear, mild erythema, No TTP sinuses Skin: Warm and dry, no gross rash. Cardiac: RRR, S1 S2,  no murmurs rubs or gallops.  Respiratory: ECTA B/L and A/P, Not using accessory muscles, speaking in full sentences-  unlabored. Vascular:  No gross lower ext edema, cap RF less 2 sec. Psych: No HI/SI, judgement and insight good, Euthymic mood. Full Affect.

## 2018-05-17 NOTE — Patient Instructions (Addendum)
Remember your blood sugars may be elevated with the use of steroids.  Please be monitoring your fasting blood sugar as well as 2 hours after largest meal of the day.  Write these down and bring in within the next 4 weeks or less.  Also be checking your blood pressures and write those down as well.  Please check at least every other day and write down the blood pressure as well as pulse.  I need to see you in less than 4 weeks for chronic follow-up.  It is been more than 6 months since your last check and this needs to be checked every 3 months.     Bell Palsy, Adult  Bell palsy is a short-term inability to move muscles in part of the face. The inability to move (paralysis) results from inflammation or compression of the facial nerve, which travels along the skull and under the ear to the side of the face (7th cranial nerve). This nerve is responsible for facial movements that include blinking, closing the eyes, smiling, and frowning. What are the causes? The exact cause of this condition is not known. It may be caused by an infection from a virus, such as the chickenpox (herpes zoster), Epstein-Barr, or mumps virus. What increases the risk? You are more likely to develop this condition if:  You are pregnant.  You have diabetes.  You have had a recent infection in your nose, throat, or airways (upper respiratory infection).  You have a weakened body defense system (immune system).  You have had a facial injury, such as a fracture.  You have a family history of Bell palsy. What are the signs or symptoms? Symptoms of this condition include:  Weakness on one side of the face.  Drooping eyelid and corner of the mouth.  Excessive tearing in one eye.  Difficulty closing the eyelid.  Dry eye.  Drooling.  Dry mouth.  Changes in taste.  Change in facial appearance.  Pain behind one ear.  Ringing in one or both ears.  Sensitivity to sound in one ear.  Facial  twitching.  Headache.  Impaired speech.  Dizziness.  Difficulty eating or drinking. Most of the time, only one side of the face is affected. Rarely, Bell palsy affects the whole face. How is this diagnosed? This condition is diagnosed based on:  Your symptoms.  Your medical history.  A physical exam. You may also have to see health care providers who specialize in disorders of the nerves (neurologist) or diseases and conditions of the eye (ophthalmologist). You may have tests, such as:  A test to check for nerve damage (electromyogram).  Imaging studies, such as CT or MRI scans.  Blood tests. How is this treated? This condition affects every person differently. Sometimes symptoms go away without treatment within a couple weeks. If treatment is needed, it varies from person to person. The goal of treatment is to reduce inflammation and protect the eye from damage. Treatment for Bell palsy may include:  Medicines, such as: ? Steroids to reduce swelling and inflammation. ? Antiviral drugs. ? Pain relievers, including aspirin, acetaminophen, or ibuprofen.  Eye drops or ointment to keep your eye moist.  Eye protection, if you cannot close your eye.  Exercises or massage to regain muscle strength and function (physical therapy). Follow these instructions at home:   Take over-the-counter and prescription medicines only as told by your health care provider.  If your eye is affected: ? Keep your eye moist with eye drops  or ointment as told by your health care provider. ? Follow instructions for eye care and protection as told by your health care provider.  Do any physical therapy exercises as told by your health care provider.  Keep all follow-up visits as told by your health care provider. This is important. Contact a health care provider if:  You have a fever.  Your symptoms do not get better within 2-3 weeks, or your symptoms get worse.  Your eye is red, irritated,  or painful.  You have new symptoms. Get help right away if:  You have weakness or numbness in a part of your body other than your face.  You have trouble swallowing.  You develop neck pain or stiffness.  You develop dizziness or shortness of breath. Summary  Bell palsy is a short-term inability to move muscles in part of the face. The inability to move (paralysis) results from inflammation or compression of the facial nerve.  This condition affects every person differently. Sometimes symptoms go away without treatment within a couple weeks.  If treatment is needed, it varies from person to person. The goal of treatment is to reduce inflammation and protect the eye from damage.  Contact your health care provider if your symptoms do not get better within 2-3 weeks, or your symptoms get worse. This information is not intended to replace advice given to you by your health care provider. Make sure you discuss any questions you have with your health care provider. Document Released: 04/19/2005 Document Revised: 03/18/2017 Document Reviewed: 06/22/2016 Elsevier Interactive Patient Education  2019 [employer].

## 2018-05-18 ENCOUNTER — Other Ambulatory Visit: Payer: Self-pay

## 2018-05-18 ENCOUNTER — Other Ambulatory Visit: Payer: BC Managed Care – PPO

## 2018-05-18 DIAGNOSIS — I10 Essential (primary) hypertension: Secondary | ICD-10-CM

## 2018-05-18 DIAGNOSIS — E782 Mixed hyperlipidemia: Secondary | ICD-10-CM

## 2018-05-18 DIAGNOSIS — E1169 Type 2 diabetes mellitus with other specified complication: Secondary | ICD-10-CM

## 2018-05-18 DIAGNOSIS — E559 Vitamin D deficiency, unspecified: Secondary | ICD-10-CM

## 2018-05-18 DIAGNOSIS — Z0000 Encounter for general adult medical examination without abnormal findings: Secondary | ICD-10-CM

## 2018-05-19 LAB — COMPREHENSIVE METABOLIC PANEL
ALT: 21 IU/L (ref 0–44)
AST: 20 IU/L (ref 0–40)
Albumin/Globulin Ratio: 1.1 — ABNORMAL LOW (ref 1.2–2.2)
Albumin: 3.8 g/dL (ref 3.5–5.5)
Alkaline Phosphatase: 128 IU/L — ABNORMAL HIGH (ref 39–117)
BUN/Creatinine Ratio: 9 (ref 9–20)
BUN: 8 mg/dL (ref 6–24)
Bilirubin Total: 0.6 mg/dL (ref 0.0–1.2)
CO2: 25 mmol/L (ref 20–29)
Calcium: 9.8 mg/dL (ref 8.7–10.2)
Chloride: 96 mmol/L (ref 96–106)
Creatinine, Ser: 0.9 mg/dL (ref 0.76–1.27)
GFR calc Af Amer: 115 mL/min/{1.73_m2} (ref >59)
GFR calc non Af Amer: 99 mL/min/{1.73_m2} (ref >59)
Globulin, Total: 3.6 g/dL (ref 1.5–4.5)
Glucose: 262 mg/dL — ABNORMAL HIGH (ref 65–99)
Potassium: 4.8 mmol/L (ref 3.5–5.2)
Sodium: 133 mmol/L — ABNORMAL LOW (ref 134–144)
Total Protein: 7.4 g/dL (ref 6.0–8.5)

## 2018-05-19 LAB — T4, FREE: Free T4: 0.88 ng/dL (ref 0.82–1.77)

## 2018-05-19 LAB — HEMOGLOBIN A1C
Est. average glucose Bld gHb Est-mCnc: 146 mg/dL
Hgb A1c MFr Bld: 6.7 % — ABNORMAL HIGH (ref 4.8–5.6)

## 2018-05-19 LAB — CBC WITH DIFFERENTIAL/PLATELET
Basophils Absolute: 0 10*3/uL (ref 0.0–0.2)
Basos: 0 %
EOS (ABSOLUTE): 0 10*3/uL (ref 0.0–0.4)
Eos: 0 %
Hematocrit: 38.2 % (ref 37.5–51.0)
Hemoglobin: 13.6 g/dL (ref 13.0–17.7)
Immature Grans (Abs): 0 10*3/uL (ref 0.0–0.1)
Immature Granulocytes: 0 %
Lymphocytes Absolute: 1.1 10*3/uL (ref 0.7–3.1)
Lymphs: 10 %
MCH: 32.1 pg (ref 26.6–33.0)
MCHC: 35.6 g/dL (ref 31.5–35.7)
MCV: 90 fL (ref 79–97)
Monocytes Absolute: 0.6 10*3/uL (ref 0.1–0.9)
Monocytes: 6 %
Neutrophils Absolute: 9.1 10*3/uL — ABNORMAL HIGH (ref 1.4–7.0)
Neutrophils: 84 %
Platelets: 106 10*3/uL — ABNORMAL LOW (ref 150–450)
RBC: 4.24 x10E6/uL (ref 4.14–5.80)
RDW: 12 % (ref 11.6–15.4)
WBC: 10.9 10*3/uL — ABNORMAL HIGH (ref 3.4–10.8)

## 2018-05-19 LAB — T3: T3, Total: 114 ng/dL (ref 71–180)

## 2018-05-19 LAB — TSH: TSH: 0.295 u[IU]/mL — ABNORMAL LOW (ref 0.450–4.500)

## 2018-05-19 LAB — LIPID PANEL
Chol/HDL Ratio: 4.8 ratio (ref 0.0–5.0)
Cholesterol, Total: 158 mg/dL (ref 100–199)
HDL: 33 mg/dL — ABNORMAL LOW (ref >39)
LDL Calculated: 94 mg/dL (ref 0–99)
Triglycerides: 156 mg/dL — ABNORMAL HIGH (ref 0–149)
VLDL Cholesterol Cal: 31 mg/dL (ref 5–40)

## 2018-05-19 LAB — VITAMIN D 25 HYDROXY (VIT D DEFICIENCY, FRACTURES): Vit D, 25-Hydroxy: 24.2 ng/mL — ABNORMAL LOW (ref 30.0–100.0)

## 2018-05-24 ENCOUNTER — Ambulatory Visit (INDEPENDENT_AMBULATORY_CARE_PROVIDER_SITE_OTHER): Payer: BC Managed Care – PPO | Admitting: Family Medicine

## 2018-05-24 VITALS — BP 162/80 | HR 82 | Temp 98.7°F | Ht 72.0 in | Wt 234.8 lb

## 2018-05-24 DIAGNOSIS — I10 Essential (primary) hypertension: Secondary | ICD-10-CM

## 2018-05-24 DIAGNOSIS — E782 Mixed hyperlipidemia: Secondary | ICD-10-CM

## 2018-05-24 DIAGNOSIS — E1169 Type 2 diabetes mellitus with other specified complication: Secondary | ICD-10-CM

## 2018-05-24 DIAGNOSIS — E1140 Type 2 diabetes mellitus with diabetic neuropathy, unspecified: Secondary | ICD-10-CM | POA: Diagnosis not present

## 2018-05-24 DIAGNOSIS — R7989 Other specified abnormal findings of blood chemistry: Secondary | ICD-10-CM

## 2018-05-24 DIAGNOSIS — I152 Hypertension secondary to endocrine disorders: Secondary | ICD-10-CM

## 2018-05-24 DIAGNOSIS — R6889 Other general symptoms and signs: Secondary | ICD-10-CM

## 2018-05-24 DIAGNOSIS — E1159 Type 2 diabetes mellitus with other circulatory complications: Secondary | ICD-10-CM | POA: Diagnosis not present

## 2018-05-24 DIAGNOSIS — E559 Vitamin D deficiency, unspecified: Secondary | ICD-10-CM

## 2018-05-24 MED ORDER — LOSARTAN POTASSIUM 50 MG PO TABS: 50.0000 mg | ORAL_TABLET | Freq: Every day | ORAL | 0 refills | Status: DC

## 2018-05-24 MED ORDER — ATORVASTATIN CALCIUM 20 MG PO TABS: 20.0000 mg | ORAL_TABLET | Freq: Every day | ORAL | 0 refills | Status: DC

## 2018-05-24 MED ORDER — GABAPENTIN 600 MG PO TABS: 600.0000 mg | ORAL_TABLET | Freq: Three times a day (TID) | ORAL | 0 refills | Status: DC

## 2018-05-24 MED ORDER — ERGOCALCIFEROL 1.25 MG (50000 UT) PO CAPS: 50000.0000 [IU] | ORAL_CAPSULE | ORAL | 0 refills | Status: DC

## 2018-05-24 MED ORDER — METFORMIN HCL 500 MG PO TABS: 500.0000 mg | ORAL_TABLET | Freq: Two times a day (BID) | ORAL | 0 refills | Status: DC

## 2018-05-24 NOTE — Progress Notes (Signed)
Impression and Recommendations:    1. Type 2 diabetes mellitus with other specified complication, without long-term current use of insulin ([place])   2. Hypertension associated with diabetes ([place])   3. Diabetic neuropathy, painful ([place])   4. Mixed diabetic hyperlipidemia associated with type 2 diabetes mellitus ([place])   5. Morbidly obese ([place])   6. Vitamin D deficiency   7. Low TSH level   8. Intolerance to cold     - Reviewed that patient must review his med list every visit, so that we can know exactly how he is taking medications, in order to make appropriate changes.  - Patient would like to return in March for chronic follow-up.  1. Recent Bell's Palsy & Viral Sx - Patient is currently finishing steroids as prescribed last acute visit. - Patient was advised to finish his amoxicillin now that he's started.  - Advised patient that his recovery process will take him some time.  - Advised patient to use eye drops to alleviate dryness in affected eye.  2. Reviewed recent lab work (05/18/2018) in depth with patient today.  All lab work within normal limits unless otherwise noted.  Extensive education provided.  All questions were answered.  - Discussed organ health with patient today.  Reviewed importance of controlled blood sugar, controlled blood sugar, adequate hydration, and regular exercise to help improve organ health and prevent deterioration.  3. Hyperthyroidism - TSH 0.295, below normal limits. - TSH down to 0.295, down from 0.342 last check.  - Low TSH, normal T3 and T4. - Referral placed to endocrinology today.   4. Diabetes Mellitus - A1c up to 6.7 from 6.1 last check. - Reviewed goal A1c range at length with patient today.  - Blood sugar controlled at this time. - Continue treatment plan as prescribed.  See med list below. - Patient tolerating meds well without complication.  Denies S-E.  - Counseled patient on pathophysiology of disease and discussed  various treatment options, which often includes dietary and lifestyle modifications as first line.  Importance of low carb/ketogenic diet discussed with patient in addition to regular exercise.   - Advised patient to avoid sugary drinks, to eat his calories instead of drinking them.  - Check FBS and 2 hours after the biggest meal of your day.  Encouraged patient to check his blood sugar when he eats especially well or especially poorly.   - Keep log and bring in next OV for my review.   Also, if you ever feel poorly, please check your blood pressure and blood sugar, as one or the other could be the cause of your symptoms.  - Being a diabetic, you need yearly eye and foot exams. Make appt.for diabetic eye exam.  5. Diabetic Neuropathy, Painful - Discussed that the higher his A1c, the more his neuropathy will tend to manifest.  - Will continue to monitor.  6. Hypertension Associated with Diabetes - Blood pressure elevated on intake today. -  Recommended change in treatment plan today- pt declined.  See med list below. - Patient tolerating meds well without complication.  Denies S-E  - Extensively discussed goal blood pressure with patient today, consistently under 135/85.  - Lifestyle changes such as dash diet and engaging in a regular exercise program discussed with patient.  Educational handouts provided  - Ambulatory BP monitoring STRONGLY encouraged. Keep log and bring in next OV.   7. Mixed Diabetic Hyperlipidemia - Managed on Lipitor Triglycerides = 156, elevated. LDL =  94, WNL. HDL = 33, low.  - LDL controlled at this time. - Continue treatment plan as prescribed.  See med list below. - Patient tolerating meds well without complication.  Denies S-E  - Dietary changes such as low saturated & trans fat and low carb/ ketogenic diets extensively discussed with patient.  Encouraged regular exercise and weight loss when appropriate.   - Reviewed heart-healthy diet and prudent  lifestyle modifications to help reduce triglycerides, and improve HDL.  - Educational handouts provided at patient's desire.  8. Vitamin D Deficiency - Patient confirms taking his Vitamin D as recently prescribed. - discussed goal in 50-60 range.  - Continue taking supplementation as recommended.  See med list below. - Patient tolerating meds well without complication.  Denies S-E.  - Will continue to monitor. - Re-check in 4 months.  9. Constipation Concerns - Advised patient to adequately hydrate, eat more healthfully, and see if his stool habits regulate.  - Will continue to monitor.  10. BMI Counseling - Body mass index is 31.84 kg/m. Explained to patient what BMI refers to, and what it means medically.    Told patient to think about it as a "medical risk stratification measurement" and how increasing BMI is associated with increasing risk/ or worsening state of various diseases such as hypertension, hyperlipidemia, diabetes, premature OA, depression etc.  American Heart Association guidelines for healthy diet, basically Mediterranean diet, and exercise guidelines of 30 minutes 5 days per week or more discussed in detail.  Health counseling performed.  All questions answered.  11. Lifestyle & Preventative Health Maintenance - Advised patient to continue working toward exercising to improve overall mental, physical, and emotional health.    - Reviewed the "spokes of the wheel" of mood and health management.  Stressed the importance of ongoing prudent habits, including regular exercise, appropriate sleep hygiene, healthful dietary habits, and prayer/meditation to relax.  - Encouraged patient to engage in daily physical activity, especially a formal exercise routine.  Recommended that the patient eventually strive for at least 150 minutes of moderate cardiovascular activity per week according to guidelines established by the [facility].   - Healthy dietary habits encouraged, including  low-carb, and high amounts of lean protein in diet.   - Patient should also consume adequate amounts of water.   Education and routine counseling performed. Handouts provided.  Pt was interviewed and evaluated by me in the clinic today for 32.5+ minutes, with over 50% time spent in face to face counseling of patients various medical conditions, treatment plans of those medical conditions including medicine management and lifestyle modification, strategies to improve health and well being; and in coordination of care. SEE ABOVE TREATMENT PLAN FOR DETAILS   Orders Placed This Encounter  Procedures  . Ambulatory referral to Endocrinology    Meds ordered this encounter  Medications  . metFORMIN (GLUCOPHAGE) 500 MG tablet    Sig: Take 1 tablet (500 mg total) by mouth 2 (two) times daily with a meal.    Dispense:  360 tablet    Refill:  0  . atorvastatin (LIPITOR) 20 MG tablet    Sig: Take 1 tablet (20 mg total) by mouth at bedtime.    Dispense:  90 tablet    Refill:  0  . ergocalciferol (VITAMIN D2) 1.25 MG (50000 UT) capsule    Sig: Take 1 capsule (50,000 Units total) by mouth 2 (two) times a week. Wednesday and Sunday    Dispense:  24 capsule    Refill:  0  . gabapentin (NEURONTIN) 600 MG tablet    Sig: Take 1 tablet (600 mg total) by mouth 3 (three) times daily.    Dispense:  90 tablet    Refill:  0  . losartan (COZAAR) 50 MG tablet    Sig: Take 1 tablet (50 mg total) by mouth daily.    Dispense:  90 tablet    Refill:  0    Medications Discontinued During This Encounter  Medication Reason  . metFORMIN (GLUCOPHAGE) 500 MG tablet Reorder  . atorvastatin (LIPITOR) 20 MG tablet Reorder  . ergocalciferol (VITAMIN D2) 1.25 MG (50000 UT) capsule Reorder  . gabapentin (NEURONTIN) 600 MG tablet Reorder  . losartan (COZAAR) 50 MG tablet Reorder     Orders Placed This Encounter  Procedures  . Ambulatory referral to Endocrinology    The patient was counseled, risk factors were  discussed, anticipatory guidance given.  Gross side effects, risk and benefits, and alternatives of medications discussed with patient.  Patient is aware that all medications have potential side effects and we are unable to predict every side effect or drug-drug interaction that may occur.  Expresses verbal understanding and consents to current therapy plan and treatment regimen.  Return for in March per your request for your DM, BP etc-  bring BP and BS log.  Please see AVS handed out to patient at the end of our visit for further patient instructions/ counseling done pertaining to today's office visit.    Note:  This document was prepared using Dragon voice recognition software and may include unintentional dictation errors.  This document serves as a record of services personally performed by Mellody Dance, DO. It was created on her behalf by [name], a trained medical scribe. The creation of this record is based on the scribe's personal observations and the provider's statements to them.   I have reviewed the above medical documentation for accuracy and completeness and I concur.  Mellody Dance, DO 05/28/2018 7:44 PM       Subjective:    HPI: [name] is a 53 y.o. male who presents to [place] [facility] today for follow up of [place].    Recent Bell's Palsy Patient recently had Bell's Palsy and continues to experience facial droop.  States that his eye is a little dry.  He was unclear on how to take his prior medications, continues on steroids, and recently started Amoxicillin.  He understands to finish his antibiotics as prescribed.  Hydration States that he picked up his water intake, and tries to drink 5-6 20 oz bottles per day.  Says that he drinks a lot of iced tea, not always diet.  Vitamin D Confirms that he just began taking these as prescribed.  Thyroid Feels he is cold all the time.  Constipation / Bowel  Concerns Discussed that patient may be experiencing constipation due to less-than-ideally controlled diabetes mellitus.  Patient was educated in office today that diabetes can interfere with gut peristalsis.  Prudent [employer] Habits Moving Forward Notes that the family has memberships to the [employer], and they plan to begin a healthful diet together.  HTN:  -  His blood pressure may or may not be controlled at home.  Pt has not been checking it regularly.  Reports that he and Fraser Din have been taking his blood pressure using the a wrist measurement, but states "it's running ten points high," and is unsure what number this correlates to.  On further  discussion, reports a range of 139 to 169, generally bouncing between 140-170.  Feels the bottom number is also typically elevated.  - Patient reports good compliance with blood pressure medications  - Denies medication S-E   - Smoking Status noted   - He denies new onset of: chest pain, exercise intolerance, shortness of breath, dizziness, visual changes, headache, lower extremity swelling or claudication.   Last 3 blood pressure readings in our office are as follows: BP Readings from Last 3 Encounters:  05/24/18 (!) 162/80  05/17/18 (!) 152/90  11/16/17 136/72    Pulse Readings from Last 3 Encounters:  05/24/18 82  05/17/18 80  11/16/17 73    Filed Weights   05/24/18 1426  Weight: 234 lb 12.8 oz (106.5 kg)    DM HPI:  -  He has not been working on diet and exercise for diabetes  He has not been taking a log of his blood sugars.  Patient has not obtained a glucometer because he was going to be charged $90.  Pt is currently maintained on the following medications for diabetes: See med list today  Medication compliance - taking medications as prescribed.  Home glucose readings range: has not been checking his blood sugars.   Denies polyuria/polydipsia.  Denies hypo/ hyperglycemia symptoms  Last diabetic eye exam was No results found  for: [id number]  Foot exam- UTD  Last A1C in the office was:  Lab Results  Component Value Date   HGBA1C 6.7 (H) 05/18/2018   HGBA1C 6.1 (A) 11/16/2017   HGBA1C 7.2 (H) 07/28/2017    Lab Results  Component Value Date   MICROALBUR 80 11/16/2017   LDLCALC 94 05/18/2018   CREATININE 0.90 05/18/2018    Wt Readings from Last 3 Encounters:  05/24/18 234 lb 12.8 oz (106.5 kg)  05/17/18 239 lb 9.6 oz (108.7 kg)  11/16/17 235 lb (106.6 kg)    BP Readings from Last 3 Encounters:  05/24/18 (!) 162/80  05/17/18 (!) 152/90  11/16/17 136/72     Patient Care Team    Relationship Specialty Notifications Start End  Mellody Dance, DO PCP - General Family Medicine  07/28/17   Minette Brine, [place]  General Practice  07/29/17      Lab Results  Component Value Date   CREATININE 0.90 05/18/2018   BUN 8 05/18/2018   NA 133 (L) 05/18/2018   K 4.8 05/18/2018   CL 96 05/18/2018   CO2 25 05/18/2018    Lab Results  Component Value Date   CHOL 158 05/18/2018   CHOL 138 07/28/2017    Lab Results  Component Value Date   HDL 33 (L) 05/18/2018   HDL 33 (L) 07/28/2017    Lab Results  Component Value Date   LDLCALC 94 05/18/2018   LDLCALC 91 07/28/2017    Lab Results  Component Value Date   TRIG 156 (H) 05/18/2018   TRIG 72 07/28/2017    Lab Results  Component Value Date   CHOLHDL 4.8 05/18/2018   CHOLHDL 4.2 07/28/2017    Lab Results  Component Value Date   LDLDIRECT 99 07/28/2017   ===================================================================   Patient Active Problem List   Diagnosis Date Noted  . Mixed diabetic hyperlipidemia associated with type 2 diabetes mellitus ([place]) 11/16/2017    Priority: High  . Hypertension associated with diabetes ([place]) 07/28/2017    Priority: High  . Diabetes mellitus ([place]) 07/28/2017    Priority: High  . Diabetic neuropathy, painful ([place]) 06/22/2012  Priority: High  . Diabetic peripheral neuropathy associated with  type 2 diabetes mellitus ([name]) 11/16/2017    Priority: Medium  . Morbidly obese ([place]) 07/28/2017    Priority: Medium  . Vitamin D deficiency 07/28/2017    Priority: Low  . History of noncompliance with medical treatment 07/28/2017    Priority: Low  . Essential hypertension, benign 06/22/2012  . Pancreatitis, acute 06/22/2012  . Nausea with vomiting 06/22/2012  . Abdominal pain, epigastric 06/22/2012  . Acute hyperkalemia 06/22/2012  . Leukocytosis, unspecified 06/22/2012     Past Medical History:  Diagnosis Date  . Diabetes mellitus without complication ([place])   . Hypertension      Past Surgical History:  Procedure Laterality Date  . APPENDECTOMY    . BACK SURGERY       Family History  Problem Relation Age of Onset  . Cancer Mother   . Diabetes Mother   . Diabetes Father      Social History   Substance and Sexual Activity  Drug Use No  ,  Social History   Substance and Sexual Activity  Alcohol Use Yes   Comment: 3 drinks a month  ,  Social History   Tobacco Use  Smoking Status Former Smoker  . Last attempt to quit: 06/23/1995  . Years since quitting: 22.9  Smokeless Tobacco Current User  . Types: Chew  ,    Current Outpatient Medications on File Prior to Visit  Medication Sig Dispense Refill  . amoxicillin (AMOXIL) 500 MG capsule Take 2 capsules (1,000 mg total) by mouth 3 (three) times daily. 60 capsule 0  . blood glucose meter kit and supplies Dispense based on patient and insurance preference. Use to test glucose fasting in the morning and then 2 hours after largest meal. (FOR ICD-10 E10.9, E11.9). 1 each 0  . predniSONE (DELTASONE) 20 MG tablet Take 3 pills a day for 2 days, 2 pills a day for 2 days, 1 pill a day for 2 days then one half pill a day for 2 days then off 14 tablet 0   No current facility-administered medications on file prior to visit.      No Known Allergies   Review of Systems:   General:  Denies fever,  chills Optho/Auditory:   Denies visual changes, blurred vision Respiratory:   Denies SOB, cough, wheeze, DIB  Cardiovascular:   Denies chest pain, palpitations, painful respirations Gastrointestinal:   Denies nausea, vomiting, diarrhea.  Endocrine:     Denies new hot or cold intolerance Musculoskeletal:  Denies joint swelling, gait issues, or new unexplained myalgias/ arthralgias Skin:  Denies rash, suspicious lesions  Neurological:    Denies dizziness, unexplained weakness, numbness  Psychiatric/Behavioral:   Denies mood changes  Objective:    Blood pressure (!) 162/80, pulse 82, temperature 98.7 F (37.1 C), height 6' (1.829 m), weight 234 lb 12.8 oz (106.5 kg), SpO2 100 %.  Body mass index is 31.84 kg/m.  General: Experiencing facial droop from recent Bell's Palsy.  Well Developed, well nourished, and in no acute distress.  HEENT: Normocephalic, atraumatic, pupils equal round reactive to light, neck supple, No carotid bruits, no JVD Skin: Warm and dry, cap RF less 2 sec Cardiac: Regular rate and rhythm, S1, S2 WNL's, no murmurs rubs or gallops Respiratory: ECTA B/L, Not using accessory muscles, speaking in full sentences. NeuroM-Sk: Ambulates w/o assistance, moves ext * 4 w/o difficulty, sensation grossly intact.  Ext: scant edema b/l lower ext Psych: No HI/SI, judgement and insight good,  Euthymic mood. Full Affect.

## 2018-05-24 NOTE — Patient Instructions (Addendum)
Please check your fasting blood sugar 4 times per week.  Also check your post-prandial blood sugars (2 hours after your biggest meal of the day) 4 times per week, especially after you eat especially healthfully, or especially unhealthfully.  Bring in log of blood pressures and blood sugars to next appointment.       Diabetes Mellitus and Standards of Medical Care  Managing diabetes (diabetes mellitus) can be complicated. Your diabetes treatment may be managed by a team of health care providers, including:  A diet and nutrition specialist (registered dietitian).  A nurse.  A certified diabetes educator (CDE).  A diabetes specialist (endocrinologist).  An eye doctor.  A primary care provider.  A dentist.  Your health care providers follow a schedule in order to help you get the best quality of care. The following schedule is a general guideline for your diabetes management plan. Your health care providers may also give you more specific instructions.  HbA1c (hemoglobin A1c) test This test provides information about blood sugar (glucose) control over the previous 2-3 months. It is used to check whether your diabetes management plan needs to be adjusted.  If you are meeting your treatment goals, this test is done at least 2 times a year.  If you are not meeting treatment goals or if your treatment goals have changed, this test is done 4 times a year.  Blood pressure test  This test is done at every routine medical visit. For most people, the goal is less than 130/80. Ask your health care provider what your goal blood pressure should be.  Dental and eye exams  Visit your dentist two times a year.  If you have type 1 diabetes, get an eye exam 3-5 years after you are diagnosed, and then once a year after your first exam. ? If you were diagnosed with type 1 diabetes as a child, get an eye exam when you are age 73 or older and have had diabetes for 3-5 years. After the first  exam, you should get an eye exam once a year.  If you have type 2 diabetes, have an eye exam as soon as you are diagnosed, and then once a year after your first exam.  Foot care exam  Visual foot exams are done at every routine medical visit. The exams check for cuts, bruises, redness, blisters, sores, or other problems with the feet.  A complete foot exam is done by your health care provider once a year. This exam includes an inspection of the structure and skin of your feet, and a check of the pulses and sensation in your feet. ? Type 1 diabetes: Get your first exam 3-5 years after diagnosis. ? Type 2 diabetes: Get your first exam as soon as you are diagnosed.  Check your feet every day for cuts, bruises, redness, blisters, or sores. If you have any of these or other problems that are not healing, contact your health care provider.  Kidney function test (urine microalbumin)  This test is done once a year. ? Type 1 diabetes: Get your first test 5 years after diagnosis. ? Type 2 diabetes: Get your first test as soon as you are diagnosed._  If you have chronic kidney disease (CKD), get a serum creatinine and estimated glomerular filtration rate (eGFR) test once a year.  Lipid profile (cholesterol, HDL, LDL, triglycerides)  This test should be done when you are diagnosed with diabetes, and every 5 years after the first test. If you are on  medicines to lower your cholesterol, you may need to get this test done every year. ? The goal for LDL is less than 100 mg/dL (5.5 mmol/L). If you are at high risk, the goal is less than 70 mg/dL (3.9 mmol/L). ? The goal for HDL is 40 mg/dL (2.2 mmol/L) for men and 50 mg/dL(2.8 mmol/L) for women. An HDL cholesterol of 60 mg/dL (3.3 mmol/L) or higher gives some protection against heart disease. ? The goal for triglycerides is less than 150 mg/dL (8.3 mmol/L).  Immunizations  The yearly flu (influenza) vaccine is recommended for everyone 6 months or older  who has diabetes.  The pneumonia (pneumococcal) vaccine is recommended for everyone 2 years or older who has diabetes. If you are 86 or older, you may get the pneumonia vaccine as a series of two separate shots.  The hepatitis B vaccine is recommended for adults shortly after they have been diagnosed with diabetes.  The Tdap (tetanus, diphtheria, and pertussis) vaccine should be given: ? According to normal childhood vaccination schedules, for children. ? Every 10 years, for adults who have diabetes.  The shingles vaccine is recommended for people who have had chicken pox and are 50 years or older.  Mental and emotional health  Screening for symptoms of eating disorders, anxiety, and depression is recommended at the time of diagnosis and afterward as needed. If your screening shows that you have symptoms (you have a positive screening result), you may need further evaluation and be referred to a mental health care provider.  Diabetes self-management education  Education about how to manage your diabetes is recommended at diagnosis and ongoing as needed.  Treatment plan  Your treatment plan will be reviewed at every medical visit.  Summary  Managing diabetes (diabetes mellitus) can be complicated. Your diabetes treatment may be managed by a team of health care providers.  Your health care providers follow a schedule in order to help you get the best quality of care.  Standards of care including having regular physical exams, blood tests, blood pressure monitoring, immunizations, screening tests, and education about how to manage your diabetes.  Your health care providers may also give you more specific instructions based on your individual health.      Type 2 Diabetes Mellitus, Self Care, Adult Caring for yourself after you have been diagnosed with type 2 diabetes (type 2 diabetes mellitus) means keeping your blood sugar (glucose) under control with a balance  of:  Nutrition.  Exercise.  Lifestyle changes.  Medicines or insulin, if necessary.  Support from your team of health care providers and others.  The following information explains what you need to know to manage your diabetes at home. What do I need to do to manage my blood glucose?  Check your blood glucose every day, as often as told by your health care provider.  Contact your health care provider if your blood glucose is above your target for 2 tests in a row.  Have your A1c (hemoglobin A1c) level checked at least two times a year, or as often as told by your health care provider. Your health care provider will set individualized treatment goals for you. Generally, the goal of treatment is to maintain the following blood glucose levels:  Before meals (preprandial): 80-130 mg/dL (4.4-7.2 mmol/L).  After meals (postprandial): below 180 mg/dL (10 mmol/L).  A1c level: less than 7%.  What do I need to know about hyperglycemia and hypoglycemia? What is hyperglycemia? Hyperglycemia, also called high blood glucose, occurs  when blood glucose is too high.Make sure you know the early signs of hyperglycemia, such as:  Increased thirst.  Hunger.  Feeling very tired.  Needing to urinate more often than usual.  Blurry vision.  What is hypoglycemia? Hypoglycemia, also called low blood glucose, occurswith a blood glucose level at or below 70 mg/dL (3.9 mmol/L). The risk for hypoglycemia increases during or after exercise, during sleep, during illness, and when skipping meals or not eating for a long time (fasting). It is important to know the symptoms of hypoglycemia and treat it right away. Always have a 15-gram rapid-acting carbohydrate snack with you to treat low blood glucose. Family members and close friends should also know the symptoms and should understand how to treat hypoglycemia, in case you are not able to treat yourself. What are the symptoms of  hypoglycemia? Hypoglycemia symptoms can include:  Hunger.  Anxiety.  Sweating and feeling clammy.  Confusion.  Dizziness or feeling light-headed.  Sleepiness.  Nausea.  Increased heart rate.  Headache.  Blurry vision.  Seizure.  Nightmares.  Tingling or numbness around the mouth, lips, or tongue.  A change in speech.  Decreased ability to concentrate.  A change in coordination.  Restless sleep.  Tremors or shakes.  Fainting.  Irritability.  How do I treat hypoglycemia?  If you are alert and able to swallow safely, follow the 15:15 rule:  Take 15 grams of a rapid-acting carbohydrate. Rapid-acting options include: ? 1 tube of glucose gel. ? 3 glucose pills. ? 6-8 pieces of hard candy. ? 4 oz (120 mL) of fruit juice. ? 4 oz (120 mL) of regular (not diet) soda.  Check your blood glucose 15 minutes after you take the carbohydrate.  If the repeat blood glucose level is still at or below 70 mg/dL (3.9 mmol/L), take 15 grams of a carbohydrate again.  If your blood glucose level does not increase above 70 mg/dL (3.9 mmol/L) after 3 tries, seek emergency medical care.  After your blood glucose level returns to normal, eat a meal or a snack within 1 hour.  How do I treat severe hypoglycemia? Severe hypoglycemia is when your blood glucose level is at or below 54 mg/dL (3 mmol/L). Severe hypoglycemia is an emergency. Do not wait to see if the symptoms will go away. Get medical help right away. Call your local emergency services (911 in the [place] Do not drive yourself to the hospital. If you have severe hypoglycemia and you cannot eat or drink, you may need an injection of glucagon. A family member or close friend should learn how to check your blood glucose and how to give you a glucagon injection. Ask your health care provider if you need to have an emergency glucagon injection kit available. Severe hypoglycemia may need to be treated in a hospital. The treatment  may include getting glucose through an IV tube. You may also need treatment for the cause of your hypoglycemia. Can having diabetes put me at risk for other conditions? Having diabetes can put you at risk for other long-term (chronic) conditions, such as heart disease and kidney disease. Your health care provider may prescribe medicines to help prevent complications from diabetes. These medicines may include:  Aspirin.  Medicine to lower cholesterol.  Medicine to control blood pressure.  What else can I do to manage my diabetes? Take your diabetes medicines as told  If your health care provider prescribed insulin or diabetes medicines, take them every day.  Do not run out of  insulin or other diabetes medicines that you take. Plan ahead so you always have these available.  If you use insulin, adjust your dosage based on how physically active you are and what foods you eat. Your health care provider will tell you how to adjust your dosage. Make healthy food choices  The things that you eat and drink affect your blood glucose and your insulin dosage. Making good choices helps to control your diabetes and prevent other health problems. A healthy meal plan includes eating lean proteins, complex carbohydrates, fresh fruits and vegetables, low-fat dairy products, and healthy fats. Make an appointment to see a diet and nutrition specialist (registered dietitian) to help you create an eating plan that is right for you. Make sure that you:  Follow instructions from your health care provider about eating or drinking restrictions.  Drink enough fluid to keep your urine clear or pale yellow.  Eat healthy snacks between nutritious meals.  Track the carbohydrates that you eat. Do this by reading food labels and learning the standard serving sizes of foods.  Follow your sick day plan whenever you cannot eat or drink as usual. Make this plan in advance with your health care provider.  Stay  active  Exercise regularly, as told by your health care provider. This may include:  Stretching and doing strength exercises, such as yoga or weightlifting, at least 2 times a week.  Doing at least 150 minutes of moderate-intensity or vigorous-intensity exercise each week. This could be brisk walking, biking, or water aerobics. ? Spread out your activity over at least 3 days of the week. ? Do not go more than 2 days in a row without doing some kind of physical activity.  When you start a new exercise or activity, work with your health care provider to adjust your insulin, medicines, or food intake as needed. Make healthy lifestyle choices  Do not use any tobacco products, such as cigarettes, chewing tobacco, and e-cigarettes. If you need help quitting, ask your health care provider.  If your health care provider says that alcohol is safe for you, limit alcohol intake to no more than 1 drink per day for nonpregnant women and 2 drinks per day for men. One drink equals 12 oz of beer, 5 oz of wine, or 1 oz of hard liquor.  Learn to manage stress. If you need help with this, ask your health care provider. Care for your body   Keep your immunizations up to date. In addition to getting vaccinations as told by your health care provider, it is recommended that you get vaccinated against the following illnesses: ? The flu (influenza). Get a flu shot every year. ? Pneumonia. ? Hepatitis B.  Schedule an eye exam soon after your diagnosis, and then one time every year after that.  Check your skin and feet every day for cuts, bruises, redness, blisters, or sores. Schedule a foot exam with your health care provider once every year.  Brush your teeth and gums two times a day, and floss at least one time a day. Visit your dentist at least once every 6 months.  Maintain a healthy weight. General instructions  Take over-the-counter and prescription medicines only as told by your health care  provider.  Share your diabetes management plan with people in your workplace, school, and household.  Check your urine for ketones when you are ill and as told by your health care provider.  Ask your health care provider: ? Do I need to meet  with a diabetes educator? ? Where can I find a support group for people with diabetes?  Carry a medical alert card or wear medical alert jewelry.  Keep all follow-up visits as told by your health care provider. This is important. Where to find more information: For more information about diabetes, visit:  American Diabetes Association (ADA): www.diabetes.org  American Association of Diabetes Educators (AADE): www.diabeteseducator.org/patient-resources  This information is not intended to replace advice given to you by your health care provider. Make sure you discuss any questions you have with your health care provider. Document Released: 08/11/2015 Document Revised: 09/25/2015 Document Reviewed: 05/23/2015 Elsevier Interactive Patient Education  2017 [employer].      Blood Glucose Monitoring, Adult Monitoring your blood sugar (glucose) helps you manage your diabetes. It also helps you and your health care provider determine how well your diabetes management plan is working. Blood glucose monitoring involves checking your blood glucose as often as directed, and keeping a record (log) of your results over time. Why should I monitor my blood glucose? Checking your blood glucose regularly can:  Help you understand how food, exercise, illnesses, and medicines affect your blood glucose.  Let you know what your blood glucose is at any time. You can quickly tell if you are having low blood glucose (hypoglycemia) or high blood glucose (hyperglycemia).  Help you and your health care provider adjust your medicines as needed.  When should I check my blood glucose? Follow instructions from your health care provider about how often to check your  blood glucose.   This may depend on:  The type of diabetes you have.  How well-controlled your diabetes is.  Medicines you are taking.  If you have type 1 diabetes:  Check your blood glucose at least 2 times a day.  Also check your blood glucose: ? Before every insulin injection. ? Before and after exercise. ? Between meals. ? 2 hours after a meal. ? Occasionally between 2:00 a.m. and 3:00 a.m., as directed. ? Before potentially dangerous tasks, like driving or using heavy machinery. ? At bedtime.  You may need to check your blood glucose more often, up to 6-10 times a day: ? If you use an insulin pump. ? If you need multiple daily injections (MDI). ? If your diabetes is not well-controlled. ? If you are ill. ? If you have a history of severe hypoglycemia. ? If you have a history of not knowing when your blood glucose is getting low (hypoglycemia unawareness).  If you have type 2 diabetes:  If you take insulin or other diabetes medicines, check your blood glucose at least 2 times a day.  If you are on intensive insulin therapy, check your blood glucose at least 4 times a day. Occasionally, you may also need to check between 2:00 a.m. and 3:00 a.m., as directed.  Also check your blood glucose: ? Before and after exercise. ? Before potentially dangerous tasks, like driving or using heavy machinery.  You may need to check your blood glucose more often if: ? Your medicine is being adjusted. ? Your diabetes is not well-controlled. ? You are ill.  What is a blood glucose log?  A blood glucose log is a record of your blood glucose readings. It helps you and your health care provider: ? Look for patterns in your blood glucose over time. ? Adjust your diabetes management plan as needed.  Every time you check your blood glucose, write down your result and notes about things that  may be affecting your blood glucose, such as your diet and exercise for the day.  Most glucose  meters store a record of glucose readings in the meter. Some meters allow you to download your records to a computer. How do I check my blood glucose? Follow these steps to get accurate readings of your blood glucose: Supplies needed   Blood glucose meter.  Test strips for your meter. Each meter has its own strips. You must use the strips that come with your meter.  A needle to prick your finger (lancet). Do not use lancets more than once.  A device that holds the lancet (lancing device).  A journal or log book to write down your results.  Procedure  Wash your hands with soap and water.  Prick the side of your finger (not the tip) with the lancet. Use a different finger each time.  Gently rub the finger until a small drop of blood appears.  Follow instructions that come with your meter for inserting the test strip, applying blood to the strip, and using your blood glucose meter.  Write down your result and any notes.  Alternative testing sites  Some meters allow you to use areas of your body other than your finger (alternative sites) to test your blood.  If you think you may have hypoglycemia, or if you have hypoglycemia unawareness, do not use alternative sites. Use your finger instead.  Alternative sites may not be as accurate as the fingers, because blood flow is slower in these areas. This means that the result you get may be delayed, and it may be different from the result that you would get from your finger.  The most common alternative sites are: ? Forearm. ? Thigh. ? Palm of the hand.  Additional tips  Always keep your supplies with you.  If you have questions or need help, all blood glucose meters have a 24-hour hotline number that you can call. You may also contact your health care provider.  After you use a few boxes of test strips, adjust (calibrate) your blood glucose meter by following instructions that came with your meter.    The American Diabetes  Association suggests the following targets for most nonpregnant adults with diabetes.  More or less stringent glycemic goals may be appropriate for each individual.  A1C: Less than 7% A1C may also be reported as eAG: Less than 154 mg/dl Before a meal (preprandial plasma glucose): 80-130 mg/dl 1-2 hours after beginning of the meal (Postprandial plasma glucose)*: Less than 180 mg/dl  *Postprandial glucose may be targeted if A1C goals are not met despite reaching preprandial glucose goals.   GOALS in short:  The goals are for the Hgb A1C to be less than 7.0 & blood pressure to be less than 130/80.    It is recommended that all diabetics are educated on and follow a healthy diabetic diet, exercise for 30 minutes 3-4 times per week (walking, biking, swimming, or machine), monitor blood glucose readings and bring that record with you to be reviewed at your next office visit.     You should be checking fasting blood sugars- especially after you eat poorly or eat really healthy, and also check 2 hour postprandial blood sugars after largest meal of the day.    Write these down and bring in your log at each office visit.    You will need to be seen every 3 months by the provider managing your Diabetes unless told otherwise by that provider.  You will need yearly eye exams from an eye specialist and foot exams to check the nerves of your feet.  Also, your urine should be checked yearly as well to make sure excess protein is not present.   If you are checking your blood pressure at home, please record it and bring it to your next office visit.    Follow the Dietary Approaches to Stop Hypertension (DASH) diet (3 servings of fruit and vegetables daily, whole grains, low sodium, low-fat proteins).  See below.    Lastly, when it comes to your cholesterol, the goal is to have the HDL (good cholesterol) >40, and the LDL (bad cholesterol) <100.   It is recommended that you follow a heart healthy, low saturated  and trans-fat diet and exercise for 30 minutes at least 5 times a week.     (( Check out the DASH diet = 1.5 Gram Low Sodium Diet   A 1.5 gram sodium diet restricts the amount of sodium in the diet to no more than 1.5 g or 1500 mg daily.  The American Heart Association recommends Americans over the age of 21 to consume no more than 1500 mg of sodium each day to reduce the risk of developing high blood pressure.  Research also shows that limiting sodium may reduce heart attack and stroke risk.  Many foods contain sodium for flavor and sometimes as a preservative.  When the amount of sodium in a diet needs to be low, it is important to know what to look for when choosing foods and drinks.  The following includes some information and guidelines to help make it easier for you to adapt to a low sodium diet.    QUICK TIPS  Do not add salt to food.  Avoid convenience items and fast food.  Choose unsalted snack foods.  Buy lower sodium products, often labeled as "lower sodium" or "no salt added."  Check food labels to learn how much sodium is in 1 serving.  When eating at a restaurant, ask that your food be prepared with less salt or none, if possible.    READING FOOD LABELS FOR SODIUM INFORMATION  The nutrition facts label is a good place to find how much sodium is in foods. Look for products with no more than 400 mg of sodium per serving.  Remember that 1.5 g = 1500 mg.  The food label may also list foods as:  Sodium-free: Less than 5 mg in a serving.  Very low sodium: 35 mg or less in a serving.  Low-sodium: 140 mg or less in a serving.  Light in sodium: 50% less sodium in a serving. For example, if a food that usually has 300 mg of sodium is changed to become light in sodium, it will have 150 mg of sodium.  Reduced sodium: 25% less sodium in a serving. For example, if a food that usually has 400 mg of sodium is changed to reduced sodium, it will have 300 mg of sodium.    CHOOSING FOODS   Grains  Avoid: Salted crackers and snack items. Some cereals, including instant hot cereals. Bread stuffing and biscuit mixes. Seasoned rice or pasta mixes.  Choose: Unsalted snack items. Low-sodium cereals, oats, puffed wheat and rice, shredded wheat. English muffins and bread. Pasta.  Meats  Avoid: Salted, canned, smoked, spiced, pickled meats, including fish and poultry. Bacon, ham, sausage, cold cuts, hot dogs, anchovies.  Choose: Low-sodium canned tuna and salmon. Fresh or frozen meat, poultry, and fish.  Dairy  Avoid: Processed cheese and spreads. Cottage cheese. Buttermilk and condensed milk. Regular cheese.  Choose: Milk. Low-sodium cottage cheese. Yogurt. Sour cream. Low-sodium cheese.  Fruits and Vegetables  Avoid: Regular canned vegetables. Regular canned tomato sauce and paste. Frozen vegetables in sauces. Olives. Angie Fava. Relishes. Sauerkraut.  Choose: Low-sodium canned vegetables. Low-sodium tomato sauce and paste. Frozen or fresh vegetables. Fresh and frozen fruit.  Condiments  Avoid: Canned and packaged gravies. Worcestershire sauce. Tartar sauce. Barbecue sauce. Soy sauce. Steak sauce. Ketchup. Onion, garlic, and table salt. Meat flavorings and tenderizers.  Choose: Fresh and dried herbs and spices. Low-sodium varieties of mustard and ketchup. Lemon juice. Tabasco sauce. Horseradish.    SAMPLE 1.5 GRAM SODIUM MEAL PLAN:   Breakfast / Sodium (mg)  1 cup low-fat milk / 143 mg  1 whole-wheat English muffin / 240 mg  1 tbs heart-healthy margarine / 153 mg  1 hard-boiled egg / 139 mg  1 small orange / 0 mg  Lunch / Sodium (mg)  1 cup raw carrots / 76 mg  2 tbs no salt added peanut butter / 5 mg  2 slices whole-wheat bread / 270 mg  1 tbs jelly / 6 mg   cup red grapes / 2 mg  Dinner / Sodium (mg)  1 cup whole-wheat pasta / 2 mg  1 cup low-sodium tomato sauce / 73 mg  3 oz lean ground beef / 57 mg  1 small side salad (1 cup raw spinach leaves,  cup cucumber,  cup  yellow bell pepper) with 1 tsp olive oil and 1 tsp red wine vinegar / 25 mg  Snack / Sodium (mg)  1 container low-fat vanilla yogurt / 107 mg  3 graham cracker squares / 127 mg  Nutrient Analysis  Calories: 1745  Protein: 75 g  Carbohydrate: 237 g  Fat: 57 g  Sodium: 1425 mg  Document Released: 04/19/2005 Document Revised: 12/30/2010 Document Reviewed: 07/21/2009  ExitCare Patient Information 2012 [place].))    This information is not intended to replace advice given to you by your health care provider. Make sure you discuss any questions you have with your health care provider. Document Released: 04/22/2003 Document Revised: 11/07/2015 Document Reviewed: 09/29/2015 Elsevier Interactive Patient Education  2017 [employer].

## 2018-06-21 ENCOUNTER — Ambulatory Visit: Payer: Self-pay | Admitting: Endocrinology

## 2018-06-21 ENCOUNTER — Telehealth: Payer: Self-pay | Admitting: Endocrinology

## 2018-06-21 NOTE — Telephone Encounter (Signed)
Patient no showed today's appt. Please advise on how to follow up. °A. No follow up necessary. °B. Follow up urgent. Contact patient immediately. °C. Follow up necessary. Contact patient and schedule visit in ___ days. °D. Follow up advised. Contact patient and schedule visit in ____weeks. ° °Would you like the NS fee to be applied to this visit? ° °

## 2018-06-22 NOTE — Telephone Encounter (Signed)
Please notify PCP office about no-show and they will need to refer the patient again if he needs to be seen

## 2018-06-28 NOTE — Telephone Encounter (Signed)
Please call the patient and let him know he needs to be seen by an endocrinologist.  Ask him if he would like to go to someone else besides Dr. Ronnie Derby practice and please place referral.  Thank you.

## 2018-06-28 NOTE — Telephone Encounter (Signed)
Called patient and left message to call the office. MPulliam, CMA/RT(R)  

## 2018-06-29 NOTE — Telephone Encounter (Signed)
Called patient left message to call the office. MPulliam, CMA/RT(R)  

## 2018-07-04 NOTE — Telephone Encounter (Signed)
Patient has office visit tomorrow, will notify him at that time since unable to reach him by phone. MPulliam, CMA/RT(R)

## 2018-07-05 ENCOUNTER — Ambulatory Visit: Payer: BC Managed Care – PPO | Admitting: Family Medicine

## 2018-07-11 ENCOUNTER — Encounter: Payer: Self-pay | Admitting: Adult Health

## 2018-07-11 ENCOUNTER — Ambulatory Visit (INDEPENDENT_AMBULATORY_CARE_PROVIDER_SITE_OTHER): Payer: BC Managed Care – PPO | Admitting: Adult Health

## 2018-07-11 VITALS — BP 136/76 | HR 93 | Temp 98.9°F | Ht 72.0 in | Wt 229.3 lb

## 2018-07-11 DIAGNOSIS — Z0000 Encounter for general adult medical examination without abnormal findings: Secondary | ICD-10-CM | POA: Diagnosis not present

## 2018-07-11 DIAGNOSIS — L97512 Non-pressure chronic ulcer of other part of right foot with fat layer exposed: Secondary | ICD-10-CM

## 2018-07-11 DIAGNOSIS — E08621 Diabetes mellitus due to underlying condition with foot ulcer: Secondary | ICD-10-CM | POA: Diagnosis not present

## 2018-07-11 NOTE — Assessment & Plan Note (Signed)
If you hernia or swallowing worsens, please schedule a follow-up with Dr. Raliegh Scarlet. Continue to keep good control on your diabetes

## 2018-07-11 NOTE — Patient Instructions (Addendum)
Diabetes Mellitus and Foot Care Foot care is an important part of your health, especially when you have diabetes. Diabetes may cause you to have problems because of poor blood flow (circulation) to your feet and legs, which can cause your skin to:  Become thinner and drier.  Break more easily.  Heal more slowly.  Peel and crack. You may also have nerve damage (neuropathy) in your legs and feet, causing decreased feeling in them. This means that you may not notice minor injuries to your feet that could lead to more serious problems. Noticing and addressing any potential problems early is the best way to prevent future foot problems. How to care for your feet Foot hygiene  Wash your feet daily with warm water and mild soap. Do not use hot water. Then, pat your feet and the areas between your toes until they are completely dry. Do not soak your feet as this can dry your skin.  Trim your toenails straight across. Do not dig under them or around the cuticle. File the edges of your nails with an emery board or nail file.  Apply a moisturizing lotion or petroleum jelly to the skin on your feet and to dry, brittle toenails. Use lotion that does not contain alcohol and is unscented. Do not apply lotion between your toes. Shoes and socks  Wear clean socks or stockings every day. Make sure they are not too tight. Do not wear knee-high stockings since they may decrease blood flow to your legs.  Wear shoes that fit properly and have enough cushioning. Always look in your shoes before you put them on to be sure there are no objects inside.  To break in new shoes, wear them for just a few hours a day. This prevents injuries on your feet. Wounds, scrapes, corns, and calluses  Check your feet daily for blisters, cuts, bruises, sores, and redness. If you cannot see the bottom of your feet, use a mirror or ask someone for help.  Do not cut corns or calluses or try to remove them with medicine.  If you  find a minor scrape, cut, or break in the skin on your feet, keep it and the skin around it clean and dry. You may clean these areas with mild soap and water. Do not clean the area with peroxide, alcohol, or iodine.  If you have a wound, scrape, corn, or callus on your foot, look at it several times a day to make sure it is healing and not infected. Check for: ? Redness, swelling, or pain. ? Fluid or blood. ? Warmth. ? Pus or a bad smell. General instructions  Do not cross your legs. This may decrease blood flow to your feet.  Do not use heating pads or hot water bottles on your feet. They may burn your skin. If you have lost feeling in your feet or legs, you may not know this is happening until it is too late.  Protect your feet from hot and cold by wearing shoes, such as at the beach or on hot pavement.  Schedule a complete foot exam at least once a year (annually) or more often if you have foot problems. If you have foot problems, report any cuts, sores, or bruises to your health care provider immediately. Contact a health care provider if:  You have a medical condition that increases your risk of infection and you have any cuts, sores, or bruises on your feet.  You have an injury that is not   healing.  You have redness on your legs or feet.  You feel burning or tingling in your legs or feet.  You have pain or cramps in your legs and feet.  Your legs or feet are numb.  Your feet always feel cold.  You have pain around a toenail. Get help right away if:  You have a wound, scrape, corn, or callus on your foot and: ? You have pain, swelling, or redness that gets worse. ? You have fluid or blood coming from the wound, scrape, corn, or callus. ? Your wound, scrape, corn, or callus feels warm to the touch. ? You have pus or a bad smell coming from the wound, scrape, corn, or callus. ? You have a fever. ? You have a red line going up your leg. Summary  Check your feet every day  for cuts, sores, red spots, swelling, and blisters.  Moisturize feet and legs daily.  Wear shoes that fit properly and have enough cushioning.  If you have foot problems, report any cuts, sores, or bruises to your health care provider immediately.  Schedule a complete foot exam at least once a year (annually) or more often if you have foot problems. This information is not intended to replace advice given to you by your health care provider. Make sure you discuss any questions you have with your health care provider. Document Released: 04/16/2000 Document Revised: 06/01/2017 Document Reviewed: 05/21/2016 Elsevier Interactive Patient Education  2019 [employer].  Please take Doxycyline as directed. Keep wound clean and dry. We will call you when wound culture results are available. Referral to wound care placed, someone will call you to schedule an appt. If you hernia or swallowing worsens, please schedule a follow-up with Dr. Raliegh Scarlet. Continue to keep good control on your diabetes. FEEL BETTER!

## 2018-07-11 NOTE — Assessment & Plan Note (Addendum)
Please take Doxycyline as directed. Keep wound clean and dry. We will call you when wound culture results are available. Referral to wound care placed, someone will call you to schedule an appt. Marland Kitchen

## 2018-07-11 NOTE — Progress Notes (Signed)
Subjective:    Patient ID: [name], male    DOB: [birth year], 53 y.o.   MRN: [id number]  HPI:  [name] presents with R foot ulcer. He reports "pulling a piece of skin off the bottom of my foot" 4 weeks ago and the spot has never healed. He reports daily serousanguinous drainage, however denies pain He report AM BG 110-120s Lab Results  Component Value Date   HGBA1C 6.7 (H) 05/18/2018   HGBA1C 6.1 (A) 11/16/2017   HGBA1C 7.2 (H) 07/28/2017  He has not used any OTC remedies for pain control He has been cleaning foot with Hydrogen Peroxide  He denies fever/night sweats/malaise   Patient Care Team    Relationship Specialty Notifications Start End  Jeffrey Dance, DO PCP - General Family Medicine  07/28/17   [name], [place]  General Practice  07/29/17     Patient Active Problem List   Diagnosis Date Noted  . Diabetic ulcer of toe of right foot associated with diabetes mellitus due to underlying condition, with fat layer exposed ([place]) 07/11/2018  . Diabetic peripheral neuropathy associated with type 2 diabetes mellitus ([place]) 11/16/2017  . Mixed diabetic hyperlipidemia associated with type 2 diabetes mellitus ([place]) 11/16/2017  . Hypertension associated with diabetes ([place]) 07/28/2017  . Vitamin D deficiency 07/28/2017  . Diabetes mellitus ([place]) 07/28/2017  . History of noncompliance with medical treatment 07/28/2017  . Morbidly obese ([place]) 07/28/2017  . Essential hypertension, benign 06/22/2012  . Pancreatitis, acute 06/22/2012  . Nausea with vomiting 06/22/2012  . Abdominal pain, epigastric 06/22/2012  . Acute hyperkalemia 06/22/2012  . Leukocytosis, unspecified 06/22/2012  . Diabetic neuropathy, painful ([place]) 06/22/2012     Past Medical History:  Diagnosis Date  . Diabetes mellitus without complication ([place])   . Hypertension      Past Surgical History:  Procedure Laterality Date  . APPENDECTOMY    . BACK SURGERY       Family History  Problem Relation  Age of Onset  . Cancer Mother   . Diabetes Mother   . Diabetes Father      Social History   Substance and Sexual Activity  Drug Use No     Social History   Substance and Sexual Activity  Alcohol Use Yes   Comment: 3 drinks a month     Social History   Tobacco Use  Smoking Status Former Smoker  . Last attempt to quit: 06/23/1995  . Years since quitting: 23.0  Smokeless Tobacco Current User  . Types: Chew     Outpatient Encounter Medications as of 07/11/2018  Medication Sig  . atorvastatin (LIPITOR) 20 MG tablet Take 1 tablet (20 mg total) by mouth at bedtime.  . blood glucose meter kit and supplies Dispense based on patient and insurance preference. Use to test glucose fasting in the morning and then 2 hours after largest meal. (FOR ICD-10 E10.9, E11.9).  Marland Kitchen ergocalciferol (VITAMIN D2) 1.25 MG (50000 UT) capsule Take 1 capsule (50,000 Units total) by mouth 2 (two) times a week. Wednesday and Sunday  . gabapentin (NEURONTIN) 600 MG tablet Take 1 tablet (600 mg total) by mouth 3 (three) times daily.  Marland Kitchen losartan (COZAAR) 50 MG tablet Take 1 tablet (50 mg total) by mouth daily.  . metFORMIN (GLUCOPHAGE) 500 MG tablet Take 1 tablet (500 mg total) by mouth 2 (two) times daily with a meal.  . [DISCONTINUED] amoxicillin (AMOXIL) 500 MG capsule Take 2 capsules (1,000 mg total) by mouth 3 (three) times  daily.  . [DISCONTINUED] predniSONE (DELTASONE) 20 MG tablet Take 3 pills a day for 2 days, 2 pills a day for 2 days, 1 pill a day for 2 days then one half pill a day for 2 days then off   No facility-administered encounter medications on file as of 07/11/2018.     Allergies: Patient has no known allergies.  Body mass index is 31.1 kg/m.  Blood pressure 136/76, pulse 93, temperature 98.9 F (37.2 C), temperature source Oral, height 6' (1.829 m), weight 229 lb 4.8 oz (104 kg), SpO2 100 %.  Review of Systems  Constitutional: Positive for fatigue. Negative for activity change,  appetite change, chills, diaphoresis, fever and unexpected weight change.  Respiratory: Negative for cough, chest tightness, shortness of breath, wheezing and stridor.   Cardiovascular: Negative for chest pain, palpitations and leg swelling.  Skin: Positive for color change and wound.  Hematological: Does not bruise/bleed easily.       Objective:   Physical Exam Constitutional:      General: He is not in acute distress.    Appearance: He is not ill-appearing, toxic-appearing or diaphoretic.  Skin:    General: Skin is warm and dry.     Findings: Wound present.     Comments: R foot- plantar aspect Ball of foot:4cm area of hardened callus with central hole- approx >0.5cm deep Area swabbed No excessive warmth noted Area around hole darkened- brown/Garfinkle tissue  Neurological:     Mental Status: He is alert and oriented to person, place, and time.  Psychiatric:        Mood and Affect: Mood normal.        Behavior: Behavior normal.        Thought Content: Thought content normal.        Judgment: Judgment normal.       Assessment & Plan:   1. Diabetic ulcer of toe of right foot associated with diabetes mellitus due to underlying condition, with fat layer exposed ([place])     Diabetic ulcer of toe of right foot associated with diabetes mellitus due to underlying condition, with fat layer exposed ([place]) Please take Doxycyline as directed. Keep wound clean and dry. We will call you when wound culture results are available. Referral to wound care placed, someone will call you to schedule an appt. If you hernia or swallowing worsens, please schedule a follow-up with Dr. Raliegh Rivas. Continue to keep good control on your diabetes.    FOLLOW-UP:  Return if symptoms worsen or fail to improve.

## 2018-07-14 LAB — WOUND CULTURE

## 2018-07-25 ENCOUNTER — Other Ambulatory Visit: Payer: Self-pay

## 2018-07-25 ENCOUNTER — Encounter (HOSPITAL_BASED_OUTPATIENT_CLINIC_OR_DEPARTMENT_OTHER): Payer: BC Managed Care – PPO | Attending: Internal Medicine

## 2018-07-25 DIAGNOSIS — B952 Enterococcus as the cause of diseases classified elsewhere: Secondary | ICD-10-CM | POA: Insufficient documentation

## 2018-07-25 DIAGNOSIS — L97512 Non-pressure chronic ulcer of other part of right foot with fat layer exposed: Secondary | ICD-10-CM | POA: Insufficient documentation

## 2018-07-25 DIAGNOSIS — Z7984 Long term (current) use of oral hypoglycemic drugs: Secondary | ICD-10-CM | POA: Diagnosis not present

## 2018-07-25 DIAGNOSIS — B9620 Unspecified Escherichia coli [E. coli] as the cause of diseases classified elsewhere: Secondary | ICD-10-CM | POA: Insufficient documentation

## 2018-07-25 DIAGNOSIS — L03115 Cellulitis of right lower limb: Secondary | ICD-10-CM | POA: Diagnosis not present

## 2018-07-25 DIAGNOSIS — Z87891 Personal history of nicotine dependence: Secondary | ICD-10-CM | POA: Diagnosis not present

## 2018-07-25 DIAGNOSIS — E1142 Type 2 diabetes mellitus with diabetic polyneuropathy: Secondary | ICD-10-CM | POA: Insufficient documentation

## 2018-07-25 DIAGNOSIS — I10 Essential (primary) hypertension: Secondary | ICD-10-CM | POA: Diagnosis not present

## 2018-07-25 DIAGNOSIS — G4730 Sleep apnea, unspecified: Secondary | ICD-10-CM | POA: Diagnosis not present

## 2018-07-25 DIAGNOSIS — E11621 Type 2 diabetes mellitus with foot ulcer: Secondary | ICD-10-CM | POA: Insufficient documentation

## 2018-07-26 ENCOUNTER — Other Ambulatory Visit (HOSPITAL_COMMUNITY)
Admission: RE | Admit: 2018-07-26 | Discharge: 2018-07-26 | Disposition: A | Discharge status: Home / Self Care | Payer: BC Managed Care – PPO | Source: Ambulatory Visit | Attending: Internal Medicine | Admitting: Internal Medicine

## 2018-07-26 DIAGNOSIS — E11621 Type 2 diabetes mellitus with foot ulcer: Secondary | ICD-10-CM | POA: Diagnosis present

## 2018-07-28 ENCOUNTER — Other Ambulatory Visit: Payer: Self-pay

## 2018-07-28 ENCOUNTER — Ambulatory Visit (HOSPITAL_COMMUNITY)
Admission: RE | Admit: 2018-07-28 | Discharge: 2018-07-28 | Disposition: A | Discharge status: Home / Self Care | Payer: BC Managed Care – PPO | Source: Ambulatory Visit | Attending: Internal Medicine | Admitting: Internal Medicine

## 2018-07-28 ENCOUNTER — Other Ambulatory Visit (HOSPITAL_COMMUNITY): Payer: Self-pay | Admitting: Internal Medicine

## 2018-07-28 DIAGNOSIS — L97921 Non-pressure chronic ulcer of unspecified part of left lower leg limited to breakdown of skin: Secondary | ICD-10-CM | POA: Diagnosis present

## 2018-07-28 DIAGNOSIS — E11621 Type 2 diabetes mellitus with foot ulcer: Secondary | ICD-10-CM | POA: Diagnosis not present

## 2018-07-29 LAB — AEROBIC CULTURE W GRAM STAIN (SUPERFICIAL SPECIMEN)

## 2018-08-01 DIAGNOSIS — E11621 Type 2 diabetes mellitus with foot ulcer: Secondary | ICD-10-CM | POA: Diagnosis not present

## 2018-08-08 ENCOUNTER — Encounter (HOSPITAL_BASED_OUTPATIENT_CLINIC_OR_DEPARTMENT_OTHER): Payer: BC Managed Care – PPO | Attending: Internal Medicine

## 2018-08-08 DIAGNOSIS — I10 Essential (primary) hypertension: Secondary | ICD-10-CM | POA: Insufficient documentation

## 2018-08-08 DIAGNOSIS — G4730 Sleep apnea, unspecified: Secondary | ICD-10-CM | POA: Insufficient documentation

## 2018-08-08 DIAGNOSIS — B952 Enterococcus as the cause of diseases classified elsewhere: Secondary | ICD-10-CM | POA: Diagnosis not present

## 2018-08-08 DIAGNOSIS — E11621 Type 2 diabetes mellitus with foot ulcer: Secondary | ICD-10-CM | POA: Diagnosis present

## 2018-08-08 DIAGNOSIS — B9620 Unspecified Escherichia coli [E. coli] as the cause of diseases classified elsewhere: Secondary | ICD-10-CM | POA: Insufficient documentation

## 2018-08-08 DIAGNOSIS — L97512 Non-pressure chronic ulcer of other part of right foot with fat layer exposed: Secondary | ICD-10-CM | POA: Diagnosis not present

## 2018-08-08 DIAGNOSIS — E1140 Type 2 diabetes mellitus with diabetic neuropathy, unspecified: Secondary | ICD-10-CM | POA: Diagnosis not present

## 2018-08-15 ENCOUNTER — Other Ambulatory Visit: Payer: Self-pay | Admitting: Family Medicine

## 2018-08-15 DIAGNOSIS — E1140 Type 2 diabetes mellitus with diabetic neuropathy, unspecified: Secondary | ICD-10-CM

## 2018-08-18 DIAGNOSIS — E11621 Type 2 diabetes mellitus with foot ulcer: Secondary | ICD-10-CM | POA: Diagnosis not present

## 2018-08-22 DIAGNOSIS — E11621 Type 2 diabetes mellitus with foot ulcer: Secondary | ICD-10-CM | POA: Diagnosis not present

## 2018-08-25 ENCOUNTER — Other Ambulatory Visit (HOSPITAL_COMMUNITY)
Admission: RE | Admit: 2018-08-25 | Discharge: 2018-08-25 | Disposition: A | Discharge status: Home / Self Care | Payer: BC Managed Care – PPO | Source: Other Acute Inpatient Hospital | Attending: Internal Medicine | Admitting: Internal Medicine

## 2018-08-25 DIAGNOSIS — L97512 Non-pressure chronic ulcer of other part of right foot with fat layer exposed: Secondary | ICD-10-CM | POA: Diagnosis present

## 2018-08-25 DIAGNOSIS — E11621 Type 2 diabetes mellitus with foot ulcer: Secondary | ICD-10-CM | POA: Insufficient documentation

## 2018-08-29 LAB — AEROBIC CULTURE W GRAM STAIN (SUPERFICIAL SPECIMEN)

## 2018-08-29 LAB — AEROBIC CULTURE? (SUPERFICIAL SPECIMEN)

## 2018-08-31 ENCOUNTER — Other Ambulatory Visit: Payer: Self-pay

## 2018-08-31 ENCOUNTER — Other Ambulatory Visit (HOSPITAL_COMMUNITY): Payer: Self-pay | Admitting: Internal Medicine

## 2018-08-31 ENCOUNTER — Ambulatory Visit (HOSPITAL_COMMUNITY)
Admission: RE | Admit: 2018-08-31 | Discharge: 2018-08-31 | Disposition: A | Discharge status: Home / Self Care | Payer: BC Managed Care – PPO | Source: Ambulatory Visit | Attending: Internal Medicine | Admitting: Internal Medicine

## 2018-08-31 DIAGNOSIS — L97509 Non-pressure chronic ulcer of other part of unspecified foot with unspecified severity: Secondary | ICD-10-CM | POA: Diagnosis present

## 2018-08-31 DIAGNOSIS — E11621 Type 2 diabetes mellitus with foot ulcer: Secondary | ICD-10-CM

## 2018-08-31 DIAGNOSIS — E1169 Type 2 diabetes mellitus with other specified complication: Secondary | ICD-10-CM | POA: Diagnosis present

## 2018-08-31 DIAGNOSIS — M869 Osteomyelitis, unspecified: Secondary | ICD-10-CM | POA: Insufficient documentation

## 2018-09-01 ENCOUNTER — Encounter (HOSPITAL_BASED_OUTPATIENT_CLINIC_OR_DEPARTMENT_OTHER): Payer: BC Managed Care – PPO | Attending: Internal Medicine

## 2018-09-01 DIAGNOSIS — L03115 Cellulitis of right lower limb: Secondary | ICD-10-CM | POA: Diagnosis not present

## 2018-09-01 DIAGNOSIS — E11621 Type 2 diabetes mellitus with foot ulcer: Secondary | ICD-10-CM | POA: Diagnosis present

## 2018-09-01 DIAGNOSIS — L97512 Non-pressure chronic ulcer of other part of right foot with fat layer exposed: Secondary | ICD-10-CM | POA: Insufficient documentation

## 2018-09-01 DIAGNOSIS — G4730 Sleep apnea, unspecified: Secondary | ICD-10-CM | POA: Insufficient documentation

## 2018-09-01 DIAGNOSIS — E1140 Type 2 diabetes mellitus with diabetic neuropathy, unspecified: Secondary | ICD-10-CM | POA: Insufficient documentation

## 2018-09-01 DIAGNOSIS — I10 Essential (primary) hypertension: Secondary | ICD-10-CM | POA: Diagnosis not present

## 2018-09-01 DIAGNOSIS — B957 Other staphylococcus as the cause of diseases classified elsewhere: Secondary | ICD-10-CM | POA: Diagnosis not present

## 2018-09-08 ENCOUNTER — Other Ambulatory Visit: Payer: Self-pay | Admitting: Internal Medicine

## 2018-09-08 ENCOUNTER — Other Ambulatory Visit (HOSPITAL_COMMUNITY): Payer: Self-pay | Admitting: Internal Medicine

## 2018-09-08 DIAGNOSIS — E11621 Type 2 diabetes mellitus with foot ulcer: Secondary | ICD-10-CM | POA: Diagnosis not present

## 2018-09-08 DIAGNOSIS — L97509 Non-pressure chronic ulcer of other part of unspecified foot with unspecified severity: Secondary | ICD-10-CM

## 2018-09-11 ENCOUNTER — Ambulatory Visit (HOSPITAL_COMMUNITY)
Admission: RE | Admit: 2018-09-11 | Discharge: 2018-09-11 | Disposition: A | Discharge status: Home / Self Care | Payer: BC Managed Care – PPO | Source: Ambulatory Visit | Attending: Internal Medicine | Admitting: Internal Medicine

## 2018-09-11 ENCOUNTER — Other Ambulatory Visit: Payer: Self-pay

## 2018-09-11 DIAGNOSIS — L97509 Non-pressure chronic ulcer of other part of unspecified foot with unspecified severity: Secondary | ICD-10-CM

## 2018-09-11 DIAGNOSIS — E11621 Type 2 diabetes mellitus with foot ulcer: Secondary | ICD-10-CM | POA: Insufficient documentation

## 2018-09-11 LAB — POCT I-STAT CREATININE: Creatinine, Ser: 0.8 mg/dL (ref 0.61–1.24)

## 2018-09-11 MED ORDER — GADOBUTROL 1 MMOL/ML IV SOLN
10.0000 mL | Freq: Once | INTRAVENOUS | Status: AC | PRN
  Administered 2018-09-11: 10 mL via INTRAVENOUS

## 2018-09-15 DIAGNOSIS — E11621 Type 2 diabetes mellitus with foot ulcer: Secondary | ICD-10-CM | POA: Diagnosis not present

## 2018-09-19 DIAGNOSIS — E11621 Type 2 diabetes mellitus with foot ulcer: Secondary | ICD-10-CM | POA: Diagnosis not present

## 2018-09-29 ENCOUNTER — Other Ambulatory Visit (HOSPITAL_COMMUNITY)
Admission: RE | Admit: 2018-09-29 | Discharge: 2018-09-29 | Disposition: A | Discharge status: Home / Self Care | Payer: BC Managed Care – PPO | Source: Other Acute Inpatient Hospital | Attending: Internal Medicine | Admitting: Internal Medicine

## 2018-09-29 DIAGNOSIS — E11621 Type 2 diabetes mellitus with foot ulcer: Secondary | ICD-10-CM | POA: Insufficient documentation

## 2018-09-29 DIAGNOSIS — L97512 Non-pressure chronic ulcer of other part of right foot with fat layer exposed: Secondary | ICD-10-CM | POA: Diagnosis present

## 2018-10-02 ENCOUNTER — Other Ambulatory Visit: Payer: Self-pay | Admitting: Family Medicine

## 2018-10-02 DIAGNOSIS — E1140 Type 2 diabetes mellitus with diabetic neuropathy, unspecified: Secondary | ICD-10-CM

## 2018-10-02 DIAGNOSIS — E559 Vitamin D deficiency, unspecified: Secondary | ICD-10-CM

## 2018-10-02 LAB — AEROBIC CULTURE W GRAM STAIN (SUPERFICIAL SPECIMEN)

## 2018-10-02 LAB — AEROBIC CULTURE? (SUPERFICIAL SPECIMEN)

## 2018-10-06 ENCOUNTER — Encounter (HOSPITAL_BASED_OUTPATIENT_CLINIC_OR_DEPARTMENT_OTHER): Payer: BC Managed Care – PPO | Attending: Internal Medicine

## 2018-10-06 ENCOUNTER — Other Ambulatory Visit: Payer: Self-pay

## 2018-10-06 DIAGNOSIS — E1140 Type 2 diabetes mellitus with diabetic neuropathy, unspecified: Secondary | ICD-10-CM | POA: Insufficient documentation

## 2018-10-06 DIAGNOSIS — L03115 Cellulitis of right lower limb: Secondary | ICD-10-CM | POA: Diagnosis not present

## 2018-10-06 DIAGNOSIS — E11621 Type 2 diabetes mellitus with foot ulcer: Secondary | ICD-10-CM | POA: Diagnosis not present

## 2018-10-06 DIAGNOSIS — G4730 Sleep apnea, unspecified: Secondary | ICD-10-CM | POA: Insufficient documentation

## 2018-10-06 DIAGNOSIS — L97512 Non-pressure chronic ulcer of other part of right foot with fat layer exposed: Secondary | ICD-10-CM | POA: Diagnosis not present

## 2018-10-06 DIAGNOSIS — B9689 Other specified bacterial agents as the cause of diseases classified elsewhere: Secondary | ICD-10-CM | POA: Insufficient documentation

## 2018-10-06 DIAGNOSIS — I10 Essential (primary) hypertension: Secondary | ICD-10-CM | POA: Insufficient documentation

## 2018-10-06 DIAGNOSIS — B965 Pseudomonas (aeruginosa) (mallei) (pseudomallei) as the cause of diseases classified elsewhere: Secondary | ICD-10-CM | POA: Insufficient documentation

## 2018-10-12 DIAGNOSIS — E11621 Type 2 diabetes mellitus with foot ulcer: Secondary | ICD-10-CM | POA: Diagnosis not present

## 2018-10-17 ENCOUNTER — Other Ambulatory Visit: Payer: Self-pay | Admitting: Family Medicine

## 2018-10-17 DIAGNOSIS — E1140 Type 2 diabetes mellitus with diabetic neuropathy, unspecified: Secondary | ICD-10-CM

## 2018-10-20 DIAGNOSIS — E11621 Type 2 diabetes mellitus with foot ulcer: Secondary | ICD-10-CM | POA: Diagnosis not present

## 2018-10-27 DIAGNOSIS — E11621 Type 2 diabetes mellitus with foot ulcer: Secondary | ICD-10-CM | POA: Diagnosis not present

## 2018-11-13 ENCOUNTER — Other Ambulatory Visit: Payer: Self-pay | Admitting: Family Medicine

## 2018-11-13 DIAGNOSIS — E1140 Type 2 diabetes mellitus with diabetic neuropathy, unspecified: Secondary | ICD-10-CM

## 2018-11-14 ENCOUNTER — Other Ambulatory Visit: Payer: Self-pay

## 2018-11-14 ENCOUNTER — Encounter: Payer: Self-pay | Admitting: Gastroenterology

## 2018-11-14 ENCOUNTER — Ambulatory Visit (INDEPENDENT_AMBULATORY_CARE_PROVIDER_SITE_OTHER): Payer: BC Managed Care – PPO | Admitting: Family Medicine

## 2018-11-14 ENCOUNTER — Encounter: Payer: Self-pay | Admitting: Family Medicine

## 2018-11-14 VITALS — BP 159/85 | HR 71 | Temp 98.5°F | Ht 72.0 in | Wt 235.4 lb

## 2018-11-14 DIAGNOSIS — Z91148 Patient's other noncompliance with medication regimen for other reason: Secondary | ICD-10-CM | POA: Insufficient documentation

## 2018-11-14 DIAGNOSIS — E1129 Type 2 diabetes mellitus with other diabetic kidney complication: Secondary | ICD-10-CM

## 2018-11-14 DIAGNOSIS — R112 Nausea with vomiting, unspecified: Secondary | ICD-10-CM

## 2018-11-14 DIAGNOSIS — I10 Essential (primary) hypertension: Secondary | ICD-10-CM

## 2018-11-14 DIAGNOSIS — E782 Mixed hyperlipidemia: Secondary | ICD-10-CM

## 2018-11-14 DIAGNOSIS — Z91199 Patient's noncompliance with other medical treatment and regimen due to unspecified reason: Secondary | ICD-10-CM

## 2018-11-14 DIAGNOSIS — E1169 Type 2 diabetes mellitus with other specified complication: Secondary | ICD-10-CM

## 2018-11-14 DIAGNOSIS — R809 Proteinuria, unspecified: Secondary | ICD-10-CM

## 2018-11-14 DIAGNOSIS — R801 Persistent proteinuria, unspecified: Secondary | ICD-10-CM | POA: Insufficient documentation

## 2018-11-14 DIAGNOSIS — R630 Anorexia: Secondary | ICD-10-CM

## 2018-11-14 DIAGNOSIS — Z9114 Patient's other noncompliance with medication regimen: Secondary | ICD-10-CM

## 2018-11-14 DIAGNOSIS — E1159 Type 2 diabetes mellitus with other circulatory complications: Secondary | ICD-10-CM | POA: Diagnosis not present

## 2018-11-14 DIAGNOSIS — E1140 Type 2 diabetes mellitus with diabetic neuropathy, unspecified: Secondary | ICD-10-CM | POA: Diagnosis not present

## 2018-11-14 DIAGNOSIS — Z91119 Patient's noncompliance with dietary regimen due to unspecified reason: Secondary | ICD-10-CM | POA: Insufficient documentation

## 2018-11-14 DIAGNOSIS — I152 Hypertension secondary to endocrine disorders: Secondary | ICD-10-CM

## 2018-11-14 DIAGNOSIS — K449 Diaphragmatic hernia without obstruction or gangrene: Secondary | ICD-10-CM

## 2018-11-14 DIAGNOSIS — E559 Vitamin D deficiency, unspecified: Secondary | ICD-10-CM

## 2018-11-14 DIAGNOSIS — Z9111 Patient's noncompliance with dietary regimen: Secondary | ICD-10-CM

## 2018-11-14 DIAGNOSIS — R7989 Other specified abnormal findings of blood chemistry: Secondary | ICD-10-CM

## 2018-11-14 DIAGNOSIS — Z79899 Other long term (current) drug therapy: Secondary | ICD-10-CM

## 2018-11-14 LAB — POCT GLYCOSYLATED HEMOGLOBIN (HGB A1C): Hemoglobin A1C: 6.9 % — AB (ref 4.0–5.6)

## 2018-11-14 LAB — POCT UA - MICROALBUMIN
Creatinine, POC: 10 mg/dL
Microalbumin Ur, POC: 10 mg/L

## 2018-11-14 MED ORDER — GABAPENTIN 600 MG PO TABS: 600.0000 mg | ORAL_TABLET | Freq: Three times a day (TID) | ORAL | 0 refills | Status: DC

## 2018-11-14 MED ORDER — OLMESARTAN-AMLODIPINE-HCTZ 20-5-12.5 MG PO TABS: 1.0000 | ORAL_TABLET | Freq: Every day | ORAL | 0 refills | Status: DC

## 2018-11-14 NOTE — Patient Instructions (Addendum)
- Please check your fasting blood sugar 4 times or more per week. - Also check your post-prandial blood sugars (2 hours after your biggest meal of the day) 4 times per week, especially after you eat especially healthfully, or especially unhealthfully. - Bring in log of blood pressures and blood sugars to next appointment. -Please make sure your blood pressures are running less than 135/85 on a regular basis.  Because of your multiple medical problems, higher blood pressure than this goal would cause organ damage such as the protein in the urine  -If the thyroid levels are still off we will send you to an endocrinologist in the future to get that evaluated because that would not be usual or normal     Amlodipine; Hydrochlorothiazide; Olmesartan oral tablets What is this medicine? AMLODIPINE; HYDROCHLOROTHIAZIDE, HCTZ; OLMESARTAN (am LOE di peen; hye droe klor oh THYE a zide; all mi SAR tan) is a combination of a calcium channel blocker, a diuretic, and an angiotensin II antagonist. This medicine is used to treat high blood pressure. This medicine may be used for other purposes; ask your health care provider or pharmacist if you have questions. COMMON BRAND NAME(S): Tribenzor What should I tell my health care provider before I take this medicine? They need to know if you have any of these conditions:  decreased urine  diabetes  heart failure, recent heart attack, or other heart problems  if you are on a special diet, like a low salt diet  immune system problems, like lupus  kidney disease  liver disease  vomiting or diarrhea as this may cause dehydration  an unusual or allergic reaction to amlodipine, hydrochlorothiazide, sulfa drugs, olmesartan, other medicines, foods, dyes, or preservatives  pregnant or trying to get pregnant  breast-feeding How should I use this medicine? Take this medicine by mouth with a glass of water. Follow the directions on the prescription label. You  can take it with or without food. If it upsets your stomach, take it with food. Take your medicine at regular intervals. Do not take it more often than directed. Do not stop taking except on your doctor's advice. Talk to your pediatrician regarding the use of this medicine in children. Special care may be needed. Patients over 71 years old may have a stronger reaction and need a smaller dose. Overdosage: If you think you have taken too much of this medicine contact a poison control center or emergency room at once. NOTE: This medicine is only for you. Do not share this medicine with others. What if I miss a dose? If you miss a dose, take it as soon as you can. If it is almost time for your next dose, take only that dose. Do not take double or extra doses. What may interact with this medicine?  alcohol  barbiturates like phenobarbital  diuretics like triamterene, spironolactone, or amiloride  lithium  medicines for blood pressure  medicines for diabetes  norepinephrine  NSAIDS, medicines for pain and inflammation, like ibuprofen or naproxen  potassium salts or potassium supplements  prescription pain medicines  skeletal muscle relaxants like tubocurarine  some cholesterol lowering medicines like cholestyramine or colestipol  steroid medicines like prednisone or cortisone This list may not describe all possible interactions. Give your health care provider a list of all the medicines, herbs, non-prescription drugs, or dietary supplements you use. Also tell them if you smoke, drink alcohol, or use illegal drugs. Some items may interact with your medicine. What should I watch for while  using this medicine? Visit your doctor or health care provider for regular checks on your progress. Check your blood pressure as directed. Ask your doctor or health care provider what your blood pressure should be and when you should contact him or her. You must not get dehydrated. Ask your doctor or  health care provider how much fluid you need to drink a day. Check with him or her if you get an attack of severe diarrhea, nausea and vomiting, or if you sweat a lot. The loss of too much body fluid can make it dangerous for you to take this medicine. Women should inform their doctor if they wish to become pregnant or think they might be pregnant. There is a potential for serious side effects to an unborn child. Talk to your health care provider or pharmacist for more information. You may get drowsy or dizzy. Do not drive, use machinery, or do anything that needs mental alertness until you know how this medicine affects you. Do not stand or sit up quickly, especially if you are an older patient. This reduces the risk of dizzy or fainting spells. Alcohol may interfere with the effect of this medicine. Avoid alcoholic drinks. This medicine may increase blood sugar. Ask your healthcare provider if changes in diet or medicines are needed if you have diabetes. Avoid salt substitutes unless you are told otherwise by your doctor or health care provider. This medicine can make you more sensitive to the sun. Keep out of the sun. If you cannot avoid being in the sun, wear protective clothing and use sunscreen. Do not use sun lamps or tanning beds/booths. Do not treat yourself for coughs, colds, or pain while you are taking this medicine without asking your doctor or health care provider for advice. Some ingredients may increase your blood pressure. If you are going to have surgery or dialysis, tell your doctor or health care provider that you are taking this medicine. What side effects may I notice from receiving this medicine? Side effects that you should report to your doctor or health care professional as soon as possible:  allergic reactions like skin rash, itching or hives, swelling of the face, lips, or tongue  breathing problems  changes in vision  chest pain  confusion  dark  urine  diarrhea  eye pain  fast, irregular heartbeat  feeling faint or lightheaded, falls  low blood pressure  muscle cramps  redness, blistering, peeling or loosening of the skin, including inside the mouth  signs and symptoms of high blood sugar such as being more thirsty or hungry or having to urinate more than normal. You may also feel very tired or have blurry vision.  stomach pain  swelling of the hands, ankles, or feet  trouble passing urine or change in the amount of urine  vomiting  weight loss  worsened gout pain  yellowing of the eyes or skin Side effects that usually do not require medical attention (report to your doctor or health care professional if they continue or are bothersome):  change in sex drive or performance  dizziness  headache  muscle twitching  nausea  stuffy or runny nose and sore throat  swelling of the joints  weakness This list may not describe all possible side effects. Call your doctor for medical advice about side effects. You may report side effects to FDA at 1-800-FDA-1088. Where should I keep my medicine? Keep out of the reach of children. Store at room temperature between 15 and 30 degrees  C (59 and 86 degrees F). Protect from moisture. Throw away any unused medicine after the expiration date. NOTE: This sheet is a summary. It may not cover all possible information. If you have questions about this medicine, talk to your doctor, pharmacist, or health care provider.  2020 Elsevier/Gold Standard (2018-07-10 12:14:02)

## 2018-11-14 NOTE — Progress Notes (Signed)
Impression and Recommendations:    1. Mixed diabetic hyperlipidemia associated with type 2 diabetes mellitus ([name])   2. Hypertension associated with diabetes ([name])   3. Type 2 diabetes mellitus with other specified complication, without long-term current use of insulin ([name])   4. Persistent proteinuria associated with type 2 diabetes mellitus ([name])   5. Diabetic neuropathy, painful ([name])   6. Morbidly obese ([name])   7. Non-intractable vomiting with nausea, unspecified vomiting type   8. h/o Hiatal hernia   9. Anorexia   10. Vitamin D deficiency   11. Low TSH level   12. High risk medications (not anticoagulants) long-term use   13. Noncompliance with diet and medication regimen    -Referral to gastroenterology.  For patient's ongoing nausea and anorexia.  -Bp poorly controlled and pt NON-COMPLIANT with treatment plan:   extensive counseling done on importance of good BP control and consequences of not having it well controlled.   - DC losartan and add on losartan/amlodipine/hydrochlorothiazide.  -We will obtain full set of blood work today due to multiple abnormalities in January and it has been over 6 months.  -If TSH still abnormal we will send him to an endocrinologist again- pt lost to f/up / did not go to referral OV.  -Explained to patient he needs to be more compliant with his medications as well as checking blood sugars and blood pressures.  -Because of the persistent proteinuria, explained patient goal blood pressures less than 130/80 and a regular basis especially due to his young age and med conditions.  -In terms of his bilateral lower extremity edema, encouraged to walk and exercise more; red flags d/c pt- none present.  Adequate hydration and dec salt intake also discussed.   Mixed diabetic hyperlipidemia associated with type 2 diabetes mellitus ([name]) - Plan: Comprehensive metabolic panel, Lipid panel, Direct LDL  Hypertension associated with diabetes ([name]) -  Plan: CBC with Differential/Platelet, Comprehensive metabolic panel, TSH, T4, free, Magnesium  Type 2 diabetes mellitus with other specified complication, without long-term current use of insulin ([name]) - Plan: POCT UA - Microalbumin, POCT glycosylated hemoglobin (Hb A1C), CBC with Differential/Platelet, Comprehensive metabolic panel, Magnesium  Persistent proteinuria associated with type 2 diabetes mellitus ([name])  Diabetic neuropathy, painful ([name]) - Plan: Vitamin B12, Comprehensive metabolic panel, gabapentin (NEURONTIN) 600 MG tablet  Morbidly obese ([name])  Non-intractable vomiting with nausea, unspecified vomiting type - Plan: Ambulatory referral to Gastroenterology  h/o Hiatal hernia - Plan: Ambulatory referral to Gastroenterology  Anorexia - Plan: Ambulatory referral to Gastroenterology  Vitamin D deficiency - Plan: VITAMIN D 25 Hydroxy (Vit-D Deficiency, Fractures)  Low TSH level - Plan: TSH, T4, free  High risk medications (not anticoagulants) long-term use - Plan: Vitamin B12, CBC with Differential/Platelet, Comprehensive metabolic panel, VITAMIN D 25 Hydroxy (Vit-D Deficiency, Fractures), TSH, T4, free, Magnesium  Noncompliance with diet and medication regimen    Education and routine counseling performed. Handouts provided.  Was in the office discussing all of patient's medical conditions with him for over 50 minutes today.  Explained to patient I cannot continue to do this and that he only has a 15-minute follow-up visits.  Explained that this puts everybody else behind.  Explained to patient he needs to come in more frequently to address his multiple issues that are going on/multiple questions he has..  Pt was interviewed and evaluated by me in the clinic today for 32.5+ minutes, with over 50% time spent in face to face counseling of patients various  medical conditions, treatment plans of those medical conditions including medicine management and lifestyle modification,  strategies to improve health and well being; and in coordination of care. SEE ABOVE TREATMENT PLAN FOR DETAILS  Orders Placed This Encounter  Procedures  . Vitamin B12  . CBC with Differential/Platelet  . Comprehensive metabolic panel  . Lipid panel  . VITAMIN D 25 Hydroxy (Vit-D Deficiency, Fractures)  . TSH  . T4, free  . Magnesium  . Direct LDL  . Ambulatory referral to Gastroenterology  . POCT UA - Microalbumin  . POCT glycosylated hemoglobin (Hb A1C)    Medications Discontinued During This Encounter  Medication Reason  . losartan (COZAAR) 50 MG tablet   . gabapentin (NEURONTIN) 600 MG tablet Reorder      Meds ordered this encounter  Medications  . Olmesartan-amLODIPine-HCTZ 20-5-12.5 MG TABS    Sig: Take 1 tablet by mouth daily.    Dispense:  90 tablet    Refill:  0  . gabapentin (NEURONTIN) 600 MG tablet    Sig: Take 1 tablet (600 mg total) by mouth 3 (three) times daily. Needs office visit for further refills    Dispense:  270 tablet    Refill:  0    The patient was counseled, risk factors were discussed, anticipatory guidance given.  Gross side effects, risk and benefits, and alternatives of medications discussed with patient.  Patient is aware that all medications have potential side effects and we are unable to predict every side effect or drug-drug interaction that may occur.  Expresses verbal understanding and consents to current therapy plan and treatment regimen.   Return for f/up 62mo - MMP; if multiple issues besides chronic care issues- make multiple appts plz.   Please see AVS handed out to patient at the end of our visit for further patient instructions/ counseling done pertaining to today's office visit.    Note:  This document was prepared using Dragon voice recognition software and may include unintentional dictation errors.  [name] 11/14/2018 11:00 AM        Subjective:    Chief Complaint  Patient presents with  . Follow-up       [name] is a 53 y.o. male who presents to [facility] today for Diabetes Management.    -Back in January patient was told to follow-up 3 months and he was lost to follow-up.  Also at that time we referred him to endocrinology for his low TSH with normal T4 T3.  Patient did not follow-up. -Patient is angry today and states he can only see one doctor at a time. -He also tells me he needs to go on disability for his chronic back pain and neuropathy in his lower extremities -Patient has not been compliant with treatment regiment.  He is not taking his metformin secondary to his GI issues which he says been going on for years and years although, he has never come about this in the past -He also is angry about medications and is wondering about all the side effects of them. -I reviewed his labs again today from January 2020 and patient tells me I never told him any of that stuff. -We reviewed the fact that he has proteinuria due to his diabetes as well as his blood pressure poorly controlled.  Last office visit he was told to:  - Please check your fasting blood sugar 4 times per week. - Also check your post-prandial blood sugars (2 hours after  your biggest meal of the day) 4 times per week, especially after you eat especially healthfully, or especially unhealthfully. - Bring in log of blood pressures and blood sugars to next appointment. -He did not have these.  -neuropathy B/L LExt  along with back sx- caused it in the first place.   2010- had back sx-  and caused  -Also patient has complaints of right upper back pain near his upper trap.  Patient states is been going on for about a week or so.  Does not remember an ensuing event that caused the pain.  Feels very tight and like a shooting pain shoots down his arm.  No loss of function etc.  Not eating much- throwing up 3-4 times per week b/c of "hiatal hernia".   Going on for yrs and yrs. 7-10 yrs.  Pt started  having stomach problems long ago- and has gotten worse and worse with time- ABout 1 yr ago or so - it has been bad.  Food feels like it getting stuck.   DM HPI:  -  He has not been working on diet and exercise for diabetes. Not eating much- throwing up 3-4 times per week b/c of his "hiatal hernia-"   per patient this is been going on years and years.  Pt is currently maintained on the following medications for diabetes: Nothing   medication compliance -  Not taking metformin at all due to his GI problems  Home glucose readings range:  FBS- not checking;   2 hr PP-  Not checking   Denies polyuria/polydipsia.  Denies hypo/ hyperglycemia symptoms  - He denies new onset of: chest pain, exercise intolerance, shortness of breath, dizziness, visual changes, headache, lower extremity swelling or claudication.   Last diabetic eye exam was No results found for: HMDIABEYEEXA-patient has been noncompliant with his treatment regiment.  Foot exam- UTD  Last A1C in the office was:  Lab Results  Component Value Date   HGBA1C 6.9 (A) 11/14/2018   HGBA1C 6.7 (H) 05/18/2018   HGBA1C 6.1 (A) 11/16/2017    Lab Results  Component Value Date   MICROALBUR 10 11/14/2018   LDLCALC 47 11/30/2018   CREATININE 1.16 11/30/2018     CHOL HPI:   -  He  is currently managed with:  See med list from today  Txmnt compliance-patient states he is taking his medicine at night.    -Triglycerides were still way too elevated and HDL was too low last office visit 6 months ago  Patient reports very little compliance with low chol/ saturated and trans fat diet.  No exercise  No other s-e  Last lipid panel as follows:  Lab Results  Component Value Date   CHOL 86 (L) 11/30/2018   HDL 29 (L) 11/30/2018   LDLCALC 47 11/30/2018   LDLDIRECT 46 11/30/2018   TRIG 49 11/30/2018   CHOLHDL 3.0 11/30/2018    Hepatic Function Latest Ref Rng & Units 11/30/2018 05/18/2018 07/28/2017  Total Protein 6.0 - 8.5 g/dL 7.9  7.4 7.8  Albumin 4.0 - 5.0 g/dL 0.4(V) 3.8 3.8  AST 0 - 40 IU/L 26 20 26   ALT 0 - 44 IU/L 19 21 24   Alk Phosphatase 39 - 117 IU/L 159(H) 128(H) 114  Total Bilirubin 0.0 - 1.2 mg/dL 0.7 0.6 0.7  Bilirubin, Direct 0.0 - 0.3 mg/dL - - -     1. HTN HPI:  -  His blood pressure has not been controlled at home.  Pt is checking  it at home.  130-140/ 80's.   Checks once or twice wkly.   - Patient reports good compliance with blood pressure medications  - Denies medication S-E   - Smoking Status noted   - He denies new onset of: chest pain, exercise intolerance, shortness of breath, dizziness, visual changes, headache, lower extremity swelling or claudication.   Today their BP is BP: (!) 159/85    Last 3 blood pressure readings in our office are as follows: BP Readings from Last 3 Encounters:  11/14/18 (!) 159/85  07/11/18 136/76  05/24/18 (!) 162/80    BMI Readings from Last 3 Encounters:  11/14/18 31.93 kg/m  07/11/18 31.10 kg/m  05/24/18 31.84 kg/m    Patient Care Team    Relationship Specialty Notifications Start End  Jeffrey Lot, DO PCP - General Family Medicine  07/28/17   Arnette Felts, FNP  General Practice  07/29/17      Patient Active Problem List   Diagnosis Date Noted  . Mixed diabetic hyperlipidemia associated with type 2 diabetes mellitus ([name]) 11/16/2017    Priority: High  . Hypertension associated with diabetes ([name]) 07/28/2017    Priority: High  . Diabetes mellitus ([name]) 07/28/2017    Priority: High  . Diabetic neuropathy, painful ([name]) 06/22/2012    Priority: High  . Diabetic peripheral neuropathy associated with type 2 diabetes mellitus ([name]) 11/16/2017    Priority: Medium  . Morbidly obese ([name]) 07/28/2017    Priority: Medium  . Vitamin D deficiency 07/28/2017    Priority: Low  . History of noncompliance with medical treatment 07/28/2017    Priority: Low  . Persistent proteinuria 11/14/2018  . Low TSH level 11/14/2018  . High risk  medications (not anticoagulants) long-term use 11/14/2018  . Noncompliance with diet and medication regimen 11/14/2018  . Hiatal hernia 11/14/2018  . Anorexia 11/14/2018  . Diabetic ulcer of toe of right foot associated with diabetes mellitus due to underlying condition, with fat layer exposed ([name]) 07/11/2018  . Healthcare maintenance 07/11/2018  . Essential hypertension, benign 06/22/2012  . Pancreatitis, acute 06/22/2012  . Nausea with vomiting 06/22/2012  . Abdominal pain, epigastric 06/22/2012  . Acute hyperkalemia 06/22/2012  . Leukocytosis, unspecified 06/22/2012     Past Medical History:  Diagnosis Date  . Diabetes mellitus without complication ([name])   . Hypertension      Past Surgical History:  Procedure Laterality Date  . APPENDECTOMY    . BACK SURGERY       Family History  Problem Relation Age of Onset  . Cancer Mother   . Diabetes Mother   . Diabetes Father      Social History   Substance and Sexual Activity  Drug Use No  ,  Social History   Substance and Sexual Activity  Alcohol Use Yes   Comment: 3 drinks a month  ,  Social History   Tobacco Use  Smoking Status Former Smoker  . Quit date: 06/23/1995  . Years since quitting: 23.5  Smokeless Tobacco Current User  . Types: Chew  ,    Current Outpatient Medications on File Prior to Visit  Medication Sig Dispense Refill  . atorvastatin (LIPITOR) 20 MG tablet Take 1 tablet (20 mg total) by mouth at bedtime. 90 tablet 0  . blood glucose meter kit and supplies Dispense based on patient and insurance preference. Use to test glucose fasting in the morning and then 2 hours after largest meal. (FOR ICD-10 E10.9, E11.9). 1 each 0  . Vitamin D,  Ergocalciferol, (DRISDOL) 1.25 MG (50000 UT) CAPS capsule TAKE 1 CAPSULE BY MOUTH TWO TIMES A WEEK.  [facility] AND SUNDAY 10 capsule 0  . metFORMIN (GLUCOPHAGE) 500 MG tablet Take 1 tablet (500 mg total) by mouth 2 (two) times daily with a meal. (Patient not  taking: Reported on 11/14/2018) 360 tablet 0   No current facility-administered medications on file prior to visit.      No Known Allergies   Review of Systems:   General:  Denies fever, chills Optho/Auditory:   Denies visual changes, blurred vision Respiratory:   Denies SOB, cough, wheeze, DIB  Cardiovascular:   Denies chest pain, palpitations, painful respirations Gastrointestinal:   Denies nausea, vomiting, diarrhea.  Endocrine:     Denies new hot or cold intolerance Musculoskeletal:  Denies joint swelling, gait issues, or new unexplained myalgias/ arthralgias Skin:  Denies rash, suspicious lesions  Neurological:    Denies dizziness, unexplained weakness, numbness  Psychiatric/Behavioral:   Denies mood changes    Objective:     Blood pressure (!) 159/85, pulse 71, temperature 98.5 F (36.9 C), height 6' (1.829 m), weight 235 lb 6.4 oz (106.8 kg), SpO2 100 %.  Body mass index is 31.93 kg/m.  General: Well Developed, well nourished, and in no acute distress.  HEENT: Normocephalic, atraumatic, pupils equal round reactive to light, neck supple, No carotid bruits, no JVD Skin: Warm and dry, cap RF less 2 sec Cardiac: Regular rate and rhythm, S1, S2 WNL's, no murmurs rubs or gallops Respiratory: ECTA B/L, Not using accessory muscles, speaking in full sentences. NeuroM-Sk: Ambulates w/o assistance, moves ext * 4 w/o difficulty, sensation grossly intact.  Ext: scant edema b/l lower ext Psych: No HI/SI, judgement and insight good, Euthymic mood. Full Affect.

## 2018-11-30 ENCOUNTER — Other Ambulatory Visit (INDEPENDENT_AMBULATORY_CARE_PROVIDER_SITE_OTHER): Payer: BC Managed Care – PPO

## 2018-11-30 ENCOUNTER — Other Ambulatory Visit: Payer: Self-pay

## 2018-11-30 DIAGNOSIS — E559 Vitamin D deficiency, unspecified: Secondary | ICD-10-CM

## 2018-11-30 DIAGNOSIS — E1169 Type 2 diabetes mellitus with other specified complication: Secondary | ICD-10-CM

## 2018-11-30 DIAGNOSIS — I10 Essential (primary) hypertension: Secondary | ICD-10-CM

## 2018-11-30 DIAGNOSIS — Z79899 Other long term (current) drug therapy: Secondary | ICD-10-CM

## 2018-11-30 DIAGNOSIS — E782 Mixed hyperlipidemia: Secondary | ICD-10-CM

## 2018-11-30 DIAGNOSIS — R630 Anorexia: Secondary | ICD-10-CM

## 2018-11-30 DIAGNOSIS — E08621 Diabetes mellitus due to underlying condition with foot ulcer: Secondary | ICD-10-CM

## 2018-11-30 DIAGNOSIS — L97512 Non-pressure chronic ulcer of other part of right foot with fat layer exposed: Secondary | ICD-10-CM

## 2018-11-30 DIAGNOSIS — E1159 Type 2 diabetes mellitus with other circulatory complications: Secondary | ICD-10-CM

## 2018-11-30 DIAGNOSIS — R7989 Other specified abnormal findings of blood chemistry: Secondary | ICD-10-CM

## 2018-11-30 DIAGNOSIS — I152 Hypertension secondary to endocrine disorders: Secondary | ICD-10-CM

## 2018-11-30 DIAGNOSIS — Z0000 Encounter for general adult medical examination without abnormal findings: Secondary | ICD-10-CM

## 2018-12-01 ENCOUNTER — Other Ambulatory Visit: Payer: BC Managed Care – PPO

## 2018-12-01 LAB — LIPID PANEL
Chol/HDL Ratio: 3 ratio (ref 0.0–5.0)
Cholesterol, Total: 86 mg/dL — ABNORMAL LOW (ref 100–199)
HDL: 29 mg/dL — ABNORMAL LOW (ref >39)
LDL Calculated: 47 mg/dL (ref 0–99)
Triglycerides: 49 mg/dL (ref 0–149)
VLDL Cholesterol Cal: 10 mg/dL (ref 5–40)

## 2018-12-01 LAB — COMPREHENSIVE METABOLIC PANEL
ALT: 19 IU/L (ref 0–44)
AST: 26 IU/L (ref 0–40)
Albumin/Globulin Ratio: 0.9 — ABNORMAL LOW (ref 1.2–2.2)
Albumin: 3.7 g/dL — ABNORMAL LOW (ref 4.0–5.0)
Alkaline Phosphatase: 159 IU/L — ABNORMAL HIGH (ref 39–117)
BUN/Creatinine Ratio: 8 — ABNORMAL LOW (ref 9–20)
BUN: 9 mg/dL (ref 6–24)
Bilirubin Total: 0.7 mg/dL (ref 0.0–1.2)
CO2: 28 mmol/L (ref 20–29)
Calcium: 9.7 mg/dL (ref 8.7–10.2)
Chloride: 92 mmol/L — ABNORMAL LOW (ref 96–106)
Creatinine, Ser: 1.16 mg/dL (ref 0.76–1.27)
GFR calc Af Amer: 84 mL/min/{1.73_m2} (ref >59)
GFR calc non Af Amer: 73 mL/min/{1.73_m2} (ref >59)
Globulin, Total: 4.2 g/dL (ref 1.5–4.5)
Glucose: 199 mg/dL — ABNORMAL HIGH (ref 65–99)
Potassium: 3.9 mmol/L (ref 3.5–5.2)
Sodium: 135 mmol/L (ref 134–144)
Total Protein: 7.9 g/dL (ref 6.0–8.5)

## 2018-12-01 LAB — CBC WITH DIFFERENTIAL/PLATELET
Basophils Absolute: 0.1 10*3/uL (ref 0.0–0.2)
Basos: 1 %
EOS (ABSOLUTE): 0.5 10*3/uL — ABNORMAL HIGH (ref 0.0–0.4)
Eos: 4 %
Hematocrit: 36.2 % — ABNORMAL LOW (ref 37.5–51.0)
Hemoglobin: 13.4 g/dL (ref 13.0–17.7)
Immature Grans (Abs): 0.1 10*3/uL (ref 0.0–0.1)
Immature Granulocytes: 1 %
Lymphocytes Absolute: 1.4 10*3/uL (ref 0.7–3.1)
Lymphs: 14 %
MCH: 33.1 pg — ABNORMAL HIGH (ref 26.6–33.0)
MCHC: 37 g/dL — ABNORMAL HIGH (ref 31.5–35.7)
MCV: 89 fL (ref 79–97)
Monocytes Absolute: 0.8 10*3/uL (ref 0.1–0.9)
Monocytes: 7 %
Neutrophils Absolute: 7.5 10*3/uL — ABNORMAL HIGH (ref 1.4–7.0)
Neutrophils: 73 %
Platelets: 115 10*3/uL — ABNORMAL LOW (ref 150–450)
RBC: 4.05 x10E6/uL — ABNORMAL LOW (ref 4.14–5.80)
RDW: 12.1 % (ref 11.6–15.4)
WBC: 10.2 10*3/uL (ref 3.4–10.8)

## 2018-12-01 LAB — T4, FREE: Free T4: 0.98 ng/dL (ref 0.82–1.77)

## 2018-12-01 LAB — LDL CHOLESTEROL, DIRECT: LDL Direct: 46 mg/dL (ref 0–99)

## 2018-12-01 LAB — TSH: TSH: 0.731 u[IU]/mL (ref 0.450–4.500)

## 2018-12-01 LAB — VITAMIN B12: Vitamin B-12: 457 pg/mL (ref 232–1245)

## 2018-12-01 LAB — MAGNESIUM: Magnesium: 1.8 mg/dL (ref 1.6–2.3)

## 2018-12-01 LAB — VITAMIN D 25 HYDROXY (VIT D DEFICIENCY, FRACTURES): Vit D, 25-Hydroxy: 93.4 ng/mL (ref 30.0–100.0)

## 2018-12-07 ENCOUNTER — Other Ambulatory Visit: Payer: Self-pay | Admitting: Family Medicine

## 2018-12-07 DIAGNOSIS — E538 Deficiency of other specified B group vitamins: Secondary | ICD-10-CM

## 2018-12-07 LAB — IRON AND TIBC
Iron Saturation: 21 % (ref 15–55)
Iron: 57 ug/dL (ref 38–169)
Total Iron Binding Capacity: 270 ug/dL (ref 250–450)
UIBC: 213 ug/dL (ref 111–343)

## 2018-12-07 LAB — TRANSFERRIN: Transferrin: 230 mg/dL (ref 177–329)

## 2018-12-07 LAB — FOLATE: Folate: 2.3 ng/mL — ABNORMAL LOW (ref >3.0)

## 2018-12-07 LAB — SPECIMEN STATUS REPORT

## 2018-12-07 LAB — FERRITIN: Ferritin: 175 ng/mL (ref 30–400)

## 2018-12-07 MED ORDER — FOLIC ACID 1 MG PO TABS: 1.0000 mg | ORAL_TABLET | Freq: Every day | ORAL | 1 refills | Status: DC

## 2018-12-14 ENCOUNTER — Ambulatory Visit: Payer: BC Managed Care – PPO | Admitting: Gastroenterology

## 2019-01-15 ENCOUNTER — Encounter: Payer: Self-pay | Admitting: Family Medicine

## 2019-01-15 ENCOUNTER — Other Ambulatory Visit: Payer: Self-pay

## 2019-01-15 ENCOUNTER — Ambulatory Visit (INDEPENDENT_AMBULATORY_CARE_PROVIDER_SITE_OTHER): Payer: BC Managed Care – PPO | Admitting: Family Medicine

## 2019-01-15 VITALS — BP 126/74 | HR 74 | Temp 98.2°F | Ht 71.0 in | Wt 231.0 lb

## 2019-01-15 DIAGNOSIS — Z23 Encounter for immunization: Secondary | ICD-10-CM | POA: Diagnosis not present

## 2019-01-15 DIAGNOSIS — Z79899 Other long term (current) drug therapy: Secondary | ICD-10-CM

## 2019-01-15 DIAGNOSIS — E1140 Type 2 diabetes mellitus with diabetic neuropathy, unspecified: Secondary | ICD-10-CM

## 2019-01-15 DIAGNOSIS — Z91119 Patient's noncompliance with dietary regimen due to unspecified reason: Secondary | ICD-10-CM

## 2019-01-15 DIAGNOSIS — I152 Hypertension secondary to endocrine disorders: Secondary | ICD-10-CM

## 2019-01-15 DIAGNOSIS — M62838 Other muscle spasm: Secondary | ICD-10-CM

## 2019-01-15 DIAGNOSIS — E1159 Type 2 diabetes mellitus with other circulatory complications: Secondary | ICD-10-CM

## 2019-01-15 DIAGNOSIS — I10 Essential (primary) hypertension: Secondary | ICD-10-CM

## 2019-01-15 DIAGNOSIS — E782 Mixed hyperlipidemia: Secondary | ICD-10-CM

## 2019-01-15 DIAGNOSIS — E559 Vitamin D deficiency, unspecified: Secondary | ICD-10-CM | POA: Diagnosis not present

## 2019-01-15 DIAGNOSIS — M549 Dorsalgia, unspecified: Secondary | ICD-10-CM

## 2019-01-15 DIAGNOSIS — E538 Deficiency of other specified B group vitamins: Secondary | ICD-10-CM

## 2019-01-15 DIAGNOSIS — Z9114 Patient's other noncompliance with medication regimen: Secondary | ICD-10-CM

## 2019-01-15 DIAGNOSIS — Z9111 Patient's noncompliance with dietary regimen: Secondary | ICD-10-CM

## 2019-01-15 DIAGNOSIS — E1169 Type 2 diabetes mellitus with other specified complication: Secondary | ICD-10-CM | POA: Diagnosis not present

## 2019-01-15 DIAGNOSIS — E1129 Type 2 diabetes mellitus with other diabetic kidney complication: Secondary | ICD-10-CM

## 2019-01-15 DIAGNOSIS — R809 Proteinuria, unspecified: Secondary | ICD-10-CM

## 2019-01-15 MED ORDER — METFORMIN HCL 500 MG PO TABS: 500.0000 mg | ORAL_TABLET | Freq: Two times a day (BID) | ORAL | 2 refills | Status: DC

## 2019-01-15 MED ORDER — CYCLOBENZAPRINE HCL 10 MG PO TABS: 10.0000 mg | ORAL_TABLET | Freq: Three times a day (TID) | ORAL | 0 refills | Status: DC | PRN

## 2019-01-15 NOTE — Progress Notes (Signed)
Impression and Recommendations:    1. Type 2 diabetes mellitus with other specified complication, without long-term current use of insulin ([place])   2. Hypertension associated with diabetes ([place])   3. Mixed diabetic hyperlipidemia associated with type 2 diabetes mellitus ([place])   4. Vitamin D deficiency   5. High risk medications (not anticoagulants) long-term use   6. Noncompliance with diet and medication regimen   7. Morbidly obese ([place])   8. Folic acid deficiency   9. Muscle spasm   10. Upper back pain on left side   11. Persistent proteinuria associated with type 2 diabetes mellitus ([place])   12. Diabetic neuropathy, painful [facility])     Missed Referral to Gastroenterology - Not Eating / per pt "Unable to Eat" - Per patient, missed recent referral to gastroenterology. - Advised patient to call gastroenterology for follow-up. - If he cannot reschedule his appointment on his own, advised patient to call in to clinic for further assistance.   Upper Back Pain, Left Shoulder Pain - Pt with historical sports injury in the left shoulder without proper follow-up or eval/treatment. -Patient with chronic clicking and dislocation of shoulder which he showed me on exam today. - Reviewed potential causes of patient's current symptoms, primarily including muscle spasms beyond chronic arthritic changes etc. - Extensive education provided and all questions were answered.  - Prescription Flexeril provided today.  We discussed the risk and benefits of this medicine.  He understands this can make his dizziness or off balance and this worse. - Discussed prudent use with patient along with risks and benefits.  - Referral to PT provided today.  - STRONGLY advised patient to obtain follow-up with physical therapy. - Discussed PT application of procedures such as dry needling to help relax muscles. - Reviewed importance of prudent exercises as recommended by PT. - Discussed use of PT for both acute  relief and future preventative measures.  - Will continue to monitor.   Type 2 Diabetes Mellitus - Per patient, states he does not take his metformin if he does not eat. - Discussed prudent medication compliance with patient today. - Extensive education provided today regarding need to control blood sugar. - Discussed possible sx of suboptimally controlled blood sugar, such as numbness, coldness in the extremities, gastroparesis and extremity swelling.   - Discussed increasing control of his peripheral vascular disease and peripheral neuropathy through controlling pt's blood sugar.  - A1c 6.9 last check November 14, 2018. - Blood sugar remains suboptimally controlled at this time.  Explained to patient because of his neuropathy as well as poor circulation, I recommend tighter control of his blood sugars and A1c. -Recommended patient cannot eat due to his previous GI chronic problems-then at least take one half tab of the metformin twice daily instead of none at all.   See med list below.  - Counseled patient on pathophysiology of disease and discussed various treatment options, which often includes dietary and lifestyle modifications as first line.  Importance of low carb diet discussed with patient in addition to regular exercise.   - Check FBS and 2 hours after the biggest meal of your day.  Keep log and bring in next OV for my review.   Also, if you ever feel poorly, please check your blood pressure and blood sugar, as one or the other could be the cause of your symptoms.  - Being a diabetic, you need yearly eye and foot exams. Make appt.for diabetic eye exam.  - Will  continue to monitor.   Peripheral Neuropathy associated with DM - Continues on gabapentin for management. - Per patient, tolerating meds well without S-E. - Continue as prescribed. - Will continue to monitor.   Diabetic Need for Podiatric Care - Extensive education provided to patient today.  All questions answered. -  Discussed that given patient's diabetes diagnosis and recent diabetic ulcer, patient should be followed by podiatry. -Patient can address with him any foot concerns/pain he has in his right foot with subjective prominence he feels. - Ambulatory referral to podiatry provided today.   Hypertension associated with DM -  stable at this time. - Continue treatment plan as prescribed.  See med list.  - Ongoing ambulatory BP monitoring encouraged.  - Will continue to monitor.   Mixed Diabetic Hyperlipidemia associated with DM - Per patient, continues statin management as prescribed. - Patient tolerating meds well and denies S-E.  - Ongoing prudent habits and lifestyle changes such as low saturated & trans fat and low carb diets discussed with patient.  Encouraged regular exercise and weight loss when appropriate.   - Will continue to monitor and re-check as recommended.   Folic Acid Deficiency - Per pt, has been taking 1 mg by mouth daily as prescribed-started last office visit. - Need for re-check today. - Per pt, has been constipated since taking supplementation. - Encouraged adequate hydration and adequate food consumption to improve bowel habits.  - Discussed prudent use of stool softeners with patient today.  - Will continue to monitor.   Vitamin D Deficiency - Continue supplementation as prescribed.  Refill provided today per patient request. - Will continue to monitor and re-check as recommended.   BMI Counseling - Body mass index is 32.22 kg/m Explained to patient what BMI refers to, and what it means medically.    Told patient to think about it as a "medical risk stratification measurement" and how increasing BMI is associated with increasing risk/ or worsening state of various diseases such as hypertension, hyperlipidemia, diabetes, premature OA, depression etc.  American Heart Association guidelines for healthy diet, basically Mediterranean diet, and exercise guidelines  of 30 minutes 5 days per week or more discussed in detail.  Health counseling performed.  All questions answered.   Lifestyle & Preventative Health Maintenance - Advised patient to continue working toward exercising to improve overall mental, physical, and emotional health.    - Reviewed the "spokes of the wheel" of mood and health management.  Stressed the importance of ongoing prudent habits, including regular exercise, appropriate sleep hygiene, healthful dietary habits, and prayer/meditation to relax.  - Encouraged patient to engage in daily physical activity, especially a formal exercise routine.  Recommended that the patient eventually strive for at least 150 minutes of moderate cardiovascular activity per week according to guidelines established by the [facility].   - Healthy dietary habits encouraged, including low-carb, and high amounts of lean protein in diet.   - Patient should also consume adequate amounts of water.  Recommendations - Need for Folate and CBC checked today. - Patient knows to return in near future for chronic care and acute concerns PRN. - Discussed critical need for patient to schedule as many appointments as needed.  - Discussed policies and practices here at the clinic, and answered all questions about care team and health management PRN.  - Discussed need for patient to continue to obtain management and screenings with all established specialists.  Educated patient at length about the critical importance of keeping health maintenance up  to date.  - Participated in lengthy conversation and all questions were answered.   Education and routine counseling performed. Handouts provided.   Orders Placed This Encounter  Procedures  . Flu Vaccine QUAD 36+ mos IM (Fluarix & Fluzone Quad PF  . CBC with Differential/Platelet  . Folate  . RBC Folate  . Ambulatory referral to Physical Therapy  . Ambulatory referral to [facility] ordered this encounter   Medications  . cyclobenzaprine (FLEXERIL) 10 MG tablet    Sig: Take 1 tablet (10 mg total) by mouth 3 (three) times daily as needed for muscle spasms.    Dispense:  30 tablet    Refill:  0  . metFORMIN (GLUCOPHAGE) 500 MG tablet    Sig: Take 1 tablet (500 mg total) by mouth 2 (two) times daily with a meal. & only 1/2 tab BID on days you don't eat much    Dispense:  60 tablet    Refill:  2    Needs office visit for further refills    Meds ordered this encounter  Medications  . cyclobenzaprine (FLEXERIL) 10 MG tablet    Sig: Take 1 tablet (10 mg total) by mouth 3 (three) times daily as needed for muscle spasms.    Dispense:  30 tablet    Refill:  0  . metFORMIN (GLUCOPHAGE) 500 MG tablet    Sig: Take 1 tablet (500 mg total) by mouth 2 (two) times daily with a meal. & only 1/2 tab BID on days you don't eat much    Dispense:  60 tablet    Refill:  2    Needs office visit for further refills    The patient was counseled, risk factors were discussed, anticipatory guidance given.  Gross side effects, risk and benefits, and alternatives of medications discussed with patient.  Patient is aware that all medications have potential side effects and we are unable to predict every side effect or drug-drug interaction that may occur.  Expresses verbal understanding and consents to current therapy plan and treatment regimen.   Return in about 4 weeks (around 02/12/2019) for additional issues- discuss referral for disability eval.   Please see AVS handed out to patient at the end of our visit for further patient instructions/ counseling done pertaining to today's office visit.    Note:  This document was prepared using Dragon voice recognition software and may include unintentional dictation errors.   This document serves as a record of services personally performed by Mellody Dance, DO. It was created on her behalf by [name], a trained medical scribe. The creation of this record  is based on the scribe's personal observations and the provider's statements to them.   I have reviewed the above medical documentation for accuracy and completeness and I concur.  Mellody Dance, DO 01/15/2019 1:10 PM        Subjective:    Chief Complaint  Patient presents with  . 2 month follow up    a1c 58month- 6.9     Jeffrey LUKACSis a 53y.o. male [facility]    - Eating Less Notes missed his gastroenterology appointment and isn't sure if he needs a new referral.  - Folic Acid Deficiency Since starting Folic Acid supplementation, has been more constipated. Notes constipation does improve if he drinks more water.  - R Foot Swelling and Pain, Numbness in Both Feet, Cold Extremities  Notes been experiencing foot swelling and pain.  Says he has terrible balance and numbness in both feet, which he thinks may also be related to the foot swelling.  Notes still experiencing coldness in his extremities even after folic acid supplementation.  - Physically Cold Notes his "temperature is lower than everyone else."  Says he has to take a coat to a restaurant and "even at my house I have to put the heat on or the air off, and everybody else is hot while I'm freezing."  Says he generally feels cold and thought this was due to his anemia.  - Upper Back / Left Shoulder Pain Has pain in his back and shoulder and notes "heat doesn't work."  Says he thinks he injured himself while throwing his fishing rod.  Notes that he "can't find a position where it doesn't bother him."  Has been having pain for 3-4 months.  Thinks that this began after fishing an entire weekend.  He is left-handed and notes he fishes often.  Notes sleeps in the chair a lot as well, and thinks this may have aggravated it.  Pain radiates down his left arm, "feels like it's being pulled, like a straining."  Notes he slammed his left shoulder into  second base when he was a kid, and "it pops in and out now."  Notes he has instability in that joint now.  He did follow up with orthopedics as a child, but has not followed up recently.  Says it hasn't bothered him in any way for years.  Has used muscle relaxers in the past.  Denies using them often.  DM HPI: -  He has not been working on diet and exercise for diabetes.  States "just now able to walk again after having a hole in my foot."  Says he has been trying to work on the machines at [employer] since they opened recently.  Says he has not been exercising since his recent diabetic ulcer.  Pt is currently maintained on the following medications for diabetes:  See med list today Medication compliance - Noncompliant.  Says if he doesn't eat, he doesn't take his pills.  Patient becomes angry during appointment and says he stopped taking the pills as prescribed because he his sugar was bottoming out.  States his sugar was bottoming out "because there was nothing for the pills to work on because there was nothing in my system."  Repeats that he needs to go to gastroenterology to figure out why he can't eat.  Home glucose readings range: fasting sugars of 120-140.  Feels his lowest fasting sugar is 120, and highest fasting sugar is 140.  During the day, denies lows.   Denies polyuria/polydipsia. Denies hypo/ hyperglycemia symptoms - He denies new onset of: chest pain, exercise intolerance, shortness of breath, dizziness, visual changes, headache, lower extremity swelling or claudication.   Experiences dizziness occasionally while standing up too quickly.  Does not feel that the gabapentin increases his dizziness.  Last diabetic eye exam was No results found for: HMDIABEYEEXA  Foot exam- UTD  Last A1C in the office was:  Lab Results  Component Value Date   HGBA1C 6.9 (A) 11/14/2018   HGBA1C 6.7 (H) 05/18/2018   HGBA1C 6.1 (A) 11/16/2017    Lab Results  Component Value Date   MICROALBUR  10 11/14/2018   LDLCALC 47 11/30/2018   CREATININE 1.16 11/30/2018    1. HTN HPI:  -  His blood pressure has been controlled at  home.  Pt it checking it at home.   - Patient reports good compliance with blood pressure medications  - Denies medication S-E   - Smoking Status noted   - He denies new onset of: chest pain, exercise intolerance, shortness of breath, dizziness, visual changes, headache, lower extremity swelling or claudication.   Last 3 blood pressure readings in our office are as follows: BP Readings from Last 3 Encounters:  01/15/19 126/74  11/14/18 (!) 159/85  07/11/18 136/76   2. 53 y.o. male here for cholesterol follow-up.   - Patient reports good compliance with medications or treatment plan  - Denies medication S-E   - Smoking Status noted   - He denies new onset of: chest pain, exercise intolerance, shortness of breath, dizziness, visual changes, headache, lower extremity swelling or claudication.   Denies myalgias  The cholesterol last visit was:  Lab Results  Component Value Date   CHOL 86 (L) 11/30/2018   HDL 29 (L) 11/30/2018   LDLCALC 47 11/30/2018   LDLDIRECT 46 11/30/2018   TRIG 49 11/30/2018   CHOLHDL 3.0 11/30/2018    Hepatic Function Latest Ref Rng & Units 11/30/2018 05/18/2018 07/28/2017  Total Protein 6.0 - 8.5 g/dL 7.9 7.4 7.8  Albumin 4.0 - 5.0 g/dL 3.7(L) 3.8 3.8  AST 0 - 40 IU/L _0 ALT 0 - 44 IU/L _1 Alk Phosphatase 39 - 117 IU/L 159(H) 128(H) 114  Total Bilirubin 0.0 - 1.2 mg/dL 0.7 0.6 0.7  Bilirubin, Direct 0.0 - 0.3 mg/dL - - -     Filed Weights   01/15/19 1031  Weight: 231 lb (104.8 kg)    BMI Readings from Last 3 Encounters:  01/15/19 32.22 kg/m  11/14/18 31.93 kg/m  07/11/18 31.10 kg/m     No problems updated.    Patient Care Team    Relationship Specialty Notifications Start End  Mellody Dance, DO PCP - General Family Medicine  07/28/17   Minette Brine, [place]  [facility]  07/29/17       Patient Active Problem List   Diagnosis Date Noted  . Mixed diabetic hyperlipidemia associated with type 2 diabetes mellitus ([place]) 11/16/2017    Priority: High  . Hypertension associated with diabetes ([place]) 07/28/2017    Priority: High  . Diabetes mellitus ([place]) 07/28/2017    Priority: High  . Diabetic neuropathy, painful ([place]) 06/22/2012    Priority: High  . Diabetic peripheral neuropathy associated with type 2 diabetes mellitus ([place]) 11/16/2017    Priority: Medium  . Morbidly obese ([place]) 07/28/2017    Priority: Medium  . Vitamin D deficiency 07/28/2017    Priority: Low  . History of noncompliance with medical treatment 07/28/2017    Priority: Low  . Persistent proteinuria 11/14/2018  . Low TSH level 11/14/2018  . High risk medications (not anticoagulants) long-term use 11/14/2018  . Noncompliance with diet and medication regimen 11/14/2018  . Hiatal hernia 11/14/2018  . Anorexia 11/14/2018  . Diabetic ulcer of toe of right foot associated with diabetes mellitus due to underlying condition, with fat layer exposed ([place]) 07/11/2018  . Healthcare maintenance 07/11/2018  . Essential hypertension, benign 06/22/2012  . Pancreatitis, acute 06/22/2012  . Nausea with vomiting 06/22/2012  . Abdominal pain, epigastric 06/22/2012  . Acute hyperkalemia 06/22/2012  . Leukocytosis, unspecified 06/22/2012     Past Medical History:  Diagnosis Date  . Diabetes mellitus without complication ([place])   . Hypertension      Past Surgical History:  Procedure Laterality Date  . APPENDECTOMY    . BACK SURGERY       Family History  Problem Relation Age of Onset  . Cancer Mother   . Diabetes Mother   . Diabetes Father      Social History   Substance and Sexual Activity  Drug Use No  ,  Social History   Substance and Sexual Activity  Alcohol Use Yes   Comment: 3 drinks a month  ,  Social History   Tobacco Use  Smoking Status Former Smoker  . Quit date: 06/23/1995  .  Years since quitting: 23.5  Smokeless Tobacco Current User  . Types: Chew  ,    Current Outpatient Medications on File Prior to Visit  Medication Sig Dispense Refill  . atorvastatin (LIPITOR) 20 MG tablet Take 1 tablet (20 mg total) by mouth at bedtime. 90 tablet 0  . blood glucose meter kit and supplies Dispense based on patient and insurance preference. Use to test glucose fasting in the morning and then 2 hours after largest meal. (FOR ICD-10 E10.9, E11.9). 1 each 0  . folic acid (FOLVITE) 1 MG tablet Take 1 tablet (1 mg total) by mouth daily. (no more RF until bldwrk done) 90 tablet 1  . gabapentin (NEURONTIN) 600 MG tablet Take 1 tablet (600 mg total) by mouth 3 (three) times daily. Needs office visit for further refills 270 tablet 0  . Olmesartan-amLODIPine-HCTZ 20-5-12.5 MG TABS Take 1 tablet by mouth daily. 90 tablet 0  . Vitamin D, Ergocalciferol, (DRISDOL) 1.25 MG (50000 UT) CAPS capsule TAKE 1 CAPSULE BY MOUTH TWO TIMES A WEEK.  [facility] AND SUNDAY 10 capsule 0   No current facility-administered medications on file prior to visit.      No Known Allergies   Review of Systems:   General:  Denies fever, chills Optho/Auditory:   Denies visual changes, blurred vision Respiratory:   Denies SOB, cough, wheeze, DIB  Cardiovascular:   Denies chest pain, palpitations, painful respirations Gastrointestinal:   Denies nausea, vomiting, diarrhea.  Endocrine:     Denies new hot or cold intolerance Musculoskeletal:  Denies joint swelling, gait issues, or new unexplained myalgias/ arthralgias Skin:  Denies rash, suspicious lesions  Neurological:    Denies dizziness, unexplained weakness, numbness  Psychiatric/Behavioral:   Denies mood changes    Objective:     Blood pressure 126/74, pulse 74, temperature 98.2 F (36.8 C), height 5' 11" (1.803 m), weight 231 lb (104.8 kg), SpO2 100 %.  Body mass index is 32.22 kg/m.  General: Well Developed, well nourished, and in no acute  distress.  HEENT: Normocephalic, atraumatic, pupils equal round reactive to light, neck supple, No carotid bruits, no JVD Skin: Warm and dry, cap RF less 2 sec Cardiac: Regular rate and rhythm, S1, S2 WNL's, no murmurs rubs or gallops Respiratory: ECTA B/L, Not using accessory muscles, speaking in full sentences. NeuroM-Sk: Ambulates w/o assistance, moves ext * 4 w/o difficulty, sensation grossly intact.  Ext: scant edema b/l lower ext Psych: No HI/SI, judgement and insight good, Euthymic mood. Full Affect. Back:  Significant amount of muscle spasms and trigger points along the left paracervical and upper perithoracic spine on the left, and in the upper trapezius muscle.  Some trigger points appreciated in the right upper trapezius muscle.   Left Shoulder:  With internal and extremal rotation that patient does on his own, can cause a tremendous amount of popping and cracking in the left shoulder due to old injury.

## 2019-01-19 LAB — CBC WITH DIFFERENTIAL/PLATELET
Basophils Absolute: 0 10*3/uL (ref 0.0–0.2)
Basos: 0 %
EOS (ABSOLUTE): 0.2 10*3/uL (ref 0.0–0.4)
Eos: 3 %
Hematocrit: 34.4 % — ABNORMAL LOW (ref 37.5–51.0)
Hemoglobin: 12.3 g/dL — ABNORMAL LOW (ref 13.0–17.7)
Immature Grans (Abs): 0 10*3/uL (ref 0.0–0.1)
Immature Granulocytes: 0 %
Lymphocytes Absolute: 1.1 10*3/uL (ref 0.7–3.1)
Lymphs: 14 %
MCH: 32.5 pg (ref 26.6–33.0)
MCHC: 35.8 g/dL — ABNORMAL HIGH (ref 31.5–35.7)
MCV: 91 fL (ref 79–97)
Monocytes Absolute: 0.5 10*3/uL (ref 0.1–0.9)
Monocytes: 6 %
Neutrophils Absolute: 6 10*3/uL (ref 1.4–7.0)
Neutrophils: 77 %
Platelets: 100 10*3/uL — CL (ref 150–450)
RBC: 3.79 x10E6/uL — ABNORMAL LOW (ref 4.14–5.80)
RDW: 11.9 % (ref 11.6–15.4)
WBC: 7.9 10*3/uL (ref 3.4–10.8)

## 2019-01-19 LAB — FOLATE RBC
Folate, Hemolysate: 367 ng/mL
Folate, RBC: 1067 ng/mL (ref >498)

## 2019-01-19 LAB — FOLATE: Folate: 15.4 ng/mL (ref >3.0)

## 2019-01-25 ENCOUNTER — Ambulatory Visit (INDEPENDENT_AMBULATORY_CARE_PROVIDER_SITE_OTHER): Payer: BC Managed Care – PPO

## 2019-01-25 ENCOUNTER — Other Ambulatory Visit: Payer: Self-pay

## 2019-01-25 ENCOUNTER — Encounter: Payer: Self-pay | Admitting: Podiatry

## 2019-01-25 ENCOUNTER — Ambulatory Visit: Payer: BC Managed Care – PPO | Admitting: Podiatry

## 2019-01-25 VITALS — BP 141/82 | HR 81

## 2019-01-25 DIAGNOSIS — M14671 Charcot's joint, right ankle and foot: Secondary | ICD-10-CM | POA: Diagnosis not present

## 2019-01-25 DIAGNOSIS — M7750 Other enthesopathy of unspecified foot: Secondary | ICD-10-CM

## 2019-01-25 DIAGNOSIS — M778 Other enthesopathies, not elsewhere classified: Secondary | ICD-10-CM

## 2019-01-25 DIAGNOSIS — M7751 Other enthesopathy of right foot: Secondary | ICD-10-CM | POA: Diagnosis not present

## 2019-01-25 DIAGNOSIS — R2241 Localized swelling, mass and lump, right lower limb: Secondary | ICD-10-CM | POA: Diagnosis not present

## 2019-01-25 DIAGNOSIS — Z872 Personal history of diseases of the skin and subcutaneous tissue: Secondary | ICD-10-CM | POA: Diagnosis not present

## 2019-01-25 DIAGNOSIS — M779 Enthesopathy, unspecified: Secondary | ICD-10-CM | POA: Diagnosis not present

## 2019-01-26 ENCOUNTER — Telehealth: Payer: Self-pay | Admitting: *Deleted

## 2019-01-26 DIAGNOSIS — R0989 Other specified symptoms and signs involving the circulatory and respiratory systems: Secondary | ICD-10-CM

## 2019-01-26 DIAGNOSIS — Z872 Personal history of diseases of the skin and subcutaneous tissue: Secondary | ICD-10-CM

## 2019-01-26 NOTE — Telephone Encounter (Signed)
-----   Message from Trula Slade, DPM sent at 01/25/2019  4:25 PM EDT ----- Can you please order arterial studies?

## 2019-01-26 NOTE — Telephone Encounter (Signed)
Faxed orders to [facility]. 

## 2019-01-30 ENCOUNTER — Ambulatory Visit: Payer: BC Managed Care – PPO | Attending: Family Medicine | Admitting: Physical Therapy

## 2019-01-31 NOTE — Progress Notes (Signed)
Subjective:   Patient ID: [name], male   DOB: 53 y.o.   MRN: [id number]   HPI 53 year old male presents the office today for concerns of his right foot swelling and pain.  He states he has a history of a wound on the right foot he has been seen by the wound care center for this but he was released.  He states he has been having pain and swelling to the right foot.  Is been ongoing now for several months.  He previously did have an MRI as well as x-rays performed earlier this year.  No current ulceration.  He also states he has neuropathy.   Review of Systems  All other systems reviewed and are negative.  Past Medical History:  Diagnosis Date  . Diabetes mellitus without complication ([place])   . Hypertension     Past Surgical History:  Procedure Laterality Date  . APPENDECTOMY    . BACK SURGERY       Current Outpatient Medications:  .  atorvastatin (LIPITOR) 20 MG tablet, Take 1 tablet (20 mg total) by mouth at bedtime., Disp: 90 tablet, Rfl: 0 .  blood glucose meter kit and supplies, Dispense based on patient and insurance preference. Use to test glucose fasting in the morning and then 2 hours after largest meal. (FOR ICD-10 E10.9, E11.9)., Disp: 1 each, Rfl: 0 .  cyclobenzaprine (FLEXERIL) 10 MG tablet, Take 1 tablet (10 mg total) by mouth 3 (three) times daily as needed for muscle spasms., Disp: 30 tablet, Rfl: 0 .  folic acid (FOLVITE) 1 MG tablet, Take 1 tablet (1 mg total) by mouth daily. (no more RF until bldwrk done), Disp: 90 tablet, Rfl: 1 .  gabapentin (NEURONTIN) 600 MG tablet, Take 1 tablet (600 mg total) by mouth 3 (three) times daily. Needs office visit for further refills, Disp: 270 tablet, Rfl: 0 .  metFORMIN (GLUCOPHAGE) 500 MG tablet, Take 1 tablet (500 mg total) by mouth 2 (two) times daily with a meal. & only 1/2 tab BID on days you don't eat much, Disp: 60 tablet, Rfl: 2 .  Olmesartan-amLODIPine-HCTZ 20-5-12.5 MG TABS, Take 1 tablet by mouth daily., Disp: 90  tablet, Rfl: 0 .  Vitamin D, Ergocalciferol, (DRISDOL) 1.25 MG (50000 UT) CAPS capsule, TAKE 1 CAPSULE BY MOUTH TWO TIMES A WEEK.  WEDNESDAY AND SUNDAY, Disp: 10 capsule, Rfl: 0  No Known Allergies       Objective:  Physical Exam  General: AAO x3, NAD  Dermatological: Currently no ulceration present.  Vascular: Dorsalis Pedis artery and Posterior Tibial artery pedal pulses are 1/4 bilateral with immedate capillary fill time. Pedal hair growth present. There is no pain with calf compression, swelling, warmth, erythema.   Neruologic: Sensation decreased with Semmes Weinstein monofilament.  Musculoskeletal: There is swelling to the right foot but there is no significant erythema or warmth.  There is bony prominence of the dorsal aspect the midfoot and flattening of the arch on the right side compared to the left side.  No definitive rocker-bottom is present.  Muscular strength 5/5 in all groups tested bilateral.   Assessment:   Right foot Charcot     Plan:  -Treatment options discussed including all alternatives, risks, and complications -Etiology of symptoms were discussed -X-rays were obtained and reviewed with the patient.  Charcot changes are present to the midfoot.  Also independently reviewed the MRIs with x-rays he had done earlier this year.  Does appear to be Charcot although is starting to  heal.  I put him into an [place] boot today to help with swelling.  Recommend immobilization. I will have him follow-up with Liliane Channel for bracing.  We long discussion regards to the progression and etiology of Charcot -Order arterial studies to ensure circulation. -Monitor closely for any further ulceration  Trula Slade DPM

## 2019-02-01 ENCOUNTER — Other Ambulatory Visit: Payer: BC Managed Care – PPO | Admitting: Orthotics

## 2019-02-02 ENCOUNTER — Other Ambulatory Visit: Payer: Self-pay

## 2019-02-02 ENCOUNTER — Ambulatory Visit (HOSPITAL_COMMUNITY)
Admission: RE | Admit: 2019-02-02 | Discharge: 2019-02-02 | Disposition: A | Discharge status: Home / Self Care | Payer: BC Managed Care – PPO | Source: Ambulatory Visit | Attending: Cardiology | Admitting: Cardiology

## 2019-02-02 DIAGNOSIS — Z872 Personal history of diseases of the skin and subcutaneous tissue: Secondary | ICD-10-CM | POA: Diagnosis not present

## 2019-02-02 DIAGNOSIS — R0989 Other specified symptoms and signs involving the circulatory and respiratory systems: Secondary | ICD-10-CM

## 2019-02-05 ENCOUNTER — Other Ambulatory Visit: Payer: BC Managed Care – PPO | Admitting: Orthotics

## 2019-02-09 ENCOUNTER — Telehealth: Payer: Self-pay | Admitting: *Deleted

## 2019-02-09 ENCOUNTER — Other Ambulatory Visit: Payer: Self-pay

## 2019-02-09 ENCOUNTER — Other Ambulatory Visit: Payer: BC Managed Care – PPO | Admitting: Orthotics

## 2019-02-09 DIAGNOSIS — Z872 Personal history of diseases of the skin and subcutaneous tissue: Secondary | ICD-10-CM

## 2019-02-09 DIAGNOSIS — R0989 Other specified symptoms and signs involving the circulatory and respiratory systems: Secondary | ICD-10-CM

## 2019-02-09 DIAGNOSIS — M14671 Charcot's joint, right ankle and foot: Secondary | ICD-10-CM

## 2019-02-09 NOTE — Telephone Encounter (Signed)
-----   Message from Trula Slade, DPM sent at 02/09/2019  6:09 AM EDT ----- Val- please let him know that the circulation test was abnormal. Although his wound has healed I would still like him to see vascular or Dr. 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 Thanks.

## 2019-02-09 NOTE — Telephone Encounter (Signed)
Pt called for results and I informed of Dr. Leigh Aurora review of results and referral information. Pt states the girl that did the test stated it was normal. I informed pt the test was review by a cardiologist that saw more in the results.

## 2019-02-09 NOTE — Telephone Encounter (Signed)
Faxed referral [place].

## 2019-02-09 NOTE — Telephone Encounter (Signed)
Left message to call for results and instructions.

## 2019-02-13 ENCOUNTER — Ambulatory Visit: Payer: BC Managed Care – PPO | Admitting: Family Medicine

## 2019-02-14 ENCOUNTER — Ambulatory Visit: Payer: BC Managed Care – PPO | Admitting: Cardiovascular Disease

## 2019-02-15 ENCOUNTER — Ambulatory Visit: Payer: BC Managed Care – PPO | Admitting: Cardiovascular Disease

## 2019-02-15 ENCOUNTER — Other Ambulatory Visit: Payer: Self-pay

## 2019-02-15 ENCOUNTER — Encounter: Payer: Self-pay | Admitting: Cardiovascular Disease

## 2019-02-15 DIAGNOSIS — E1159 Type 2 diabetes mellitus with other circulatory complications: Secondary | ICD-10-CM

## 2019-02-15 DIAGNOSIS — E782 Mixed hyperlipidemia: Secondary | ICD-10-CM

## 2019-02-15 DIAGNOSIS — I739 Peripheral vascular disease, unspecified: Secondary | ICD-10-CM

## 2019-02-15 DIAGNOSIS — E785 Hyperlipidemia, unspecified: Secondary | ICD-10-CM | POA: Insufficient documentation

## 2019-02-15 DIAGNOSIS — I10 Essential (primary) hypertension: Secondary | ICD-10-CM

## 2019-02-15 DIAGNOSIS — I152 Hypertension secondary to endocrine disorders: Secondary | ICD-10-CM

## 2019-02-15 MED ORDER — CILOSTAZOL 50 MG PO TABS: 50.0000 mg | ORAL_TABLET | Freq: Two times a day (BID) | ORAL | 3 refills | Status: DC

## 2019-02-15 NOTE — Assessment & Plan Note (Signed)
Patient essential potential blood pressure measured at 110/58.  He is on olmesartan, amlodipine and hydrochlorothiazide.

## 2019-02-15 NOTE — Assessment & Plan Note (Addendum)
[name] was referred to me by Dr. Earleen Newport for an ulcer which has since healed.  There is a question of Charcot joint of his right foot.  Does complain of claudication.  He had lower extreme arterial Doppler studies performed in our office 02/02/2019 revealing noncompressible right ABI and a left 2.93.  He had triphasic waveforms in his right SFA and popliteal artery as well as in his right although he did have tibial disease in the left.  I cannot palpate pulses in his right foot.  At this point, with a healed wound, I do not see a need to proceed with an invasive approach.  We will start Pletal to see if this improves his claudication symptoms.

## 2019-02-15 NOTE — Progress Notes (Signed)
02/15/2019 [name]   [birth year]  [id number]  Primary Physician Jeffrey Dance, DO Primary Cardiologist: Jeffrey Harp MD [name], [place]  HPI:  [name] is a 53 y.o. mildly overweight married African-American male father of 1 child referred by [name] for peripheral arterial evaluation.  He works as a Microbiologist at [employer].  He smoked remotely does have history of treated hypertension, hyperlipidemia long history of diabetes.  He denies chest pain or shortness of breath.  Never had a heart attack or stroke there is no family history for heart disease.  Did have an ulcer on his right foot which has since healed but is a question of it Charcot foot on the right.  He had Doppler studies performed in our office 02/02/2019 which revealed noncompressible vessels on the right with an left ABI 0.93.  He had tibial vessel disease on the left.  He does complain of claudication.   Current Meds  Medication Sig  . atorvastatin (LIPITOR) 20 MG tablet Take 1 tablet (20 mg total) by mouth at bedtime.  . blood glucose meter kit and supplies Dispense based on patient and insurance preference. Use to test glucose fasting in the morning and then 2 hours after largest meal. (FOR ICD-10 E10.9, E11.9).  . cyclobenzaprine (FLEXERIL) 10 MG tablet Take 1 tablet (10 mg total) by mouth 3 (three) times daily as needed for muscle spasms.  . folic acid (FOLVITE) 1 MG tablet Take 1 tablet (1 mg total) by mouth daily. (no more RF until bldwrk done)  . gabapentin (NEURONTIN) 600 MG tablet Take 1 tablet (600 mg total) by mouth 3 (three) times daily. Needs office visit for further refills  . metFORMIN (GLUCOPHAGE) 500 MG tablet Take 1 tablet (500 mg total) by mouth 2 (two) times daily with a meal. & only 1/2 tab BID on days you don't eat much  . Olmesartan-amLODIPine-HCTZ 20-5-12.5 MG TABS Take 1 tablet by mouth daily.  . Vitamin D, Ergocalciferol, (DRISDOL) 1.25 MG (50000 UT) CAPS capsule TAKE  1 CAPSULE BY MOUTH TWO TIMES A WEEK.  WEDNESDAY AND SUNDAY     No Known Allergies  Social History   Socioeconomic History  . Marital status: Married    Spouse name: Not on file  . Number of children: Not on file  . Years of education: Not on file  . Highest education level: Not on file  Occupational History  . Not on file  Social Needs  . Financial resource strain: Not on file  . Food insecurity    Worry: Not on file    Inability: Not on file  . Transportation needs    Medical: Not on file    Non-medical: Not on file  Tobacco Use  . Smoking status: Former Smoker    Quit date: 06/23/1995    Years since quitting: 23.6  . Smokeless tobacco: Current User    Types: Chew  Substance and Sexual Activity  . Alcohol use: Yes    Comment: 3 drinks a month  . Drug use: No  . Sexual activity: Yes    Birth control/protection: None  Lifestyle  . Physical activity    Days per week: Not on file    Minutes per session: Not on file  . Stress: Not on file  Relationships  . Social Herbalist on phone: Not on file    Gets together: Not on file    Attends religious service: Not on  file    Active member of club or organization: Not on file    Attends meetings of clubs or organizations: Not on file    Relationship status: Not on file  . Intimate partner violence    Fear of current or ex partner: Not on file    Emotionally abused: Not on file    Physically abused: Not on file    Forced sexual activity: Not on file  Other Topics Concern  . Not on file  Social History Narrative  . Not on file     Review of Systems: General: negative for chills, fever, night sweats or weight changes.  Cardiovascular: negative for chest pain, dyspnea on exertion, edema, orthopnea, palpitations, paroxysmal nocturnal dyspnea or shortness of breath Dermatological: negative for rash Respiratory: negative for cough or wheezing Urologic: negative for hematuria Abdominal: negative for nausea,  vomiting, diarrhea, bright red blood per rectum, melena, or hematemesis Neurologic: negative for visual changes, syncope, or dizziness All other systems reviewed and are otherwise negative except as noted above.    Blood pressure (!) 110/58, pulse 95, temperature 97.7 F (36.5 C), temperature source Temporal, height _0  (1.803 m), weight 238 lb (108 kg), SpO2 99 %.  General appearance: alert and no distress Neck: no adenopathy, no carotid bruit, no JVD, supple, symmetrical, trachea midline and thyroid not enlarged, symmetric, no tenderness/mass/nodules Lungs: clear to auscultation bilaterally Heart: regular rate and rhythm, S1, S2 normal, no murmur, click, rub or gallop Extremities: extremities normal, atraumatic, no cyanosis or edema Pulses: 2+ and symmetric Skin: Skin color, texture, turgor normal. No rashes or lesions Neurologic: Alert and oriented X 3, normal strength and tone. Normal symmetric reflexes. Normal coordination and gait  EKG sinus rhythm at 95 without ST or T wave changes.  I personally reviewed this EKG.  ASSESSMENT AND PLAN:   Hypertension associated with diabetes ([place]) Patient essential potential blood pressure measured at 110/58.  He is on olmesartan, amlodipine and hydrochlorothiazide.  Hyperlipidemia History of hyperlipidemia on statin therapy with lipid profile performed 11/30/2018 revealing total cholesterol 86, LDL 47 and HDL of 29.  Peripheral arterial disease [facility]) [name] was referred to me by [name] for an ulcer which has since healed.  There is a question of Charcot joint of his right foot.  Does complain of claudication.  He had lower extreme arterial Doppler studies performed in our office 02/02/2019 revealing noncompressible right ABI and a left 2.93.  He had triphasic waveforms in his right SFA and popliteal artery as well as in his right although he did have tibial disease in the left.  I cannot palpate pulses in his right foot.  At this point,  with a healed wound, I do not see a need to proceed with an invasive approach.  We will start Pletal to see if this improves his claudication symptoms.      Jeffrey Harp MD FACP,FACC,FAHA, FSCAI 02/15/2019 4:00 PM

## 2019-02-15 NOTE — Patient Instructions (Addendum)
Medication Instructions:  Your physician has recommended you make the following change in your medication:   START CILOSTAZOL (PLETAL) 50 MG BY MOUTH TWICE A DAY  If you need a refill on your cardiac medications before your next appointment, please call your pharmacy.   Lab work: NONE If you have labs (blood work) drawn today and your tests are completely normal, you will receive your results only by: Marland Kitchen MyChart Message (if you have MyChart) OR . A paper copy in the mail If you have any lab test that is abnormal or we need to change your treatment, we will call you to review the results.  Testing/Procedures: NONE  Follow-Up: At [facility], you and your health needs are our priority.  As part of our continuing mission to provide you with exceptional heart care, we have created designated Provider Care Teams.  These Care Teams include your primary Cardiologist (physician) and Advanced Practice Providers (APPs -  Physician Assistants and Nurse Practitioners) who all work together to provide you with the care you need, when you need it. You will need a follow up appointment in 3 months with Dr. Quay Burow.  Please call our office 2 months in advance to schedule this appointment.

## 2019-02-15 NOTE — Assessment & Plan Note (Signed)
History of hyperlipidemia on statin therapy with lipid profile performed 11/30/2018 revealing total cholesterol 86, LDL 47 and HDL of 29.

## 2019-02-15 NOTE — Assessment & Plan Note (Signed)
>>  ASSESSMENT AND PLAN FOR HYPERLIPIDEMIA WRITTEN ON 02/15/2019  3:59 PM BY BERRY, Pearletha Forge, MD  History of hyperlipidemia on statin therapy with lipid profile performed 11/30/2018 revealing total cholesterol 86, LDL 47 and HDL of 29.

## 2019-02-26 ENCOUNTER — Encounter: Payer: Self-pay | Admitting: Family Medicine

## 2019-02-26 ENCOUNTER — Other Ambulatory Visit: Payer: Self-pay

## 2019-02-26 ENCOUNTER — Other Ambulatory Visit: Payer: Self-pay | Admitting: Family Medicine

## 2019-02-26 ENCOUNTER — Ambulatory Visit (INDEPENDENT_AMBULATORY_CARE_PROVIDER_SITE_OTHER): Payer: BC Managed Care – PPO | Admitting: Family Medicine

## 2019-02-26 VITALS — BP 105/67 | HR 112 | Temp 98.1°F | Resp 12 | Ht 71.0 in | Wt 234.5 lb

## 2019-02-26 DIAGNOSIS — D696 Thrombocytopenia, unspecified: Secondary | ICD-10-CM

## 2019-02-26 DIAGNOSIS — E559 Vitamin D deficiency, unspecified: Secondary | ICD-10-CM

## 2019-02-26 DIAGNOSIS — E1140 Type 2 diabetes mellitus with diabetic neuropathy, unspecified: Secondary | ICD-10-CM | POA: Diagnosis not present

## 2019-02-26 DIAGNOSIS — E1169 Type 2 diabetes mellitus with other specified complication: Secondary | ICD-10-CM

## 2019-02-26 LAB — POCT GLYCOSYLATED HEMOGLOBIN (HGB A1C): Hemoglobin A1C: 6.7 % — AB (ref 4.0–5.6)

## 2019-02-26 MED ORDER — GABAPENTIN 600 MG PO TABS: ORAL_TABLET | ORAL | 0 refills | Status: DC

## 2019-02-26 MED ORDER — VITAMIN D (ERGOCALCIFEROL) 1.25 MG (50000 UNIT) PO CAPS: ORAL_CAPSULE | ORAL | 3 refills | Status: DC

## 2019-02-26 NOTE — Progress Notes (Signed)
Impression and Recommendations:    1. Anemia with low platelet count ([place])   2. Thrombocytopenia ([place])   3. Diabetic neuropathy, painful ([place])   4. Vitamin D deficiency   5. Type 2 diabetes mellitus with other specified complication, without long-term current use of insulin ([name])     Chronic Low Back Pain - Discussed patient's options for pain management, including interventional procedures. - Reviewed patient's symptoms and options for relief. - Extensive education provided and all questions answered.  - Patient declines referral today.  - Will continue to monitor.  Foot Health - Followed by [place] patient to follow-up with appointment for bracing and further care as scheduled. - Will continue to monitor.  Diabetic Neuropathy, Painful - Discussed increasing pt's prescription Gabapentin. - Per patient, tolerates Gabapentin well without side-effects. - Increased dose of Gabapentin to 600, 600, and 1200 MGs daily.  - Will continue to monitor.  Diabetes Mellitus - Advised patient to call his insurance and ask questions about options for prickless blood sugar monitoring.  - No changes made to treatment plan today.  See med list. - Patient will continue management on metformin as prescribed.  - Encouraged patient to continue prudent lifestyle habits, low-carb diet, and regular physical activity.  - Will continue to monitor.  Hyperlipidemia Associated with DM - No changes made to treatment plan. - Continue statin at bedtime as prescribed.  See med list. - Will continue to monitor and re-check as recommended.  Thrombocytopenia, Anemia w/ Low Platelet Count - Reviewed results of CBC past and present with patient today.  - On 08/14/2008, platelets were 89. - 6 years ago, platelets were 139. - Platelets last check at 100.  - Discussed referral to hematology PRN. - Patient agrees to referral to hematology today.  Referral placed. - If patient does not  hear from hematology in one week, patient will call front desk for f/up.  -  Will continue to monitor and re-check PRN.  Recommendations - Return in 3 months for diabetes, HLD, HTN, increase in gabapentin. - If neuropathy does not improve, patient will call in for further follow-up PRN.    Orders Placed This Encounter  Procedures  . Ambulatory referral to Hematology  . POCT glycosylated hemoglobin (Hb A1C)    Meds ordered this encounter  Medications  . DISCONTD: gabapentin (NEURONTIN) 600 MG tablet    Sig: 636m q am, 6020mq afternoon and 120071m hs    Dispense:  360 tablet    Refill:  0  . Vitamin D, Ergocalciferol, (DRISDOL) 1.25 MG (50000 UT) CAPS capsule    Sig: TAKE 1 CAPSULE BY MOUTH TWO TIMES A WEEK.  WEDNESDAY AND SUNDAY    Dispense:  24 capsule    Refill:  3    Medications Discontinued During This Encounter  Medication Reason  . gabapentin (NEURONTIN) 600 MG tablet   . Vitamin D, Ergocalciferol, (DRISDOL) 1.25 MG (50000 UT) CAPS capsule Reorder     Patient was interviewed and evaluated by me/ [facility] members in the clinic today for 40+ minutes, with over 50% of my time spent in face to face counseling of patients various medical conditions, treatment plans of those medical conditions including medicine management and lifestyle modification, strategies to improve health and well being; and in coordination of care.   SEE TREATMENT PLAN FOR DETAILS   Gross side effects, risk and benefits, and alternatives of medications and treatment plan in general discussed with patient.  Patient  is aware that all medications have potential side effects and we are unable to predict every side effect or drug-drug interaction that may occur. Patient will call with any questions prior to using medication if they have concerns.    Expresses verbal understanding and consents to current therapy and treatment regimen.  No barriers to understanding were identified.  Red flag symptoms and  signs discussed in detail.  Patient expressed understanding regarding what to do in case of emergency\urgent symptoms  Please see AVS handed out to patient at the end of our visit for further patient instructions/ counseling done pertaining to today's office visit.   Return for inc in gabapentin dose + DM, HTN, HLD follow up every 7mo     Note:  This note was prepared with assistance of Dragon voice recognition software. Occasional wrong-word or sound-a-like substitutions may have occurred due to the inherent limitations of voice recognition software.   This document serves as a record of services personally performed by DMellody Dance DO. It was created on her behalf by KToni Amend a trained medical scribe. The creation of this record is based on the scribe's personal observations and the provider's statements to them.   This case required medical decision making of at least moderate complexity. The above documentation has been reviewed to be accurate and was completed by DMarjory Sneddon D.O.      --------------------------------------------------------------------------------------------------------------------------------------------------------------------------------------------------------------------------------------------    Subjective:     HPI: BKAMARIE PALMAis a 53y.o. male [facility].  - Disability Concerns Notes he had to go back to work to receive money and benefits, and "I don't have a year to fight for disability."  Says insurance doesn't cover his chiropractor, but he wants to afford to go back to his chiropractor first before anything else.  - Foot - Seen by Wound Specialist, Vein Specialist Says "the doctor they sent me to about my veins never told me my foot was broken."  Says he had X-ray, MRI, "anything they could do to cost money, they did it, and they didn't even  tell me my foot was broken."  Says his right foot has a "big lump going across it" and that "I need it re-broken to fix it."  Patient confirms that he was in a boot for a while, and told to follow-up about bracing.  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  Patient denies nausea or other side-effects.  - Thrombocytopenia, Anemia w/ Low Platelet Count States he has no present concerns with his blood.  Notes in the past, they thought he had cirrhosis or hepatitis, which he thought was odd.  Notes "when I eat, my head heats up, and then I start freezing because my head's wet and air's blowing and  everyone else is like 'what's wrong with you.'"  HPI:   Diabetes Mellitus:  Home glucose readings:    Thinks his sugars are doing well, but he would like to get a meter where he doesn't have to prick himself.  Notes that "nothing heals right" on his body, citing an example where his puppy bit his foot and a cheloid developed.  Notes now he's worried he's going to develop a cheloid if he keeps pricking his fingers.   - Patient reports good compliance with therapy plan: medication and/or lifestyle modification  He continues on his metformin as prescribed.  Notes "my A1c was 7.0 and I wasn't taking it."  - His denies acute concerns or problems related to treatment plan  - He denies new concerns.   Denies polyuria/polydipsia, hypo/ hyperglycemia symptoms.  Denies new onset of: chest pain, exercise intolerance, shortness of breath, dizziness, visual changes, headache, lower extremity swelling or claudication.   Last A1C in the office was:  Lab Results  Component Value Date   HGBA1C 6.7 (A) 02/26/2019   HGBA1C 6.9 (A) 11/14/2018   HGBA1C 6.7 (H) 05/18/2018   Lab Results  Component Value Date   MICROALBUR 10 11/14/2018   LDLCALC 47 11/30/2018   CREATININE 1.16 11/30/2018   BP Readings from Last 3 Encounters:  02/26/19 105/67  02/15/19 (!) 110/58  01/25/19 (!) 141/82   Wt Readings from Last 3 Encounters:  02/26/19 234 lb 8 oz (106.4 kg)  02/15/19 238 lb (108 kg)  01/15/19 231 lb (104.8 kg)    HPI:  Hyperlipidemia:  53 y.o. male here for cholesterol follow-up.   - Patient reports good compliance with treatment plan of:  medication and/ or lifestyle management.    Confirms continues taking his statin nightly at bedtime.  - Patient denies any acute concerns or problems with management plan   - He denies new onset of: myalgias, arthralgias, increased fatigue more than normal, chest pains, exercise intolerance, shortness of breath, dizziness, visual changes, headache, lower extremity swelling or claudication.   Most recent cholesterol panel was:  Lab Results  Component Value Date   CHOL 86 (L) 11/30/2018   HDL 29 (L) 11/30/2018   LDLCALC 47 11/30/2018   LDLDIRECT 46 11/30/2018   TRIG 49 11/30/2018   CHOLHDL 3.0 11/30/2018   Hepatic Function Latest Ref Rng & Units 11/30/2018 05/18/2018 07/28/2017  Total Protein 6.0 - 8.5 g/dL 7.9 7.4 7.8  Albumin 4.0 - 5.0 g/dL 3.7(L) 3.8 3.8  AST 0 - 40 IU/L _0 ALT 0 - 44 IU/L _1 Alk Phosphatase 39 - 117 IU/L 159(H) 128(H) 114  Total Bilirubin 0.0 - 1.2 mg/dL 0.7 0.6 0.7  Bilirubin, Direct 0.0 - 0.3 mg/dL - - -       Wt Readings from Last 3 Encounters:  02/26/19 234 lb 8 oz (106.4 kg)  02/15/19 238 lb (108 kg)   01/15/19 231 lb (104.8 kg)   BP Readings from Last 3 Encounters:  02/26/19 105/67  02/15/19 (!) 110/58  01/25/19 (!) 141/82   Pulse Readings from Last 3 Encounters:  02/26/19 (!) 112  02/15/19 95  01/25/19 81   BMI Readings from Last 3 Encounters:  02/26/19 32.71 kg/m  02/15/19 33.19 kg/m  01/15/19 32.22 kg/m     Patient Care Team    Relationship Specialty Notifications Start End  Mellody Dance, DO PCP - General Family Medicine  07/28/17   Minette Brine, [place]  General Practice  07/29/17  Patient Active Problem List   Diagnosis Date Noted  . Mixed diabetic hyperlipidemia associated with type 2 diabetes mellitus ([place]) 11/16/2017    Priority: High  . Hypertension associated with diabetes ([place]) 07/28/2017    Priority: High  . Diabetes mellitus ([place]) 07/28/2017    Priority: High  . Diabetic neuropathy, painful ([place]) 06/22/2012    Priority: High  . Diabetic peripheral neuropathy associated with type 2 diabetes mellitus ([place]) 11/16/2017    Priority: Medium  . Morbidly obese ([place]) 07/28/2017    Priority: Medium  . Vitamin D deficiency 07/28/2017    Priority: Low  . History of noncompliance with medical treatment 07/28/2017    Priority: Low  . Peripheral arterial disease ([place]) 02/15/2019  . Hyperlipidemia 02/15/2019  . Persistent proteinuria 11/14/2018  . Low TSH level 11/14/2018  . High risk medications (not anticoagulants) long-term use 11/14/2018  . Noncompliance with diet and medication regimen 11/14/2018  . Hiatal hernia 11/14/2018  . Anorexia 11/14/2018  . Diabetic ulcer of toe of right foot associated with diabetes mellitus due to underlying condition, with fat layer exposed ([place]) 07/11/2018  . Healthcare maintenance 07/11/2018  . Essential hypertension, benign 06/22/2012  . Pancreatitis, acute 06/22/2012  . Nausea with vomiting 06/22/2012  . Abdominal pain, epigastric 06/22/2012  . Acute hyperkalemia 06/22/2012  . Leukocytosis, unspecified 06/22/2012     Past Medical history, Surgical history, Family history, Social history, Allergies and Medications have been entered into the medical record, reviewed and changed as needed.    Current Meds  Medication Sig  . atorvastatin (LIPITOR) 20 MG tablet Take 1 tablet (20 mg total) by mouth at bedtime.  . blood glucose meter kit and supplies Dispense based on patient and insurance preference. Use to test glucose fasting in the morning and then 2 hours after largest meal. (FOR ICD-10 E10.9, E11.9).  . cilostazol (PLETAL) 50 MG tablet Take 1 tablet (50 mg total) by mouth 2 (two) times daily.  . cyclobenzaprine (FLEXERIL) 10 MG tablet Take 1 tablet (10 mg total) by mouth 3 (three) times daily as needed for muscle spasms.  . folic acid (FOLVITE) 1 MG tablet Take 1 tablet (1 mg total) by mouth daily. (no more RF until bldwrk done)  . metFORMIN (GLUCOPHAGE) 500 MG tablet Take 1 tablet (500 mg total) by mouth 2 (two) times daily with a meal. & only 1/2 tab BID on days you don't eat much  . Vitamin D, Ergocalciferol, (DRISDOL) 1.25 MG (50000 UT) CAPS capsule TAKE 1 CAPSULE BY MOUTH TWO TIMES A WEEK.  WEDNESDAY AND SUNDAY  . [DISCONTINUED] Vitamin D, Ergocalciferol, (DRISDOL) 1.25 MG (50000 UT) CAPS capsule TAKE 1 CAPSULE BY MOUTH TWO TIMES A WEEK.  WEDNESDAY AND SUNDAY    Allergies:  No Known Allergies   Review of Systems:  A fourteen system review of systems was performed and found to be positive as per HPI.   Objective:   Blood pressure 105/67, pulse (!) 112, temperature 98.1 F (36.7 C), temperature source Oral, resp. rate 12, height 5' 11" (1.803 m), weight 234 lb 8 oz (106.4 kg), SpO2 100 %. Body mass index is 32.71 kg/m. General:  Well Developed, well nourished, appropriate for stated age.  Neuro:  Alert and oriented,  extra-ocular muscles intact  HEENT:  Normocephalic, atraumatic, neck supple, no carotid bruits appreciated  Skin:  no gross rash, warm, pink. Cardiac:  RRR, S1 S2 Respiratory:   ECTA B/L and A/P, Not using accessory muscles, speaking in full sentences- unlabored. Vascular:  Ext warm, no cyanosis apprec.; cap RF less 2 sec. Psych:  No HI/SI, judgement and insight good, Euthymic mood. Full Affect.

## 2019-02-26 NOTE — Patient Instructions (Addendum)
Please call your medical insurance and see if they cover any of the prickless / non lancet, non finger-prick blood sugar monitoring systems.  Please make sure you follow-up with podiatry for the brace for your right foot for the Charcot injury

## 2019-02-27 ENCOUNTER — Other Ambulatory Visit: Payer: Self-pay | Admitting: Family Medicine

## 2019-02-27 DIAGNOSIS — E1140 Type 2 diabetes mellitus with diabetic neuropathy, unspecified: Secondary | ICD-10-CM

## 2019-02-27 NOTE — Telephone Encounter (Signed)
Pt came by the office stating that when Dr. Raliegh Scarlet increased dosage of gabapentin yesterday, he thought that he still had enough medications at home that he did not need a refill.  However, pt states that when he got home yesterday, he did not have any gabapentin.  Refill sent to pharmacy.  Charyl Bigger, CMA

## 2019-02-28 ENCOUNTER — Telehealth: Payer: Self-pay | Admitting: Hematology and Oncology

## 2019-02-28 NOTE — Telephone Encounter (Signed)
A new hem appt has been scheduled for [name] to see Dr. Lindi Adie on 12/1 at 3:45pm. I offered to schedule an earlier date and time, but the pt said he had just began a new job and didn't have any time. [name] is aware to arrive 15 minutes early.

## 2019-03-06 ENCOUNTER — Telehealth: Payer: Self-pay | Admitting: Podiatry

## 2019-03-06 NOTE — Telephone Encounter (Signed)
Pt has previously came in for right foot/ ankle swelling. He has continued to work and now his has aggravated his foot again and yesterday he was unable to work due to the swelling. He is wondering if he could get a note for this condition or would he need to come in

## 2019-03-27 ENCOUNTER — Other Ambulatory Visit: Payer: Self-pay

## 2019-03-27 ENCOUNTER — Ambulatory Visit (INDEPENDENT_AMBULATORY_CARE_PROVIDER_SITE_OTHER): Payer: BC Managed Care – PPO | Admitting: Orthotics

## 2019-03-27 ENCOUNTER — Ambulatory Visit (INDEPENDENT_AMBULATORY_CARE_PROVIDER_SITE_OTHER): Payer: BC Managed Care – PPO

## 2019-03-27 ENCOUNTER — Ambulatory Visit (INDEPENDENT_AMBULATORY_CARE_PROVIDER_SITE_OTHER): Payer: BC Managed Care – PPO | Admitting: Podiatry

## 2019-03-27 DIAGNOSIS — M14671 Charcot's joint, right ankle and foot: Secondary | ICD-10-CM | POA: Diagnosis not present

## 2019-03-27 DIAGNOSIS — M779 Enthesopathy, unspecified: Secondary | ICD-10-CM

## 2019-03-27 DIAGNOSIS — M79671 Pain in right foot: Secondary | ICD-10-CM | POA: Diagnosis not present

## 2019-03-27 DIAGNOSIS — T1490XA Injury, unspecified, initial encounter: Secondary | ICD-10-CM | POA: Diagnosis not present

## 2019-03-27 DIAGNOSIS — R2241 Localized swelling, mass and lump, right lower limb: Secondary | ICD-10-CM

## 2019-03-27 DIAGNOSIS — S99921A Unspecified injury of right foot, initial encounter: Secondary | ICD-10-CM

## 2019-03-27 DIAGNOSIS — M778 Other enthesopathies, not elsewhere classified: Secondary | ICD-10-CM

## 2019-03-27 DIAGNOSIS — M25571 Pain in right ankle and joints of right foot: Secondary | ICD-10-CM

## 2019-03-27 DIAGNOSIS — R0989 Other specified symptoms and signs involving the circulatory and respiratory systems: Secondary | ICD-10-CM

## 2019-03-27 DIAGNOSIS — Z872 Personal history of diseases of the skin and subcutaneous tissue: Secondary | ICD-10-CM

## 2019-03-28 ENCOUNTER — Telehealth: Payer: Self-pay | Admitting: Podiatry

## 2019-03-28 NOTE — Telephone Encounter (Signed)
Patient called and stated that Dr. Jacqualyn Posey was suppose to send a note to his pcp for a water pill. His pcp is not in the office and wanted to know what to do next.

## 2019-03-28 NOTE — Telephone Encounter (Signed)
I spoke with pt and asked if PCP had someone that took call for him when he was out of the office. Pt states only for emergencies. I told pt that fluid pills were not in the podiatrist formulary, which is why Dr. Jacqualyn Posey had referred to PCP for the medication. I instructed pt to rest, and elevate as much as possible until he could contact the PCP. Pt states understanding.

## 2019-04-02 NOTE — Progress Notes (Signed)
[facility] CONSULT NOTE  Patient Care Team: Mellody Dance, DO as PCP - General (Family Medicine) Minette Brine, FNP (General Practice)  CHIEF COMPLAINTS/PURPOSE OF CONSULTATION:  Newly diagnosed anemia and thrombocytopenia  HISTORY OF PRESENTING ILLNESS:  [name] 53 y.o. male is here because of recent diagnosis of anemia and thrombocytopenia. He is referred by his PCP, Dr. Mellody Dance. Labs on 11/30/18 showed iron saturation 21%, ferritin 175. Labs on 01/15/19 showed: Hg 12.3, HCT 34.4, MCV 91, platelets 100, folate 15.4. He presents to the clinic today for initial evaluation.   I reviewed his records extensively and collaborated the history with the patient.  MEDICAL HISTORY:  Past Medical History:  Diagnosis Date  . Diabetes mellitus without complication ([place])   . Hypertension     SURGICAL HISTORY: Past Surgical History:  Procedure Laterality Date  . APPENDECTOMY    . BACK SURGERY      SOCIAL HISTORY: Social History   Socioeconomic History  . Marital status: Married    Spouse name: Not on file  . Number of children: Not on file  . Years of education: Not on file  . Highest education level: Not on file  Occupational History  . Not on file  Social Needs  . Financial resource strain: Not on file  . Food insecurity    Worry: Not on file    Inability: Not on file  . Transportation needs    Medical: Not on file    Non-medical: Not on file  Tobacco Use  . Smoking status: Former Smoker    Quit date: 06/23/1995    Years since quitting: 23.7  . Smokeless tobacco: Current User    Types: Chew  Substance and Sexual Activity  . Alcohol use: Yes    Comment: 3 drinks a month  . Drug use: No  . Sexual activity: Yes    Birth control/protection: None  Lifestyle  . Physical activity    Days per week: Not on file    Minutes per session: Not on file  . Stress: Not on file  Relationships  . Social Herbalist on phone: Not on file   Gets together: Not on file    Attends religious service: Not on file    Active member of club or organization: Not on file    Attends meetings of clubs or organizations: Not on file    Relationship status: Not on file  . Intimate partner violence    Fear of current or ex partner: Not on file    Emotionally abused: Not on file    Physically abused: Not on file    Forced sexual activity: Not on file  Other Topics Concern  . Not on file  Social History Narrative  . Not on file    FAMILY HISTORY: Family History  Problem Relation Age of Onset  . Cancer Mother   . Diabetes Mother   . Diabetes Father     ALLERGIES:  has No Known Allergies.  MEDICATIONS:  Current Outpatient Medications  Medication Sig Dispense Refill  . atorvastatin (LIPITOR) 20 MG tablet Take 1 tablet (20 mg total) by mouth at bedtime. 90 tablet 0  . blood glucose meter kit and supplies Dispense based on patient and insurance preference. Use to test glucose fasting in the morning and then 2 hours after largest meal. (FOR ICD-10 E10.9, E11.9). 1 each 0  . cilostazol (PLETAL) 50 MG tablet Take 1 tablet (50 mg total) by mouth  2 (two) times daily. 90 tablet 3  . cyclobenzaprine (FLEXERIL) 10 MG tablet Take 1 tablet (10 mg total) by mouth 3 (three) times daily as needed for muscle spasms. 30 tablet 0  . folic acid (FOLVITE) 1 MG tablet Take 1 tablet (1 mg total) by mouth daily. (no more RF until bldwrk done) 90 tablet 1  . gabapentin (NEURONTIN) 600 MG tablet TAKE 1 TABLET BY MOUTH THREE TIMES DAILY 270 tablet 0  . metFORMIN (GLUCOPHAGE) 500 MG tablet Take 1 tablet (500 mg total) by mouth 2 (two) times daily with a meal. & only 1/2 tab BID on days you don't eat much 60 tablet 2  . Olmesartan-amLODIPine-HCTZ 20-5-12.5 MG TABS Take 1 tablet by mouth daily. 90 tablet 0  . Vitamin D, Ergocalciferol, (DRISDOL) 1.25 MG (50000 UT) CAPS capsule TAKE 1 CAPSULE BY MOUTH TWO TIMES A WEEK.  [facility] AND SUNDAY 24 capsule 3   No  current facility-administered medications for this visit.     REVIEW OF SYSTEMS:   Constitutional: Denies fevers, chills or abnormal night sweats Eyes: Denies blurriness of vision, double vision or watery eyes Ears, nose, mouth, throat, and face: Denies mucositis or sore throat Respiratory: Denies cough, dyspnea or wheezes Cardiovascular: Denies palpitation, chest discomfort or lower extremity swelling Gastrointestinal: Abdominal discomfort and difficulty swallowing Skin: Denies abnormal skin rashes Lymphatics: Denies new lymphadenopathy or easy bruising Neurological:Denies numbness, tingling or new weaknesses Behavioral/Psych: Mood is stable, no new changes  All other systems were reviewed with the patient and are negative.  PHYSICAL EXAMINATION: ECOG PERFORMANCE STATUS: 1 - Symptomatic but completely ambulatory  Vitals:   04/03/19 1546  BP: (!) 161/75  Pulse: 74  Resp: 18  Temp: 98 F (36.7 C)  SpO2: 100%   Filed Weights   04/03/19 1546  Weight: 245 lb 6.4 oz (111.3 kg)    GENERAL:alert, no distress and comfortable SKIN: skin color, texture, turgor are normal, no rashes or significant lesions EYES: normal, conjunctiva are pink and non-injected, sclera clear OROPHARYNX:no exudate, no erythema and lips, buccal mucosa, and tongue normal  NECK: supple, thyroid normal size, non-tender, without nodularity LYMPH:  no palpable lymphadenopathy in the cervical, axillary or inguinal LUNGS: clear to auscultation and percussion with normal breathing effort HEART: regular rate & rhythm and no murmurs and no lower extremity edema ABDOMEN: Epigastric tenderness, no hepatosplenomegaly Musculoskeletal:no cyanosis of digits and no clubbing  PSYCH: alert & oriented x 3 with fluent speech NEURO: no focal motor/sensory deficits  LABORATORY DATA:  I have reviewed the data as listed Lab Results  Component Value Date   WBC 7.9 01/15/2019   HGB 12.3 (L) 01/15/2019   HCT 34.4 (L)  01/15/2019   MCV 91 01/15/2019   PLT 100 (LL) 01/15/2019   Lab Results  Component Value Date   NA 135 11/30/2018   K 3.9 11/30/2018   CL 92 (L) 11/30/2018   CO2 28 11/30/2018    RADIOGRAPHIC STUDIES: I have personally reviewed the radiological reports and agreed with the findings in the report.  ASSESSMENT AND PLAN:  Thrombocytopenia ([place]) Chronic thrombocytopenia Platelet count  08/14/2008: 89 July 2020: 115 01/15/2019: 100  Mild thrombocytopenia: Platelet count 128 on 04/23/2016; 129 on 05/22/2016, 144 in February 2009 Remainder of the CBC with differential was normal, hemoglobin 15.9, WBC 4.8 with normal differential  Differential diagnosis: 1. Low-grade ITP 2. medication induced: Patient does not take any medications that cause thrombocytopenia.  Lipitor can have rare thrombocytopenia but I do not recommend changing  it. 3. Bone marrow factors: Since his white count and hemoglobin are relatively stable I do not think we need to do a bone marrow at this time. 4.Splenomegaly: No enlarged spleen was palpable to physical exam. 5.  Toxins especially alcohol: Patient reports that he drinks maybe once or twice a week.  I discussed with the patient that the level of thrombocytopenia is very mild and that these levels, there are usually no adverse effects. There is usually no risk of bleeding and hence it can be observed without making any changes to patient's medications or requiring any further investigations like bone marrow biopsies.  I recommended watchful monitoring. I instructed the patient to return to our office if his platelet count drops below 50.  Abdominal discomfort: Difficulty swallowing: We will send a referral to [facility] gastroenterology for further evaluation.  All questions were answered. The patient knows to call the clinic with any problems, questions or concerns.    Rulon Eisenmenger, MD, MPH 04/03/2019    I, Molly Dorshimer, am acting as scribe for Nicholas Lose, MD.  I have reviewed the above documentation for accuracy and completeness, and I agree with the above.

## 2019-04-03 ENCOUNTER — Other Ambulatory Visit: Payer: Self-pay

## 2019-04-03 ENCOUNTER — Other Ambulatory Visit: Payer: Self-pay | Admitting: *Deleted

## 2019-04-03 ENCOUNTER — Telehealth: Payer: Self-pay | Admitting: Family Medicine

## 2019-04-03 ENCOUNTER — Inpatient Hospital Stay: Payer: BC Managed Care – PPO | Attending: Hematology and Oncology | Admitting: Hematology and Oncology

## 2019-04-03 DIAGNOSIS — D696 Thrombocytopenia, unspecified: Secondary | ICD-10-CM | POA: Diagnosis not present

## 2019-04-03 DIAGNOSIS — R1013 Epigastric pain: Secondary | ICD-10-CM

## 2019-04-03 NOTE — Progress Notes (Signed)
Per Dr. Lindi Adie request, referral placed to [facility].  Referral placed and faxed to [phone].

## 2019-04-03 NOTE — Assessment & Plan Note (Signed)
Chronic thrombocytopenia Platelet count  08/14/2008: 89 July 2020: 115 01/15/2019: 100  Mild thrombocytopenia: Platelet count 128 on 04/23/2016; 129 on 05/22/2016, 144 in February 2009 Remainder of the CBC with differential was normal, hemoglobin 15.9, WBC 4.8 with normal differential  Differential diagnosis: 1. Low-grade ITP 2. medication induced  3. Bone marrow factors 4. Hepatitis B/C 5. Splenomegaly: No enlarged spleen was palpable to physical exam.  I discussed with the patient that the level of thrombocytopenia is very mild and that these levels, there are usually no adverse effects. There is usually no risk of bleeding and hence it can be observed without making any changes to patient's medications or requiring any further investigations like bone marrow biopsies.  I recommended watchful monitoring. We can see the patient back in 6 months with labs and follow-up.

## 2019-04-03 NOTE — Telephone Encounter (Signed)
Pt will need appt with Dr. Raliegh Scarlet to discuss the need for this medication.  Please call pt to schedule.  Charyl Bigger, CMA

## 2019-04-03 NOTE — Telephone Encounter (Signed)
Patient called about getting a fluid pill under the direction of podiatry, who said to contact PCP office for this (see previous note from 03/28/19). Please place order if appropriate

## 2019-04-04 ENCOUNTER — Telehealth: Payer: Self-pay | Admitting: Family Medicine

## 2019-04-04 ENCOUNTER — Encounter: Payer: Self-pay | Admitting: Gastroenterology

## 2019-04-04 NOTE — Progress Notes (Signed)
Subjective: 53 year old male presents the office today for concerns of right foot swelling and pain.  After I last saw him he was doing well however about 1 month ago he states he fell down a hill on a lawnmower and twisted his ankle and since then he has had swelling. He said no recent treatment for this since I last saw him.  No other injury. Denies any systemic complaints such as fevers, chills, nausea, vomiting. No acute changes since last appointment, and no other complaints at this time.   Objective: AAO x3, NAD DP/PT pulses palpable bilaterally, CRT less than 3 seconds There is edema to the right foot and ankle with minimal warmth there is no erythema.  There is no open lesions identified.  Mild tenderness of the dorsal aspect of the foot as well as the anterior ankle joint.  No area of pinpoint tenderness. No open lesions or pre-ulcerative lesions.  No pain with calf compression, swelling, warmth, erythema  Assessment: Right foot injury, Charcot  Plan: -All treatment options discussed with the patient including all alternatives, risks, complications.  -X-rays obtained reviewed.  There is radiolucency is present on the Lisfranc joint concerning for Charcot, injury.  We had a long discussion regards to treatment options.  I recommended nonweightbearing and immobilization.  I discussed the natural progression of Charcot.  He declined the cast.  He was placed into a cam boot today.  Discussed bracing long-term.  I like to see him back next couple weeks to check on this however he states that the does not want to come back that soon because of cost. I am willing to try to help him with the cost in order to help minimize long-term sequela. -Patient encouraged to call the office with any questions, concerns, change in symptoms.   Trula Slade DPM

## 2019-04-04 NOTE — Telephone Encounter (Signed)
   [name], DPM Podiatry  Conversation (Newest Message First) Jeffrey Ege, RN     03/28/19 10:14 AM Note   I spoke with pt and asked if PCP had someone that took call for him when he was out of the office. Pt states only for emergencies. I told pt that fluid pills were not in the podiatrist formulary, which is why Dr. Jacqualyn Posey had referred to PCP for the medication. I instructed pt to rest, and elevate as much as possible until he could contact the PCP. Pt states understanding.        03/28/19 10:09 AM  Jeffrey Ege, RN contacted [name]       03/28/19 10:01 AM [name] routed this conversation to Jeffrey Ege, RN   [name]     03/28/19 10:01 AM Note   Patient called and stated that Dr. Jacqualyn Posey was suppose to send a note to his pcp for a water pill. His pcp is not in the office and wanted to know what to do next.         Per states that Per Dr. Jacqualyn Posey he is to start taking fluid pill for fluid retention. He was told by Dr. Jacqualyn Posey office to contact PCP for medication prescription and management. Patient called our office yesterday and was told he has to have a visit for medication to be prescribed. Your next available apt is not until 04/18/19. Patient is very frustrated and feels like he is being pushed between our office and podiatry.  Above I have included the message  Please advise. AS, CMA

## 2019-04-04 NOTE — Telephone Encounter (Signed)
Patient has been advised of the below and the call was transferred to front desk for scheduling. AS, CMA

## 2019-04-04 NOTE — Telephone Encounter (Signed)
Let patient know that this is a brand-new medicine for him and I do not just refill a medicine that a another healthcare person feels he may or may not need.    I will need to assess him for this and see if this is appropriate and as well, he will need recent labs if they were not done in the recent future.  Hence, yes he will absolutely need an office visit to discuss.  Preferably in person so that I can see his peripheral edema and everything in person

## 2019-04-04 NOTE — Telephone Encounter (Signed)
Pt called upset because he says we sent him to [facility] (Dr. Jacqualyn Posey who treated him says PCP needs to write Rx for Fluid Pill ) didn't understand why he is getting the run around for a Rx for a simple fluid pill--Pt ask why an OV/ Telehealth necessary for Rx to be written/ordered. (advised that a discussion needed between him & Dr. Jenetta Downer as to the reason for need of Fluid pill--- Patient states he is retaining FLUID IN FEET! Marland Kitchen  --scheduled appt for 12/16 ( (provider's 1st available)-  ---Please call patient if there are any questions @ [phone].    --glh

## 2019-04-05 ENCOUNTER — Ambulatory Visit (INDEPENDENT_AMBULATORY_CARE_PROVIDER_SITE_OTHER): Payer: BC Managed Care – PPO | Admitting: Family Medicine

## 2019-04-05 ENCOUNTER — Encounter: Payer: Self-pay | Admitting: Family Medicine

## 2019-04-05 ENCOUNTER — Other Ambulatory Visit: Payer: Self-pay

## 2019-04-05 VITALS — BP 138/80 | HR 84 | Temp 98.6°F | Resp 12 | Ht 71.0 in | Wt 249.3 lb

## 2019-04-05 DIAGNOSIS — E1159 Type 2 diabetes mellitus with other circulatory complications: Secondary | ICD-10-CM

## 2019-04-05 DIAGNOSIS — E1169 Type 2 diabetes mellitus with other specified complication: Secondary | ICD-10-CM

## 2019-04-05 DIAGNOSIS — E1140 Type 2 diabetes mellitus with diabetic neuropathy, unspecified: Secondary | ICD-10-CM | POA: Diagnosis not present

## 2019-04-05 DIAGNOSIS — I739 Peripheral vascular disease, unspecified: Secondary | ICD-10-CM

## 2019-04-05 DIAGNOSIS — Z9119 Patient's noncompliance with other medical treatment and regimen: Secondary | ICD-10-CM

## 2019-04-05 DIAGNOSIS — M7989 Other specified soft tissue disorders: Secondary | ICD-10-CM | POA: Diagnosis not present

## 2019-04-05 DIAGNOSIS — Z91199 Patient's noncompliance with other medical treatment and regimen due to unspecified reason: Secondary | ICD-10-CM

## 2019-04-05 DIAGNOSIS — Z1211 Encounter for screening for malignant neoplasm of colon: Secondary | ICD-10-CM | POA: Diagnosis not present

## 2019-04-05 DIAGNOSIS — I10 Essential (primary) hypertension: Secondary | ICD-10-CM

## 2019-04-05 DIAGNOSIS — D696 Thrombocytopenia, unspecified: Secondary | ICD-10-CM

## 2019-04-05 DIAGNOSIS — I152 Hypertension secondary to endocrine disorders: Secondary | ICD-10-CM

## 2019-04-05 DIAGNOSIS — E1142 Type 2 diabetes mellitus with diabetic polyneuropathy: Secondary | ICD-10-CM

## 2019-04-05 DIAGNOSIS — E782 Mixed hyperlipidemia: Secondary | ICD-10-CM

## 2019-04-05 MED ORDER — FUROSEMIDE 20 MG PO TABS: 20.0000 mg | ORAL_TABLET | Freq: Every day | ORAL | 3 refills | Status: DC

## 2019-04-05 NOTE — Progress Notes (Signed)
Impression and Recommendations:    1. Swelling of both lower extremities   2. Diabetic neuropathy, painful ([place])   3. Screening for colon cancer   4. Hypertension associated with diabetes ([place])   5. Mixed diabetic hyperlipidemia associated with type 2 diabetes mellitus ([place])   6. Diabetic peripheral neuropathy associated with type 2 diabetes mellitus ([place])   7. History of noncompliance with medical treatment   8. Thrombocytopenia ([place])   9. Peripheral arterial disease ([place])     - Need for colon cancer screening.  Told patient to follow-up with GI for colonoscopy.  Swelling of Bilateral LE, Bilateral Pitting Edema - Reviewed findings of Podiatry with patient today.  -  X-ray was obtained per podiatry recently and I reviewed results with pt as well as recent MRI results done May 2020 as well- interpreting results for pt and all Q's answered  - per podiatry (03/27/2019): "there is radiolucency is present on the Lisfranc joint concerning for Charcot, injury.  We had a long discussion regards to treatment options.  I recommended nonweightbearing and immobilization.  I discussed the natural progression of Charcot.  He declined the cast.  He was placed into a cam boot today.  Discussed bracing long-term.  I like to see him back next couple weeks to check on this however he states that the does not want to come back that soon because of cost. I am willing to try to help him with the cost in order to help minimize long-term sequela."  - Told patient I would reach out to podiatry regarding recommendations and that podiatry would be in touch with patient if need be. - Discussed need for patient to immobilize foot and avoid weight bearing.  - Reviewed indications for beginning lasix today. - All treatment options discussed with the patient, including alternatives, risks, and complications. - Extensive education provided and all questions answered.  - Explained need to monitor kidneys  closely while managed on lasix. - Discussed that electrolytes can be depleted while on lasix management.  - Patient agrees to begin lasix today.  See med list. - Told patient to monitor blood pressure closely at home.  - Discussed further options for imaging with patient today. - Patient's last MRI of the foot was obtained 09/11/2018, due to ulceration of the R foot.  - Given patient's symptoms, echocardiogram ordered today.  See orders.  - To help reduce swelling in the LE, told patient to reduce salt intake. - Told patient to drink adequate water during the day. - Emphasized importance of avoiding sedentary lifestyle/sitting for long periods of time. - Discussed critical importance of regular walking and physical activity. - Told patient to elevate his feet above the level of the heart while resting.  - Will continue to monitor.  Kidney Health - Reviewed kidney health in detail today.  Education provided and all questions answered. - Discussed that patient's most recent serum creatinine was measured at 1.16, with a goal value of under 1.0. - To help preserve kidney health, told patient to avoid nephrotoxic substances. - Emphasized critical importance of hydration and physical activity to preserve organ health. - Told patient to take tylenol instead of ibuprofen for pain management.  - Will continue to monitor and re-check as recommended.  Diabetic Neuropathy - Per patient, increased dose of gabapentin providing increased relief. - Will continue to monitor.  Recommendations - Return in 4 weeks for BMP with GFR. - F/up in 1.5 months for chronic diabetes management. -  Will monitor patient's progress on lasix and increase in gabapentin.    Orders Placed This Encounter  Procedures  . Ambulatory referral to Gastroenterology  . ECHOCARDIOGRAM COMPLETE    Meds ordered this encounter  Medications  . furosemide (LASIX) 20 MG tablet    Sig: Take 1 tablet (20 mg total) by mouth  daily.    Dispense:  30 tablet    Refill:  3    Medications Discontinued During This Encounter  Medication Reason  . blood glucose meter kit and supplies Error  . cilostazol (PLETAL) 50 MG tablet Error  . folic acid (FOLVITE) 1 MG tablet Error     Gross side effects, risk and benefits, and alternatives of medications and treatment plan in general discussed with patient.  Patient is aware that all medications have potential side effects and we are unable to predict every side effect or drug-drug interaction that may occur.   Patient will call with any questions prior to using medication if they have concerns.    Expresses verbal understanding and consents to current therapy and treatment regimen.  No barriers to understanding were identified.  Red flag symptoms and signs discussed in detail.  Patient expressed understanding regarding what to do in case of emergency\urgent symptoms  Please see AVS handed out to patient at the end of our visit for further patient instructions/ counseling done pertaining to today's office visit.   Return in about 4 weeks (around 05/03/2019), or needs BMP with GFR lab only, for keep appt end of Jan-> for starting lasix, f/up echo and see how inc gabapentin dose is doing..     Note:  This note was prepared with assistance of Dragon voice recognition software. Occasional wrong-word or sound-a-like substitutions may have occurred due to the inherent limitations of voice recognition software.   This document serves as a record of services personally performed by Mellody Dance, DO. It was created on her behalf by [name], a trained medical scribe. The creation of this record is based on the scribe's personal observations and the provider's statements to them.   This case required medical decision making of at least moderate complexity. The above documentation has been reviewed to be accurate and was completed by Marjory Sneddon, D.O.       --------------------------------------------------------------------------------------------------------------------------------------------------------------------------------------------------------------------------------------------    Subjective:   Phillips Odor, am serving as scribe for Dr. Mellody Dance.  Per patient's history of thrombocytopenia.  All recent notes were reviewed from his hematology oncology office visit with Dr. Payton Mccallum.  Explained to patient what the assessment and plan was.  Again, all questions were answered for patient regarding what Dr. Aris Lot plan was and future care etc.  HPI: FRASER BUSCHE is a 53 y.o. male who presents to [place] [facility] today for issues as discussed below.  - Diabetic Neuropathy Patient notes that his increased dose of gabapentin is helping with his pain.  - Heartburn Notes he has heartburn sometimes; his symptoms come and go.  Says if he drinks alcohol, the symptoms come on worse.  He thinks it "has to do with a hernia."  States he's a social drinker; "I don't like drinking just to get drunk."  He is following up with GI.  - Foot Concerns, Swelling of Ankle Patient recently went to podiatrist; was found to have swelling of the ankles and was told to speak to PCP about beginning lasix.  Notes he was given a boot to "keep the weight off  of it due to the swelling."  In addition, states that one of his feet is "broken."  Says that he has been experiencing swelling in the ankles for about a year, particularly after he had an accident sliding down a hill while pushing a lawnmower.  Denies lower extremity swelling in the past.  Thinks he may have been moving less lately.  Denies concerns lying flat at night.  Denies concerns breathing.  Denies chest pain or new shortness of breath with activity.  Denies eating more salt than usual.  Says "when water's in my mind, I drink at least 5-6 water bottles."  Says when  "water isn't on his mind," he doesn't drink a lot.  Thinks he drinks more water more often than not.  He sleeps on one pillow at night.  Recent HPI (03/27/2019) from Dr. Jacqualyn Posey of Podiatry: 53 year old male presents the office today for concerns of right foot swelling and pain.  After I last saw him he was doing well however about 1 month ago he states he fell down a hill on a lawnmower and twisted his ankle and since then he has had swelling. He said no recent treatment for this since I last saw him.  No other injury. Denies any systemic complaints such as fevers, chills, nausea, vomiting. No acute changes since last appointment, and no other complaints at this time.    Wt Readings from Last 3 Encounters:  04/05/19 249 lb 4.8 oz (113.1 kg)  04/03/19 245 lb 6.4 oz (111.3 kg)  02/26/19 234 lb 8 oz (106.4 kg)   BP Readings from Last 3 Encounters:  04/05/19 138/80  04/03/19 (!) 161/75  02/26/19 105/67   Pulse Readings from Last 3 Encounters:  04/05/19 84  04/03/19 74  02/26/19 (!) 112   BMI Readings from Last 3 Encounters:  04/05/19 34.77 kg/m  04/03/19 34.23 kg/m  02/26/19 32.71 kg/m     Patient Care Team    Relationship Specialty Notifications Start End  Mellody Dance, DO PCP - General Family Medicine  07/28/17   Minette Brine, [place]  General Practice  07/29/17      Patient Active Problem List   Diagnosis Date Noted  . Mixed diabetic hyperlipidemia associated with type 2 diabetes mellitus ([place]) 11/16/2017    Priority: High  . Hypertension associated with diabetes ([place]) 07/28/2017    Priority: High  . Diabetes mellitus ([place]) 07/28/2017    Priority: High  . Diabetic neuropathy, painful ([place]) 06/22/2012    Priority: High  . Diabetic peripheral neuropathy associated with type 2 diabetes mellitus ([place]) 11/16/2017    Priority: Medium  . Morbidly obese ([place]) 07/28/2017    Priority: Medium  . Vitamin D deficiency 07/28/2017    Priority: Low  . History of noncompliance  with medical treatment 07/28/2017    Priority: Low  . Thrombocytopenia ([place]) 04/03/2019  . Peripheral arterial disease ([place]) 02/15/2019  . Hyperlipidemia 02/15/2019  . Persistent proteinuria 11/14/2018  . Low TSH level 11/14/2018  . High risk medications (not anticoagulants) long-term use 11/14/2018  . Noncompliance with diet and medication regimen 11/14/2018  . Hiatal hernia 11/14/2018  . Anorexia 11/14/2018  . 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 Healthcare maintenance 07/11/2018  . Essential hypertension, benign 06/22/2012  . Pancreatitis, acute 06/22/2012  . Nausea with vomiting 06/22/2012  . Abdominal pain, epigastric 06/22/2012  . Acute hyperkalemia 06/22/2012  . Leukocytosis, unspecified 06/22/2012  Past Medical history, Surgical history, Family history, Social history, Allergies and Medications have been entered into the medical record, reviewed and changed as needed.    Current Meds  Medication Sig  . atorvastatin (LIPITOR) 20 MG tablet Take 1 tablet (20 mg total) by mouth at bedtime.  . cyclobenzaprine (FLEXERIL) 10 MG tablet Take 1 tablet (10 mg total) by mouth 3 (three) times daily as needed for muscle spasms.  Marland Kitchen gabapentin (NEURONTIN) 600 MG tablet TAKE 1 TABLET BY MOUTH THREE TIMES DAILY  . metFORMIN (GLUCOPHAGE) 500 MG tablet Take 1 tablet (500 mg total) by mouth 2 (two) times daily with a meal. & only 1/2 tab BID on days you don't eat much  . Olmesartan-amLODIPine-HCTZ 20-5-12.5 MG TABS Take 1 tablet by mouth daily.  . Vitamin D, Ergocalciferol, (DRISDOL) 1.25 MG (50000 UT) CAPS capsule TAKE 1 CAPSULE BY MOUTH TWO TIMES A WEEK.  WEDNESDAY AND SUNDAY    Allergies:  No Known Allergies   Review of Systems:  A fourteen system review of systems was performed and found to be positive as per HPI.   Objective:   Blood pressure 138/80, pulse 84, temperature 98.6  F (37 C), temperature source Oral, resp. rate 12, height '5\' 11"'  (1.803 m), weight 249 lb 4.8 oz (113.1 kg), SpO2 100 %. Body mass index is 34.77 kg/m. General:  Well Developed, well nourished, appropriate for stated age.  Neuro:  Alert and oriented,  extra-ocular muscles intact  HEENT:  Normocephalic, atraumatic, neck supple, no carotid bruits appreciated  Skin:  no gross rash, warm, pink. Cardiac:  RRR, S1 S2 Respiratory:  ECTA B/L and A/P, Not using accessory muscles, speaking in full sentences- unlabored. Vascular:  Ext warm, no cyanosis apprec.; cap RF less 2 sec. Psych:  No HI/SI, judgement and insight good, Euthymic mood. Full Affect. Bilateral LE:   Patient with pitting edema up to proximal tibial area bilaterally

## 2019-04-06 ENCOUNTER — Other Ambulatory Visit: Payer: Self-pay | Admitting: Podiatry

## 2019-04-06 ENCOUNTER — Telehealth: Payer: Self-pay | Admitting: *Deleted

## 2019-04-06 DIAGNOSIS — M779 Enthesopathy, unspecified: Secondary | ICD-10-CM

## 2019-04-06 DIAGNOSIS — M14671 Charcot's joint, right ankle and foot: Secondary | ICD-10-CM

## 2019-04-06 DIAGNOSIS — M79671 Pain in right foot: Secondary | ICD-10-CM

## 2019-04-06 DIAGNOSIS — T1490XA Injury, unspecified, initial encounter: Secondary | ICD-10-CM

## 2019-04-06 DIAGNOSIS — S99921A Unspecified injury of right foot, initial encounter: Secondary | ICD-10-CM

## 2019-04-06 DIAGNOSIS — Z872 Personal history of diseases of the skin and subcutaneous tissue: Secondary | ICD-10-CM

## 2019-04-06 NOTE — Telephone Encounter (Signed)
Faxed and emailed orders, clinicals and required form to [facility].

## 2019-04-06 NOTE — Telephone Encounter (Signed)
-----   Message from Trula Slade, DPM sent at 04/05/2019  6:16 PM EST ----- Can we try to get him a knee scooter? If he cannot get that can you set him a prescription for crutches to stay off on his right foot?

## 2019-04-10 NOTE — Progress Notes (Signed)
Patient came in today to pick up standard Afo brace.  Patient was evaluated for fit and function.   The brace fit very well and there were any complaints of the way it felt once donned.  The brace offered ankle stability in both saggital and coroneal planes.  Patient advised to always wear proper fitting shoes with brace. 

## 2019-04-13 ENCOUNTER — Other Ambulatory Visit (HOSPITAL_COMMUNITY): Payer: BC Managed Care – PPO

## 2019-04-18 ENCOUNTER — Ambulatory Visit: Payer: BC Managed Care – PPO | Admitting: Family Medicine

## 2019-04-19 ENCOUNTER — Encounter (HOSPITAL_COMMUNITY): Payer: Self-pay | Admitting: Family Medicine

## 2019-04-24 ENCOUNTER — Ambulatory Visit: Payer: BC Managed Care – PPO | Admitting: Podiatry

## 2019-04-25 ENCOUNTER — Other Ambulatory Visit: Payer: Self-pay | Admitting: Family Medicine

## 2019-04-25 DIAGNOSIS — E1140 Type 2 diabetes mellitus with diabetic neuropathy, unspecified: Secondary | ICD-10-CM

## 2019-04-26 ENCOUNTER — Telehealth: Payer: Self-pay | Admitting: Sports Medicine

## 2019-04-26 DIAGNOSIS — E1140 Type 2 diabetes mellitus with diabetic neuropathy, unspecified: Secondary | ICD-10-CM

## 2019-04-26 MED ORDER — GABAPENTIN 600 MG PO TABS: 1200.0000 mg | ORAL_TABLET | Freq: Three times a day (TID) | ORAL | 0 refills | Status: DC

## 2019-04-26 NOTE — Telephone Encounter (Signed)
[name] called the after hours line requesting a refill on his gabapentin.

## 2019-04-30 NOTE — Telephone Encounter (Signed)
That is ridiculous.  Refills should not be a reason why people are calling the after-hours emergency on-call service.  In the future I would not be mad if you declined that, that was his fault not ours.   However, thank you for refilling it but you did not have to.

## 2019-04-30 NOTE — Telephone Encounter (Signed)
No problem, I was happy to do it.

## 2019-05-01 ENCOUNTER — Telehealth (HOSPITAL_COMMUNITY): Payer: Self-pay | Admitting: Radiology

## 2019-05-01 NOTE — Telephone Encounter (Signed)
That is a shame. Thank you for letting us know

## 2019-05-01 NOTE — Telephone Encounter (Signed)
Just an FYI. We have made several attempts to contact this patient including sending a letter to schedule or reschedule their echocardiogram. We will be removing the patient from the echo [place].  04/24/19 @ 11:48 am called and spoke with patient. He will call back-concerned with co-pay evd  12.17.20 mail reminder letter [facility]  12.14.20 @ 2:26pm call and spoke with patient - talk with billing dept first before making an appt [facility]  12.11.20 no show   Thank you

## 2019-05-02 ENCOUNTER — Other Ambulatory Visit: Payer: Self-pay

## 2019-05-02 DIAGNOSIS — I10 Essential (primary) hypertension: Secondary | ICD-10-CM

## 2019-05-03 ENCOUNTER — Other Ambulatory Visit: Payer: BC Managed Care – PPO

## 2019-05-11 ENCOUNTER — Ambulatory Visit (INDEPENDENT_AMBULATORY_CARE_PROVIDER_SITE_OTHER): Payer: BC Managed Care – PPO | Admitting: Gastroenterology

## 2019-05-11 ENCOUNTER — Encounter: Payer: Self-pay | Admitting: Gastroenterology

## 2019-05-11 ENCOUNTER — Other Ambulatory Visit: Payer: Self-pay

## 2019-05-11 ENCOUNTER — Other Ambulatory Visit (INDEPENDENT_AMBULATORY_CARE_PROVIDER_SITE_OTHER): Payer: BC Managed Care – PPO

## 2019-05-11 VITALS — BP 148/78 | HR 88 | Temp 98.1°F | Ht 71.0 in | Wt 239.0 lb

## 2019-05-11 DIAGNOSIS — R112 Nausea with vomiting, unspecified: Secondary | ICD-10-CM

## 2019-05-11 DIAGNOSIS — K5900 Constipation, unspecified: Secondary | ICD-10-CM | POA: Diagnosis not present

## 2019-05-11 DIAGNOSIS — Z1211 Encounter for screening for malignant neoplasm of colon: Secondary | ICD-10-CM

## 2019-05-11 DIAGNOSIS — K7460 Unspecified cirrhosis of liver: Secondary | ICD-10-CM | POA: Diagnosis not present

## 2019-05-11 DIAGNOSIS — Z01818 Encounter for other preprocedural examination: Secondary | ICD-10-CM

## 2019-05-11 DIAGNOSIS — R630 Anorexia: Secondary | ICD-10-CM

## 2019-05-11 LAB — COMPREHENSIVE METABOLIC PANEL WITH GFR
ALT: 28 U/L (ref 0–53)
AST: 23 U/L (ref 0–37)
Albumin: 3.3 g/dL — ABNORMAL LOW (ref 3.5–5.2)
Alkaline Phosphatase: 169 U/L — ABNORMAL HIGH (ref 39–117)
BUN: 3 mg/dL — ABNORMAL LOW (ref 6–23)
CO2: 30 meq/L (ref 19–32)
Calcium: 9 mg/dL (ref 8.4–10.5)
Chloride: 97 meq/L (ref 96–112)
Creatinine, Ser: 0.94 mg/dL (ref 0.40–1.50)
GFR: 102.34 mL/min
Glucose, Bld: 185 mg/dL — ABNORMAL HIGH (ref 70–99)
Potassium: 3.4 meq/L — ABNORMAL LOW (ref 3.5–5.1)
Sodium: 132 meq/L — ABNORMAL LOW (ref 135–145)
Total Bilirubin: 1 mg/dL (ref 0.2–1.2)
Total Protein: 7.6 g/dL (ref 6.0–8.3)

## 2019-05-11 LAB — CBC WITH DIFFERENTIAL/PLATELET
Basophils Absolute: 0 K/uL (ref 0.0–0.1)
Basophils Relative: 0.6 % (ref 0.0–3.0)
Eosinophils Absolute: 0.4 K/uL (ref 0.0–0.7)
Eosinophils Relative: 5.6 % — ABNORMAL HIGH (ref 0.0–5.0)
HCT: 35.5 % — ABNORMAL LOW (ref 39.0–52.0)
Hemoglobin: 12.4 g/dL — ABNORMAL LOW (ref 13.0–17.0)
Lymphocytes Relative: 15.4 % (ref 12.0–46.0)
Lymphs Abs: 1.1 K/uL (ref 0.7–4.0)
MCHC: 34.9 g/dL (ref 30.0–36.0)
MCV: 92.8 fl (ref 78.0–100.0)
Monocytes Absolute: 0.5 K/uL (ref 0.1–1.0)
Monocytes Relative: 6.6 % (ref 3.0–12.0)
Neutro Abs: 5.2 K/uL (ref 1.4–7.7)
Neutrophils Relative %: 71.8 % (ref 43.0–77.0)
Platelets: 92 K/uL — ABNORMAL LOW (ref 150.0–400.0)
RBC: 3.83 Mil/uL — ABNORMAL LOW (ref 4.22–5.81)
RDW: 12.8 % (ref 11.5–15.5)
WBC: 7.2 K/uL (ref 4.0–10.5)

## 2019-05-11 LAB — PROTIME-INR
INR: 1.2 ratio — ABNORMAL HIGH (ref 0.8–1.0)
Prothrombin Time: 13.5 s — ABNORMAL HIGH (ref 9.6–13.1)

## 2019-05-11 MED ORDER — ONDANSETRON 4 MG PO TBDP: 4.0000 mg | ORAL_TABLET | Freq: Four times a day (QID) | ORAL | 1 refills | Status: DC | PRN

## 2019-05-11 MED ORDER — OMEPRAZOLE 40 MG PO CPDR: 40.0000 mg | DELAYED_RELEASE_CAPSULE | Freq: Every day | ORAL | 3 refills | Status: DC

## 2019-05-11 MED ORDER — SUTAB 1479-225-188 MG PO TABS: 1.0000 | ORAL_TABLET | Freq: Once | ORAL | 0 refills | Status: AC

## 2019-05-11 NOTE — Progress Notes (Signed)
HPI :  54 year old male with a history of diabetes, hypertension, thrombocytopenia, referred here by Mellody Dance DO for multiple digestive complaints  Patient states has had problems eating for a "long time".  Over time things have been getting worse.  He has frequent nausea after he eats, sometimes after he eats he feels his head gets sweaty and wet.  He vomits occasionally after his meals, usually 2 to 3 days/week he will have vomiting after eating.  He feels bloated and somewhat distended after he eats and also sporadically throughout the day.  He has some early satiety with this but denies any overt pain or discomfort when he eats.  He has some reflux which bothers him at times, he states this is rather mild than usually at night.  He takes Pepto-Bismol for this as needed.  He endorses a globus sensation in his throat but denies any dysphagia with eating.  He has some constipation at baseline.  He attributes this to not drinking enough water during the day.  He denies any blood in his stools.  He denies any family history of colon cancer, esophageal cancer, gastric cancer.  His father had alcoholic related liver disease.  Of note on reviewing his chart he had CT scan of the abdomen pelvis in February 2014 showing cirrhosis with portosystemic shunting and splenomegaly, as well as an ultrasound in November 2015 showing a cirrhotic appearing liver.  The patient states he was told he "may have had" cirrhosis at some point years ago, but denies any follow-up for this.  He is unaware he has any liver disease.  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  He denies any jaundice, no ascites, no history of variceal bleeding or hepatic encephalopathy.  He drinks alcohol only very rarely, denies any routine use of alcohol historically in light of watching what his father went through.  Liver enzymes are remarkable for an elevated alkaline phosphatase and  normal AST and ALT as well as total bilirubin done in July 2020  The patient has never had a prior endoscopy or colonoscopy.   [place] 04/01/2014 - cirrhosis?  CT scan 06/22/2012 - cirrhosis with splenomegaly   Past Medical History:  Diagnosis Date  . Diabetes mellitus without complication ([place])   . Hypertension   . Thrombocytopenia ([place])      Past Surgical History:  Procedure Laterality Date  . APPENDECTOMY    . BACK SURGERY     Family History  Problem Relation Age of Onset  . Brain cancer Mother   . Diabetes Father   . Colon cancer Neg Hx   . Liver cancer Neg Hx    Social History   Tobacco Use  . Smoking status: Former Smoker    Quit date: 06/23/1995    Years since quitting: 23.8  . Smokeless tobacco: Current User    Types: Chew  Substance Use Topics  . Alcohol use: Yes    Comment: 3 drinks a month  . Drug use: No   Current Outpatient Medications  Medication Sig Dispense Refill  . atorvastatin (LIPITOR) 20 MG tablet Take 1 tablet (20 mg total) by mouth at bedtime. 90 tablet 0  . cyclobenzaprine (FLEXERIL) 10 MG tablet Take 1 tablet (10 mg total) by mouth 3 (three) times daily as needed for muscle spasms. 30 tablet 0  . gabapentin (NEURONTIN) 600 MG tablet Take 2 tablets (1,200 mg total) by mouth 3 (three) times daily. Max 3,600 mg per day  540 tablet 0  . metFORMIN (GLUCOPHAGE) 500 MG tablet Take 1 tablet (500 mg total) by mouth 2 (two) times daily with a meal. & only 1/2 tab BID on days you don't eat much 60 tablet 2  . Olmesartan-amLODIPine-HCTZ 20-5-12.5 MG TABS Take by mouth daily.    . Vitamin D, Ergocalciferol, (DRISDOL) 1.25 MG (50000 UT) CAPS capsule TAKE 1 CAPSULE BY MOUTH TWO TIMES A WEEK.  [facility] AND SUNDAY 24 capsule 3  . omeprazole (PRILOSEC) 40 MG capsule Take 1 capsule (40 mg total) by mouth daily. 30 capsule 3  . ondansetron (ZOFRAN ODT) 4 MG disintegrating tablet Take 1 tablet (4 mg total) by mouth every 6 (six) hours as needed for nausea or vomiting.  30 tablet 1  . Sodium Sulfate-Mag Sulfate-KCl (SUTAB) [phone] MG TABS Take 1 kit by mouth once for 1 dose. Lot [id number], Exp: 07-2020 24 tablet 0   No current facility-administered medications for this visit.   No Known Allergies   Review of Systems: All systems reviewed and negative except where noted in HPI.   Lab Results  Component Value Date   WBC 7.9 01/15/2019   HGB 12.3 (L) 01/15/2019   HCT 34.4 (L) 01/15/2019   MCV 91 01/15/2019   PLT 100 (LL) 01/15/2019    Lab Results  Component Value Date   CREATININE 1.16 11/30/2018   BUN 9 11/30/2018   NA 135 11/30/2018   K 3.9 11/30/2018   CL 92 (L) 11/30/2018   CO2 28 11/30/2018    Lab Results  Component Value Date   ALT 19 11/30/2018   AST 26 11/30/2018   ALKPHOS 159 (H) 11/30/2018   BILITOT 0.7 11/30/2018    Lab Results  Component Value Date   INR 1.2 08/09/2008     Physical Exam: BP (!) 148/78   Pulse 88   Temp 98.1 F (36.7 C)   Ht '5\' 11"'  (1.803 m)   Wt 239 lb (108.4 kg)   BMI 33.33 kg/m  Constitutional: Pleasant,well-developed, male in no acute distress. HEENT: Normocephalic and atraumatic. Conjunctivae are normal. No scleral icterus. Neck supple.  Cardiovascular: Normal rate, regular rhythm.  Pulmonary/chest: Effort normal and breath sounds normal. No wheezing, rales or rhonchi. Abdominal: Soft, protuberant, nontender. distasis recti There are no masses palpable.  Extremities: no edema Lymphadenopathy: No cervical adenopathy noted. Neurological: Alert and oriented to person place and time. Skin: Skin is warm and dry. No rashes noted. Psychiatric: Normal mood and affect. Behavior is normal.   ASSESSMENT AND PLAN: 54 year old male here for new patient consultation regarding the following:  Nausea and vomiting / decreased appetite - longstanding nausea with intermittent vomiting, decreased appetite and postprandial upper abdominal fullness with occasional early satiety.  I discussed  differential diagnosis with him.  He does have underlying diabetes, gastroparesis is possible, however he warrants an upper endoscopy to ensure no gastric outlet obstruction, peptic ulcer disease, H. pylori, etc.  I discussed risks and benefits of upper endoscopy with him and he wanted to proceed.  In light of his some of his mild reflux symptoms and these complaints I will start him empirically on omeprazole 40 mg once a day, and also provided some Zofran to use as needed for the nausea.  Hopefully this will help him feel better until we can get his endoscopy done.  If endoscopy is completely normal we may consider gastric emptying study.  Cirrhosis - I reviewed the prior imaging studies the patient had in 2014 and 2015, he has  evidence of cirrhosis of the liver with associated splenomegaly on both studies, I suspect this is causing his underlying thrombocytopenia.  His liver enzymes are mostly normal with exception of elevated alkaline phosphatase.  I discussed differential diagnosis for his liver disease, he does not drink any alcohol.  I am recommending that we perform a full serologic work-up to identify any underlying chronic liver disease.  I also recommend a right upper quadrant ultrasound to assess his liver for interval changes, and rule out [place].  We had a lengthy discussion about cirrhosis in general, he appears compensated at this time, we discussed risks of decompensation and risk for [facility] moving forward.  Given this diagnosis he warrants upper endoscopy to screen for esophageal varices, which will be scheduled as above.  I will need to see him every 6 months moving forward for this pending follow-up imaging and lab support this diagnosis.  He verbalized understanding and agreed with the plan.  He should avoid alcohol moving forward given this issue  Constipation / colon cancer screening - recommend he take MiraLAX daily for treatment of constipation.  I otherwise I am recommending a colonoscopy for  colon cancer screening as he is never had a prior exam.  We had a free sample for bowel prep that was provided to him.  This will be done at the same time as his upper endoscopy as outlined.  He agreed  Total time spent 1 hour reviewing the chart, time spent with patient, time coordinating care, and time documenting this encounter.   Bixby Cellar, MD [facility]  CC: Mellody Dance, DO

## 2019-05-11 NOTE — Patient Instructions (Addendum)
If you are age 52 or older, your body mass index should be between 23-30. Your Body mass index is 33.33 kg/m. If this is out of the aforementioned range listed, please consider follow up with your Primary Care Provider.  If you are age 15 or younger, your body mass index should be between 19-25. Your Body mass index is 33.33 kg/m. If this is out of the aformentioned range listed, please consider follow up with your Primary Care Provider.   You have been scheduled for an endoscopy and colonoscopy. Please follow the written instructions given to you at your visit today. Please pick up your prep supplies at the pharmacy within the next 1-3 days. If you use inhalers (even only as needed), please bring them with you on the day of your procedure.   Please go to the lab in the basement of our building to have lab work done as you leave today. Hit "B" for basement when you get on the elevator.  When the doors open the lab is on your left.  We will call you with the results. Thank you.   You have been scheduled for an abdominal ultrasound at [facility] (1st floor of hospital) on Wednesday, 05-16-19 at 10:30am. Please arrive 15 minutes prior to your appointment for registration. Make certain not to have anything to eat or drink 6 hours prior to your appointment. Should you need to reschedule your appointment, please contact radiology at [phone]. This test typically takes about 30 minutes to perform.   We have sent the following medications to your pharmacy for you to pick up at your convenience: Omeprazole 40mg : Take once a day Zofran 4mg  ODT: take every 6 hours as needed for nausea  Please purchase the following medications over the counter and take as directed: Miralax, take as directed (we have given you a coupon to use)  We are giving you a sample of SuTab today for your colonoscopy prep.  Thank you for entrusting me with your care and for choosing [facility], Dr. Derry Cellar

## 2019-05-15 ENCOUNTER — Ambulatory Visit (INDEPENDENT_AMBULATORY_CARE_PROVIDER_SITE_OTHER): Payer: BC Managed Care – PPO

## 2019-05-15 ENCOUNTER — Encounter: Payer: Self-pay | Admitting: Gastroenterology

## 2019-05-15 DIAGNOSIS — Z1159 Encounter for screening for other viral diseases: Secondary | ICD-10-CM

## 2019-05-16 ENCOUNTER — Other Ambulatory Visit: Payer: Self-pay

## 2019-05-16 ENCOUNTER — Ambulatory Visit (HOSPITAL_COMMUNITY)
Admission: RE | Admit: 2019-05-16 | Discharge: 2019-05-16 | Disposition: A | Discharge status: Home / Self Care | Payer: BC Managed Care – PPO | Source: Ambulatory Visit | Attending: Gastroenterology | Admitting: Gastroenterology

## 2019-05-16 DIAGNOSIS — R112 Nausea with vomiting, unspecified: Secondary | ICD-10-CM | POA: Diagnosis present

## 2019-05-16 DIAGNOSIS — K5900 Constipation, unspecified: Secondary | ICD-10-CM | POA: Insufficient documentation

## 2019-05-16 DIAGNOSIS — R630 Anorexia: Secondary | ICD-10-CM | POA: Diagnosis present

## 2019-05-16 DIAGNOSIS — K7460 Unspecified cirrhosis of liver: Secondary | ICD-10-CM

## 2019-05-16 LAB — SARS CORONAVIRUS 2 (TAT 6-24 HRS): SARS Coronavirus 2: NEGATIVE

## 2019-05-17 ENCOUNTER — Encounter: Payer: Self-pay | Admitting: Gastroenterology

## 2019-05-17 ENCOUNTER — Ambulatory Visit (AMBULATORY_SURGERY_CENTER): Payer: BC Managed Care – PPO | Admitting: Gastroenterology

## 2019-05-17 VITALS — BP 127/77 | HR 72 | Temp 98.1°F | Resp 12 | Ht 71.0 in | Wt 239.0 lb

## 2019-05-17 DIAGNOSIS — K3189 Other diseases of stomach and duodenum: Secondary | ICD-10-CM

## 2019-05-17 DIAGNOSIS — D126 Benign neoplasm of colon, unspecified: Secondary | ICD-10-CM

## 2019-05-17 DIAGNOSIS — R6881 Early satiety: Secondary | ICD-10-CM | POA: Diagnosis not present

## 2019-05-17 DIAGNOSIS — R112 Nausea with vomiting, unspecified: Secondary | ICD-10-CM

## 2019-05-17 DIAGNOSIS — K766 Portal hypertension: Secondary | ICD-10-CM

## 2019-05-17 DIAGNOSIS — B371 Pulmonary candidiasis: Secondary | ICD-10-CM

## 2019-05-17 DIAGNOSIS — K7460 Unspecified cirrhosis of liver: Secondary | ICD-10-CM | POA: Diagnosis not present

## 2019-05-17 DIAGNOSIS — Z1211 Encounter for screening for malignant neoplasm of colon: Secondary | ICD-10-CM

## 2019-05-17 DIAGNOSIS — B3781 Candidal esophagitis: Secondary | ICD-10-CM

## 2019-05-17 MED ORDER — SODIUM CHLORIDE 0.9 % IV SOLN: 500.0000 mL | INTRAVENOUS | Status: DC

## 2019-05-17 MED ORDER — FLUCONAZOLE 200 MG PO TABS: 200.0000 mg | ORAL_TABLET | Freq: Every day | ORAL | 0 refills | Status: DC

## 2019-05-17 NOTE — Progress Notes (Signed)
Called to room to assist during endoscopic procedure.  Patient ID and intended procedure confirmed with present staff. Received instructions for my participation in the procedure from the performing physician.  

## 2019-05-17 NOTE — Op Note (Addendum)
[place] Patient Name: [name] Procedure Date: 05/17/2019 10:00 AM MRN: [id number] Endoscopist: Remo Lipps P. Havery Moros , MD Age: 54 Referring MD:  Date of Birth: 1965/07/17 Gender: Male Account #: [id number] Procedure:                Colonoscopy Indications:              Screening for colorectal malignant neoplasm, This                            is the patient's first colonoscopy Medicines:                Monitored Anesthesia Care Procedure:                Pre-Anesthesia Assessment:                           - Prior to the procedure, a History and Physical                            was performed, and patient medications and                            allergies were reviewed. The patient's tolerance of                            previous anesthesia was also reviewed. The risks                            and benefits of the procedure and the sedation                            options and risks were discussed with the patient.                            All questions were answered, and informed consent                            was obtained. Prior Anticoagulants: The patient has                            taken no previous anticoagulant or antiplatelet                            agents. ASA Grade Assessment: III - A patient with                            severe systemic disease. After reviewing the risks                            and benefits, the patient was deemed in                            satisfactory condition to undergo the procedure.  After obtaining informed consent, the colonoscope                            was passed under direct vision. Throughout the                            procedure, the patient's blood pressure, pulse, and                            oxygen saturations were monitored continuously. The                            Colonoscope was introduced through the anus and                            advanced to the the  terminal ileum, with                            identification of the appendiceal orifice and IC                            valve. The colonoscopy was performed without                            difficulty. The patient tolerated the procedure                            well. The quality of the bowel preparation was                            inadequate. The terminal ileum, the ileocecal                            valve, the appendiceal orifice and the rectum were                            photographed. Scope In: 10:24:01 AM Scope Out: 10:43:02 AM Scope Withdrawal Time: 0 hours 14 minutes 2 seconds  Total Procedure Duration: 0 hours 19 minutes 1 second  Findings:                 The perianal and digital rectal examinations were                            normal.                           The terminal ileum appeared normal.                           A large laterally spreading polyp was found in the                            proximal ascending colon extending into a portion  of the IC valve and distal cecum. The polyp was                            sessile with some lobulated components, perhaps 5cm                            in size or so. I do not think it invades the                            entrance to the IC valve. Two small biopsies were                            taken from within the lobulated folds of the mid                            polyp that were difficult to visualize with a cold                            forceps for histology, rule out malignancy,                            although this overtly appears to be benign /                            adenomatous lesion. Tattoo not placed given cecal                            location with valve involvement.                           A large amount of semi-liquid stool was found in                            the entire colon, making visualization difficult.                            Several  minutes were spent lavaging the colon using                            copious amounts of sterile water, resulting in                            incomplete clearance with fair visualization. Most                            of the right colon and transverse colon was cleared                            and well visualized, however from splenic flexure                            through left colon the colonoscope clogged multiple  times and could not see well in multiple portions                            of the left colon. The prep was inadequate for                            screening purposes in these portions.                           The exam was otherwise without abnormality on                            direct and retroflexion views. The rectum was not                            well visualized due to stool burden. Complications:            No immediate complications. Estimated blood loss:                            Minimal. Estimated Blood Loss:     Estimated blood loss was minimal. Impression:               - Preparation of the colon was inadequate for                            screening purposes.                           - The examined portion of the ileum was normal.                           - One large laterally spreading polyp in the right                            colon as described. Biopsied, hopefully this is                            just an advanced adenoma. I do think it may be                            endoscopically resectable but would need EMR by                            advanced endoscopy in the hospital setting.                           - Normal colon otherwise - left colon not well                            visualized as above due to stool burden. Recommendation:           - Patient has a contact number available for  emergencies. The signs and symptoms of potential                            delayed  complications were discussed with the                            patient. Return to normal activities tomorrow.                            Written discharge instructions were provided to the                            patient.                           - Resume previous diet.                           - Continue present medications.                           - Await pathology results with further                            recommendations. Remo Lipps P. Stephanieann Popescu, MD 05/17/2019 10:57:35 AM This report has been signed electronically.

## 2019-05-17 NOTE — Patient Instructions (Signed)
Handout on polyps given. Pick up new medication today. Take Fluconazole 400 mg today, then 200mg  for 13 days.   YOU HAD AN ENDOSCOPIC PROCEDURE TODAY AT [facility]:   Refer to the procedure report that was given to you for any specific questions about what was found during the examination.  If the procedure report does not answer your questions, please call your gastroenterologist to clarify.  If you requested that your care partner not be given the details of your procedure findings, then the procedure report has been included in a sealed envelope for you to review at your convenience later.  YOU SHOULD EXPECT: Some feelings of bloating in the abdomen. Passage of more gas than usual.  Walking can help get rid of the air that was put into your GI tract during the procedure and reduce the bloating. If you had a lower endoscopy (such as a colonoscopy or flexible sigmoidoscopy) you may notice spotting of blood in your stool or on the toilet paper. If you underwent a bowel prep for your procedure, you may not have a normal bowel movement for a few days.  Please Note:  You might notice some irritation and congestion in your nose or some drainage.  This is from the oxygen used during your procedure.  There is no need for concern and it should clear up in a day or so.  SYMPTOMS TO REPORT IMMEDIATELY:   Following lower endoscopy (colonoscopy or flexible sigmoidoscopy):  Excessive amounts of blood in the stool  Significant tenderness or worsening of abdominal pains  Swelling of the abdomen that is new, acute  Fever of 100F or higher   Following upper endoscopy (EGD)  Vomiting of blood or coffee ground material  New chest pain or pain under the shoulder blades  Painful or persistently difficult swallowing  New shortness of breath  Fever of 100F or higher  Olheiser, tarry-looking stools  For urgent or emergent issues, a gastroenterologist can be reached at any hour by calling [phone]  [phone].   DIET:  We do recommend a small meal at first, but then you may proceed to your regular diet.  Drink plenty of fluids but you should avoid alcoholic beverages for 24 hours.  ACTIVITY:  You should plan to take it easy for the rest of today and you should NOT DRIVE or use heavy machinery until tomorrow (because of the sedation medicines used during the test).    FOLLOW UP: Our staff will call the number listed on your records 48-72 hours following your procedure to check on you and address any questions or concerns that you may have regarding the information given to you following your procedure. If we do not reach you, we will leave a message.  We will attempt to reach you two times.  During this call, we will ask if you have developed any symptoms of COVID 19. If you develop any symptoms (ie: fever, flu-like symptoms, shortness of breath, cough etc.) before then, please call [phone].  If you test positive for Covid 19 in the 2 weeks post procedure, please call and report this information to [place].    If any biopsies were taken you will be contacted by phone or by letter within the next 1-3 weeks.  Please call us at [phone] if you have not heard about the biopsies in 3 weeks.    SIGNATURES/CONFIDENTIALITY: You and/or your care partner have signed paperwork which will be entered into your electronic medical record.  These signatures attest to the fact that that the information above on your After Visit Summary has been reviewed and is understood.  Full responsibility of the confidentiality of this discharge information lies with you and/or your care-partner.

## 2019-05-17 NOTE — Progress Notes (Signed)
Pt tolerated well. VSS. Awake and to recovery. 

## 2019-05-17 NOTE — Op Note (Signed)
[place] Patient Name: [name] Procedure Date: 05/17/2019 10:01 AM MRN: [id number] Endoscopist: Remo Lipps P. 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 The patient's tolerance of                            previous anesthesia was also reviewed. The risks                            and benefits of the procedure and the sedation                            options and risks were discussed with the patient.                            All questions were answered, and informed consent                            was obtained. Prior Anticoagulants: The patient has                            taken no previous anticoagulant or antiplatelet                            agents. ASA Grade Assessment: III - A patient with                            severe systemic disease. After reviewing the risks                            and benefits, the patient was deemed in                            satisfactory condition to undergo the procedure.  After obtaining informed consent, the endoscope was                            passed under direct vision. Throughout the                            procedure, the patient's blood pressure, pulse, and                            oxygen saturations were monitored continuously. The                            Endoscope was introduced through the mouth, and                            advanced to the second part of duodenum.  The upper                            GI endoscopy was accomplished without difficulty.                            The patient tolerated the procedure well. Scope In: Scope Out: Findings:                 Esophagogastric landmarks were identified: the                            Z-line was found at 41 cm, the gastroesophageal                            junction was found at 41 cm and the upper extent of                            the gastric folds was found at 41 cm from the                            incisors.                           Localized mucosal changes characterized by nodulari                            mucosa noted at 38cm from the incisors were found                            in the lower third of the esophagus. Biopsies were                            taken with a cold forceps for histology.                           Diffuse, white plaques were found in the middle  third of the esophagus and in the lower third of                            the esophagus, grossly consistent with esophageal                            candidiasis.                           The exam of the esophagus was otherwise normal. No                            obvious esophageal varices notes however the mucosa                            was hard to visualize due to severe candidiasis.                           Patchy erythematous mucosa was found in the gastric                            body and in the gastric antrum, likely due to                            portal hypertension. No obvious ulcers noted.                           The exam of the stomach was otherwise normal.                           Biopsies were taken with a cold forceps in the                            gastric body, at the incisura and in the gastric                            antrum for histology.                           The duodenal bulb and second portion of the                            duodenum were  normal. Complications:            No immediate complications. Estimated blood loss:                            Minimal. Estimated Blood Loss:     Estimated blood loss was minimal. Impression:               - Esophagogastric landmarks identified.                           - Severe esophageal candidiasis                           -  No obvious esophageal varices, although difficult                            to assess for mild varices due to inflammation /                            candidiasis                           - Localized nodular mucosa in the esophagus at 38cm                            from the incisors, suspect inflammatory in nature.                            Biopsied.                           - Erythematous mucosa in the gastric body and                            antrum, suspect due to portal hypertension,                            biopsies taken to rule out H pylori.                           - No gastric varices                           - Normal stomach otherwise                           - Normal duodenal bulb and second portion of the                            duodenum. Recommendation:           - Patient has a contact number available for                            emergencies. The signs and symptoms of potential                            delayed complications were discussed with the                            patient. Return to normal activities tomorrow.                            Written discharge instructions were provided to the                            patient.                           - Resume  previous diet.                           - Continue present medications.                           - Await pathology results.                           - Start fluconazole 400mg  x 1 dose, then 200mg  /                            day for another 13 days to treat esophageal                            candidiasis Carlota Raspberry. Bryce Cheever, MD 05/17/2019 11:08:44 AM This  report has been signed electronically.

## 2019-05-18 LAB — IGG: IgG (Immunoglobin G), Serum: 2211 mg/dL — ABNORMAL HIGH (ref 600–1640)

## 2019-05-18 LAB — ANA: Anti Nuclear Antibody (ANA): NEGATIVE

## 2019-05-18 LAB — MITOCHONDRIAL ANTIBODIES: Mitochondrial M2 Ab, IgG: <=20 U

## 2019-05-18 LAB — CERULOPLASMIN: Ceruloplasmin: 35 mg/dL (ref 18–36)

## 2019-05-18 LAB — HEPATITIS B SURFACE ANTIGEN: Hepatitis B Surface Ag: NONREACTIVE

## 2019-05-18 LAB — HEPATITIS B SURFACE ANTIBODY,QUALITATIVE: Hep B S Ab: NONREACTIVE

## 2019-05-18 LAB — AFP TUMOR MARKER: AFP-Tumor Marker: 4 ng/mL (ref <6.1)

## 2019-05-18 LAB — HEPATITIS C ANTIBODY
Hepatitis C Ab: NONREACTIVE
SIGNAL TO CUT-OFF: 0.33 (ref <1.00)

## 2019-05-18 LAB — ANTI-SMOOTH MUSCLE ANTIBODY, IGG: Actin (Smooth Muscle) Antibody (IGG): <20 U (ref <20)

## 2019-05-18 LAB — HEPATITIS A ANTIBODY, TOTAL: Hepatitis A AB,Total: REACTIVE — AB

## 2019-05-18 LAB — ALPHA-1-ANTITRYPSIN: A-1 Antitrypsin, Ser: 164 mg/dL (ref 83–199)

## 2019-05-21 ENCOUNTER — Telehealth: Payer: Self-pay

## 2019-05-21 NOTE — Telephone Encounter (Signed)
1. Have you developed a fever since your procedure? No  2.   Have you had an respiratory symptoms (SOB or cough) since your procedure? No  3.   Have you tested positive for COVID 19 since your procedure No  4.   Have you had any family members/close contacts diagnosed with the COVID 19 since your procedure?  No   If yes to any of these questions please route to Joylene John, RN and Alphonsa Gin, RN.   Follow up Call-  Call back number 05/17/2019  Post procedure Call Back phone  # [phone]  Permission to leave phone message Yes  Some recent data might be hidden     Patient questions:  Do you have a fever, pain , or abdominal swelling? No. Pain Score  0 *  Have you tolerated food without any problems? Yes.    Have you been able to return to your normal activities? Yes.    Do you have any questions about your discharge instructions: Diet   No. Medications  No. Follow up visit  No.  Do you have questions or concerns about your Care? No.  Actions: * If pain score is 4 or above: No action needed, pain <4.

## 2019-05-22 ENCOUNTER — Other Ambulatory Visit: Payer: Self-pay

## 2019-05-22 ENCOUNTER — Telehealth: Payer: Self-pay

## 2019-05-22 NOTE — Telephone Encounter (Signed)
-----   Message from Irving Copas., MD sent at 05/22/2019  8:14 AM EST ----- Regarding: RE: EMR request Steve,No problem.  Happy to be of assistance with an attempt at resection.Seville Brick, please set up for a clinic visit and then we can set up his colonoscopy with EMR at that time.  Please make note that we should do a 2-day prep for patient.Thanks.GM ----- Message ----- From: Yetta Flock, MD Sent: 05/21/2019   6:15 PM EST To: Irving Copas., MD Subject: RE: EMR request                                Thanks Gabe. 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  I think amenable to endoscopic resection but could be a tough one. I would double prep him as his prep was not too good for this initial exam. I'll let him know, if Cully Luckow and reach out to schedule clinic with you and an EMR at the hospital.   I appreciate it. [name] ----- Message ----- From: Irving Copas., MD Sent: 05/18/2019   8:48 AM EST To: Yetta Flock, MD Subject: RE: EMR request                                [name], Just shoot me a follow up Inbox when the pathology confirms this and then Rockland Kotarski can reach out to him for Clinic visit and EMR set up. Thanks. GM ----- Message ----- From: Yetta Flock, MD Sent: 05/17/2019   7:08 PM EST To: Irving Copas., MD Subject: EMR request                                    Sloan Leiter,  Scoped this guy today, he's got a pretty large laterally spreading lesion of the right colon extending over the IC valve but not invading it. I could use your help with this one. It's kind of lobulated, I took a small biopsy of the center of it behind a fold I couldn't see too well just to make sure no malignancy but I think it will be just adenoma. Thanks for your opinion.  [name]

## 2019-05-22 NOTE — Telephone Encounter (Signed)
The pt has been scheduled for 05/30/19 at 150 pm with Dr Rush Landmark.  The pt has been advised

## 2019-05-25 ENCOUNTER — Telehealth: Payer: Self-pay | Admitting: Family Medicine

## 2019-05-25 NOTE — Telephone Encounter (Signed)
Request to superv to make this a true 89min appt in Epic.  --glh

## 2019-05-29 ENCOUNTER — Other Ambulatory Visit: Payer: Self-pay

## 2019-05-29 ENCOUNTER — Ambulatory Visit (INDEPENDENT_AMBULATORY_CARE_PROVIDER_SITE_OTHER): Payer: BC Managed Care – PPO | Admitting: Family Medicine

## 2019-05-29 ENCOUNTER — Encounter: Payer: Self-pay | Admitting: Family Medicine

## 2019-05-29 VITALS — BP 144/84 | HR 81 | Temp 98.5°F | Resp 12 | Ht 71.0 in | Wt 242.5 lb

## 2019-05-29 DIAGNOSIS — E1159 Type 2 diabetes mellitus with other circulatory complications: Secondary | ICD-10-CM | POA: Diagnosis not present

## 2019-05-29 DIAGNOSIS — E782 Mixed hyperlipidemia: Secondary | ICD-10-CM

## 2019-05-29 DIAGNOSIS — E1169 Type 2 diabetes mellitus with other specified complication: Secondary | ICD-10-CM

## 2019-05-29 DIAGNOSIS — M7989 Other specified soft tissue disorders: Secondary | ICD-10-CM | POA: Diagnosis not present

## 2019-05-29 DIAGNOSIS — E1140 Type 2 diabetes mellitus with diabetic neuropathy, unspecified: Secondary | ICD-10-CM

## 2019-05-29 DIAGNOSIS — I152 Hypertension secondary to endocrine disorders: Secondary | ICD-10-CM

## 2019-05-29 DIAGNOSIS — I10 Essential (primary) hypertension: Secondary | ICD-10-CM

## 2019-05-29 DIAGNOSIS — E1142 Type 2 diabetes mellitus with diabetic polyneuropathy: Secondary | ICD-10-CM

## 2019-05-29 LAB — POCT GLYCOSYLATED HEMOGLOBIN (HGB A1C): Hemoglobin A1C: 8.1 % — AB (ref 4.0–5.6)

## 2019-05-29 MED ORDER — METFORMIN HCL 500 MG PO TABS: 1000.0000 mg | ORAL_TABLET | Freq: Two times a day (BID) | ORAL | 2 refills | Status: DC

## 2019-05-29 NOTE — Progress Notes (Signed)
Impression and Recommendations:    1. Type 2 diabetes mellitus with other specified complication, without long-term current use of insulin ([name])   2. Hypertension associated with diabetes ([name])   3. Swelling of both lower extremities   4. Diabetic peripheral neuropathy associated with type 2 diabetes mellitus ([name])   5. Diabetic neuropathy, painful ([name])   6. Mixed diabetic hyperlipidemia associated with type 2 diabetes mellitus ([name])     - Echocardiogram ordered last OV, but patient has not yet obtained. Tells me today he does not need it   Type 2 diabetes mellitus - Poorly Controlled, Unstable - A1c 8.1 last check, elevated, up from 6.7 last check. - A1c suboptimally managed, uncontrolled at this time.  - Per patient, has not been taking his metformin as prescribed. - Resume metformin as prescribed, after breakfast and after dinner.  See med list.  - Encouraged patient to check his blood sugars.  - If A1c is not under 7.0 next OV, will provide additional medication/management.  - Counseled patient on pathophysiology of disease and discussed various treatment options, which always includes dietary and lifestyle modification as first line.    - Importance of low carb, heart-healthy diet discussed with patient in addition to regular aerobic exercise of 19mn 5d/week or more.   - Check FBS and 2 hours after the biggest meal of your day.  Keep log and bring in next OV for my review.     - Also told patient if you ever feel poorly, please check your blood pressure and blood sugar, as one or the other could be the cause of your symptoms.  - Pt reminded about need for yearly eye and foot exams.  Told patient to make appt.for diabetic eye exam, CMAs here will do foot exams  - Handouts provided at patient's desire and or told to go online at the American Diabetes Association website for further information  - We will continue to monitor.   Hypertension associated with DM -  Patient placed on lasix last OV for blood pressure, as well as BLE swelling patient took just occasionally and now symptoms have completely resolved.  Not taking any longer - CMP re-checked 2 weeks ago and was stable.  - BP elevated on intake today. - Notes he did smoke marijuana prior to coming in to the office today.  - Per patient, BP runs WNL at home. - Continue treatment plan as established.  - Counseled patient on pathophysiology of disease and discussed various treatment options, which always includes dietary and lifestyle modification as first line.   - Lifestyle changes such as dash and heart healthy diets and engaging in a regular exercise program discussed extensively with patient.   - Ambulatory blood pressure monitoring encouraged at least 3 times weekly.  Keep log and bring in every office visit.  Reminded patient that if they ever feel poorly in any way, to check their blood pressure and pulse.  - Handouts provided at patient's desire and/or told to go online at the [place] for further information  - We will continue to monitor.   Mixed Diabetic Hyperlipidemia - Cholesterol levels at goal last check. - Continue treatment plan as established.  - Ongoing prudent dietary changes such as low saturated & trans fat diets for hyperlipidemia and low carb/ ketogenic diets for hypertriglyceridemia discussed with patient.    - Encouraged patient to follow AHA guidelines for regular exercise and also engage in weight loss if BMI  above 25.   We will continue to monitor and re-check as discussed.   Diabetic peripheral neuropathy associated with type 2 DM - Increased gabapentin last OV. - Stable at this time, well-managed on increased dose of gabapentin. - Continue management as established.  - Will continue to monitor and adjust treatment plan in future PRN.   Swelling of both lower extremities - Stable at this time. - Now resolved on Lasix  management. - Per patient, took Lasix for a couple of days, and then discontinued.  - Will continue to monitor and adjust treatment plan in future PRN.   Lifestyle & Preventative Health Maintenance - Advised patient to continue working toward exercising and prudent weight loss to improve overall mental, physical, and emotional health.    - Reviewed the "spokes of the wheel" of mood and health management.  Stressed the importance of ongoing prudent habits, including regular exercise, appropriate sleep hygiene, healthful dietary habits, and prayer/meditation to relax.  - Encouraged patient to engage in daily physical activity as tolerated, especially a formal exercise routine.  Recommended that the patient eventually strive for at least 150 minutes of moderate cardiovascular activity per week according to guidelines established by the [facility].   - Healthy dietary habits encouraged, including low-carb, and high amounts of lean protein in diet.   - Patient should also consume adequate amounts of water.  - Health counseling performed.  All questions answered.    Orders Placed This Encounter  Procedures  . POCT glycosylated hemoglobin (Hb A1C)    Meds ordered this encounter  Medications  . metFORMIN (GLUCOPHAGE) 500 MG tablet    Sig: Take 2 tablets (1,000 mg total) by mouth 2 (two) times daily with a meal.    Dispense:  60 tablet    Refill:  2    Needs office visit for further refills    Medications Discontinued During This Encounter  Medication Reason  . metFORMIN (GLUCOPHAGE) 500 MG tablet Reorder    Gross side effects, risk and benefits, and alternatives of medications and treatment plan in general discussed with patient.  Patient is aware that all medications have potential side effects and we are unable to predict every side effect or drug-drug interaction that may occur.   Patient will call with any questions prior to using medication if they have concerns.    Expresses verbal  understanding and consents to current therapy and treatment regimen.  No barriers to understanding were identified.  Red flag symptoms and signs discussed in detail.  Patient expressed understanding regarding what to do in case of emergency\urgent symptoms  Please see AVS handed out to patient at the end of our visit for further patient instructions/ counseling done pertaining to today's office visit.   Return for f/up in 3 months- reck A1c, 8.1 this time and goal less than 7.    Note:  This note was prepared with assistance of Dragon voice recognition software. Occasional wrong-word or sound-a-like substitutions may have occurred due to the inherent limitations of voice recognition software.  This document serves as a record of services personally performed by Mellody Dance, DO. It was created on her behalf by [name], a trained medical scribe. The creation of this record is based on the scribe's personal observations and the provider's statements to them.   This case required medical decision making of at least moderate complexity. The above documentation has been reviewed to be accurate and was completed by Marjory Sneddon, D.O.      --------------------------------------------------------------------------------------------------------------------------------------------------------------------------------------------------------------------------------------------  Subjective:    I, [name], am serving as scribe for Dr. Mellody Dance.  HPI: [name] is a 54 y.o. male who presents to [place] [facility] today for issues as discussed below.  - Peripheral Neuropathy On increased gabapentin, noticed a difference at night.  States now he wakes up in the morning and his legs are sleep.  "It takes me a little longer to move them around a bit."  He feels it's worth it regardless; he has good pain relief and can sleep better at night.   He continues experiencing "sharp pinprick" sensation sometimes, which is the only thing that hasn't been alleviated.    His feet do fall asleep when he's asleep in his chair.  Notes now he sits more on the bed instead of the chair, "and the swelling hasn't come back."    - Cardiac Health Notes he's had so many appointments, he might have missed his echocardiogram by accident.  Denies chest pain or shortness of breath with activity.  HPI:  Hypertension:  -  His blood pressure at home has been running: around 120 usually at home.  - Patient reports good compliance with medication and/or lifestyle modification.  - Management on Lasix Notes "after maybe a day or two, all the swelling was gone."  Notes no more problems with swelling.  Says if he has some problems with swelling, he takes one.  He took the lasix only for a couple of days, and then stopped.  - His denies acute concerns or problems related to treatment plan  - He denies new onset of: chest pain, exercise intolerance, shortness of breath, dizziness, visual changes, headache, lower extremity swelling or claudication.   Last 3 blood pressure readings in our office are as follows: BP Readings from Last 3 Encounters:  05/29/19 (!) 144/84  05/17/19 127/77  05/11/19 (!) 148/78   Filed Weights   05/29/19 1559  Weight: 242 lb 8 oz (110 kg)    HPI:   Diabetes Mellitus:  A1c now elevated.  He does not check his blood sugars at home.  Notes once his esophagus cleared up (recently, using medication for H. Pylori and yeast), he began eating a bunch again.  States he wasn't taking his diabetes medication in the past, because he wasn't eating, and now that he's eating again, he needs to remember to take the medication.  - His denies acute concerns or problems related to treatment plan  - He denies new concerns.  Denies polyuria/polydipsia, hypo/ hyperglycemia symptoms.  Denies new onset of: chest pain, exercise intolerance,  shortness of breath, dizziness, visual changes, headache, lower extremity swelling or claudication.   Last A1C in the office was:  Lab Results  Component Value Date   HGBA1C 8.1 (A) 05/29/2019   HGBA1C 6.7 (A) 02/26/2019   HGBA1C 6.9 (A) 11/14/2018   Lab Results  Component Value Date   MICROALBUR 10 11/14/2018   LDLCALC 47 11/30/2018   CREATININE 0.94 05/11/2019   BP Readings from Last 3 Encounters:  05/29/19 (!) 144/84  05/17/19 127/77  05/11/19 (!) 148/78   Wt Readings from Last 3 Encounters:  05/29/19 242 lb 8 oz (110 kg)  05/17/19 239 lb (108.4 kg)  05/11/19 239 lb (108.4 kg)   HPI:  Hyperlipidemia:  54 y.o. male here for cholesterol follow-up.   - Patient reports good compliance with treatment plan of:  medication and/ or lifestyle management.    - Patient denies any acute  concerns or problems with management plan   - He denies new onset of: myalgias, arthralgias, increased fatigue more than normal, chest pains, exercise intolerance, shortness of breath, dizziness, visual changes, headache, lower extremity swelling or claudication.   Most recent cholesterol panel was:  Lab Results  Component Value Date   CHOL 86 (L) 11/30/2018   HDL 29 (L) 11/30/2018   LDLCALC 47 11/30/2018   LDLDIRECT 46 11/30/2018   TRIG 49 11/30/2018   CHOLHDL 3.0 11/30/2018   Hepatic Function Latest Ref Rng & Units 05/11/2019 11/30/2018 05/18/2018  Total Protein 6.0 - 8.3 g/dL 7.6 7.9 7.4  Albumin 3.5 - 5.2 g/dL 3.3(L) 3.7(L) 3.8  AST 0 - 37 U/L '23 26 20  ' ALT 0 - 53 U/L '28 19 21  ' Alk Phosphatase 39 - 117 U/L 169(H) 159(H) 128(H)  Total Bilirubin 0.2 - 1.2 mg/dL 1.0 0.7 0.6  Bilirubin, Direct 0.0 - 0.3 mg/dL - - -      Wt Readings from Last 3 Encounters:  05/29/19 242 lb 8 oz (110 kg)  05/17/19 239 lb (108.4 kg)  05/11/19 239 lb (108.4 kg)   BP Readings from Last 3 Encounters:  05/29/19 (!) 144/84  05/17/19 127/77  05/11/19 (!) 148/78   Pulse Readings from Last 3 Encounters:    05/29/19 81  05/17/19 72  05/11/19 88   BMI Readings from Last 3 Encounters:  05/29/19 33.82 kg/m  05/17/19 33.33 kg/m  05/11/19 33.33 kg/m     Patient Care Team    Relationship Specialty Notifications Start End  Mellody Dance, DO PCP - General Family Medicine  07/28/17   Minette Brine, [place]  General Practice  07/29/17      Patient Active Problem List   Diagnosis Date Noted  . Mixed diabetic hyperlipidemia associated with type 2 diabetes mellitus ([name]) 11/16/2017  . Hypertension associated with diabetes ([place]) 07/28/2017  . Diabetes mellitus ([place]) 07/28/2017  . Diabetic neuropathy, painful ([place]) 06/22/2012  . Diabetic peripheral neuropathy associated with type 2 diabetes mellitus ([place]) 11/16/2017  . Morbidly obese ([place]) 07/28/2017  . Vitamin D deficiency 07/28/2017  . History of noncompliance with medical treatment 07/28/2017  . Swelling of both lower extremities 05/29/2019  . Thrombocytopenia ([place]) 04/03/2019  . Peripheral arterial disease ([place]) 02/15/2019  . Hyperlipidemia 02/15/2019  . Persistent proteinuria 11/14/2018  . Low TSH level 11/14/2018  . High risk medications (not anticoagulants) long-term use 11/14/2018  . Noncompliance with diet and medication regimen 11/14/2018  . Hiatal hernia 11/14/2018  . Anorexia 11/14/2018  . Diabetic ulcer of toe of right foot associated with diabetes mellitus due to underlying condition, with fat layer exposed ([place]) 07/11/2018  . Healthcare maintenance 07/11/2018  . Essential hypertension, benign 06/22/2012  . Pancreatitis, acute 06/22/2012  . Nausea with vomiting 06/22/2012  . Abdominal pain, epigastric 06/22/2012  . Acute hyperkalemia 06/22/2012  . Leukocytosis, unspecified 06/22/2012    Past Medical history, Surgical history, Family history, Social history, Allergies and Medications have been entered into the medical record, reviewed and changed as needed.    Current Meds  Medication Sig  . atorvastatin (LIPITOR) 20  MG tablet Take 1 tablet (20 mg total) by mouth at bedtime.  . fluconazole (DIFLUCAN) 200 MG tablet Take 1 tablet (200 mg total) by mouth daily for 14 days. Take 400 mg on day one, then 252m daily for 13 days  . gabapentin (NEURONTIN) 600 MG tablet Take 2 tablets (1,200 mg total) by mouth 3 (three) times daily. Max 3,600 mg per  day  . Olmesartan-amLODIPine-HCTZ 20-5-12.5 MG TABS Take by mouth daily.  Marland Kitchen omeprazole (PRILOSEC) 40 MG capsule Take 1 capsule (40 mg total) by mouth daily.  . ondansetron (ZOFRAN ODT) 4 MG disintegrating tablet Take 1 tablet (4 mg total) by mouth every 6 (six) hours as needed for nausea or vomiting.  . Vitamin D, Ergocalciferol, (DRISDOL) 1.25 MG (50000 UT) CAPS capsule TAKE 1 CAPSULE BY MOUTH TWO TIMES A WEEK.  WEDNESDAY AND SUNDAY    Allergies:  No Known Allergies   Review of Systems:  A fourteen system review of systems was performed and found to be positive as per HPI.   Objective:   Blood pressure (!) 144/84, pulse 81, temperature 98.5 F (36.9 C), temperature source Oral, resp. rate 12, height '5\' 11"'  (1.803 m), weight 242 lb 8 oz (110 kg), SpO2 100 %. Body mass index is 33.82 kg/m. General:  Well Developed, well nourished, appropriate for stated age.  Neuro:  Alert and oriented,  extra-ocular muscles intact  HEENT:  Normocephalic, atraumatic, neck supple, no carotid bruits appreciated  Skin:  no gross rash, warm, pink. Cardiac:  RRR, S1 S2 Respiratory:  ECTA B/L and A/P, Not using accessory muscles, speaking in full sentences- unlabored. Vascular:  Ext warm, no cyanosis apprec.; cap RF less 2 sec. Psych:  No HI/SI, judgement and insight good, Euthymic mood. Full Affect. Bilateral Lower Extremities:  1+ bilateral pitting edema, which is significantly improved from prior.

## 2019-05-29 NOTE — Patient Instructions (Addendum)
Your diabetes is poorly controlled at 8.1.  It is very important we get this down to under 7.  Please read the below information on tips and tricks and further information about diabetes and what your goals are.  If you change your mind and would like a diabetic glucometer, please let us know as this would be very helpful for you to know how well your sugars are doing.       Diabetes Mellitus and Standards of Medical Care  Managing diabetes (diabetes mellitus) can be complicated. Your diabetes treatment may be managed by a team of health care providers, including:  A diet and nutrition specialist (registered dietitian).  A nurse.  A certified diabetes educator (CDE).  A diabetes specialist (endocrinologist).  An eye doctor.  A primary care provider.  A dentist.  Your health care providers follow a schedule in order to help you get the best quality of care. The following schedule is a general guideline for your diabetes management plan. Your health care providers may also give you more specific instructions.  HbA1c (hemoglobin A1c) test This test provides information about blood sugar (glucose) control over the previous 2-3 months. It is used to check whether your diabetes management plan needs to be adjusted.  If you are meeting your treatment goals, this test is done at least 2 times a year.  If you are not meeting treatment goals or if your treatment goals have changed, this test is done 4 times a year.  Blood pressure test  This test is done at every routine medical visit. For most people, the goal is less than 130/80. Ask your health care provider what your goal blood pressure should be.  Dental and eye exams  Visit your dentist two times a year.  If you have type 1 diabetes, get an eye exam 3-5 years after you are diagnosed, and then once a year after your first exam. ? If you were diagnosed with type 1 diabetes as a child, get an eye exam when you are age 86 or older  and have had diabetes for 3-5 years. After the first exam, you should get an eye exam once a year.  If you have type 2 diabetes, have an eye exam as soon as you are diagnosed, and then once a year after your first exam.  Foot care exam  Visual foot exams are done at every routine medical visit. The exams check for cuts, bruises, redness, blisters, sores, or other problems with the feet.  A complete foot exam is done by your health care provider once a year. This exam includes an inspection of the structure and skin of your feet, and a check of the pulses and sensation in your feet. ? Type 1 diabetes: Get your first exam 3-5 years after diagnosis. ? Type 2 diabetes: Get your first exam as soon as you are diagnosed.  Check your feet every day for cuts, bruises, redness, blisters, or sores. If you have any of these or other problems that are not healing, contact your health care provider.  Kidney function test (urine microalbumin)  This test is done once a year. ? Type 1 diabetes: Get your first test 5 years after diagnosis. ? Type 2 diabetes: Get your first test as soon as you are diagnosed._  If you have chronic kidney disease (CKD), get a serum creatinine and estimated glomerular filtration rate (eGFR) test once a year.  Lipid profile (cholesterol, HDL, LDL, triglycerides)  This test should be  done when you are diagnosed with diabetes, and every 5 years after the first test. If you are on medicines to lower your cholesterol, you may need to get this test done every year. ? The goal for LDL is less than 100 mg/dL (5.5 mmol/L). If you are at high risk, the goal is less than 70 mg/dL (3.9 mmol/L). ? The goal for HDL is 40 mg/dL (2.2 mmol/L) for men and 50 mg/dL(2.8 mmol/L) for women. An HDL cholesterol of 60 mg/dL (3.3 mmol/L) or higher gives some protection against heart disease. ? The goal for triglycerides is less than 150 mg/dL (8.3 mmol/L).  Immunizations  The yearly flu (influenza)  vaccine is recommended for everyone 6 months or older who has diabetes.  The pneumonia (pneumococcal) vaccine is recommended for everyone 2 years or older who has diabetes. If you are 62 or older, you may get the pneumonia vaccine as a series of two separate shots.  The hepatitis B vaccine is recommended for adults shortly after they have been diagnosed with diabetes.  The Tdap (tetanus, diphtheria, and pertussis) vaccine should be given: ? According to normal childhood vaccination schedules, for children. ? Every 10 years, for adults who have diabetes.  The shingles vaccine is recommended for people who have had chicken pox and are 50 years or older.  Mental and emotional health  Screening for symptoms of eating disorders, anxiety, and depression is recommended at the time of diagnosis and afterward as needed. If your screening shows that you have symptoms (you have a positive screening result), you may need further evaluation and be referred to a mental health care provider.  Diabetes self-management education  Education about how to manage your diabetes is recommended at diagnosis and ongoing as needed.  Treatment plan  Your treatment plan will be reviewed at every medical visit.  Summary  Managing diabetes (diabetes mellitus) can be complicated. Your diabetes treatment may be managed by a team of health care providers.  Your health care providers follow a schedule in order to help you get the best quality of care.  Standards of care including having regular physical exams, blood tests, blood pressure monitoring, immunizations, screening tests, and education about how to manage your diabetes.  Your health care providers may also give you more specific instructions based on your individual health.      Type 2 Diabetes Mellitus, Self Care, Adult Caring for yourself after you have been diagnosed with type 2 diabetes (type 2 diabetes mellitus) means keeping your blood sugar  (glucose) under control with a balance of:  Nutrition.  Exercise.  Lifestyle changes.  Medicines or insulin, if necessary.  Support from your team of health care providers and others.  The following information explains what you need to know to manage your diabetes at home. What do I need to do to manage my blood glucose?  Check your blood glucose every day, as often as told by your health care provider.  Contact your health care provider if your blood glucose is above your target for 2 tests in a row.  Have your A1c (hemoglobin A1c) level checked at least two times a year, or as often as told by your health care provider. Your health care provider will set individualized treatment goals for you. Generally, the goal of treatment is to maintain the following blood glucose levels:  Before meals (preprandial): 80-130 mg/dL (4.4-7.2 mmol/L).  After meals (postprandial): below 180 mg/dL (10 mmol/L).  A1c level: less than 7%.  What  do I need to know about hyperglycemia and hypoglycemia? What is hyperglycemia? Hyperglycemia, also called high blood glucose, occurs when blood glucose is too high.Make sure you know the early signs of hyperglycemia, such as:  Increased thirst.  Hunger.  Feeling very tired.  Needing to urinate more often than usual.  Blurry vision.  What is hypoglycemia? Hypoglycemia, also called low blood glucose, occurswith a blood glucose level at or below 70 mg/dL (3.9 mmol/L). The risk for hypoglycemia increases during or after exercise, during sleep, during illness, and when skipping meals or not eating for a long time (fasting). It is important to know the symptoms of hypoglycemia and treat it right away. Always have a 15-gram rapid-acting carbohydrate snack with you to treat low blood glucose. Family members and close friends should also know the symptoms and should understand how to treat hypoglycemia, in case you are not able to treat yourself. What are  the symptoms of hypoglycemia? Hypoglycemia symptoms can include:  Hunger.  Anxiety.  Sweating and feeling clammy.  Confusion.  Dizziness or feeling light-headed.  Sleepiness.  Nausea.  Increased heart rate.  Headache.  Blurry vision.  Seizure.  Nightmares.  Tingling or numbness around the mouth, lips, or tongue.  A change in speech.  Decreased ability to concentrate.  A change in coordination.  Restless sleep.  Tremors or shakes.  Fainting.  Irritability.  How do I treat hypoglycemia?  If you are alert and able to swallow safely, follow the 15:15 rule:  Take 15 grams of a rapid-acting carbohydrate. Rapid-acting options include: ? 1 tube of glucose gel. ? 3 glucose pills. ? 6-8 pieces of hard candy. ? 4 oz (120 mL) of fruit juice. ? 4 oz (120 mL) of regular (not diet) soda.  Check your blood glucose 15 minutes after you take the carbohydrate.  If the repeat blood glucose level is still at or below 70 mg/dL (3.9 mmol/L), take 15 grams of a carbohydrate again.  If your blood glucose level does not increase above 70 mg/dL (3.9 mmol/L) after 3 tries, seek emergency medical care.  After your blood glucose level returns to normal, eat a meal or a snack within 1 hour.  How do I treat severe hypoglycemia? Severe hypoglycemia is when your blood glucose level is at or below 54 mg/dL (3 mmol/L). Severe hypoglycemia is an emergency. Do not wait to see if the symptoms will go away. Get medical help right away. Call your local emergency services (911 in the [place] Do not drive yourself to the hospital. If you have severe hypoglycemia and you cannot eat or drink, you may need an injection of glucagon. A family member or close friend should learn how to check your blood glucose and how to give you a glucagon injection. Ask your health care provider if you need to have an emergency glucagon injection kit available. Severe hypoglycemia may need to be treated in a  hospital. The treatment may include getting glucose through an IV tube. You may also need treatment for the cause of your hypoglycemia. Can having diabetes put me at risk for other conditions? Having diabetes can put you at risk for other long-term (chronic) conditions, such as heart disease and kidney disease. Your health care provider may prescribe medicines to help prevent complications from diabetes. These medicines may include:  Aspirin.  Medicine to lower cholesterol.  Medicine to control blood pressure.  What else can I do to manage my diabetes? Take your diabetes medicines as told  If  your health care provider prescribed insulin or diabetes medicines, take them every day.  Do not run out of insulin or other diabetes medicines that you take. Plan ahead so you always have these available.  If you use insulin, adjust your dosage based on how physically active you are and what foods you eat. Your health care provider will tell you how to adjust your dosage. Make healthy food choices  The things that you eat and drink affect your blood glucose and your insulin dosage. Making good choices helps to control your diabetes and prevent other health problems. A healthy meal plan includes eating lean proteins, complex carbohydrates, fresh fruits and vegetables, low-fat dairy products, and healthy fats. Make an appointment to see a diet and nutrition specialist (registered dietitian) to help you create an eating plan that is right for you. Make sure that you:  Follow instructions from your health care provider about eating or drinking restrictions.  Drink enough fluid to keep your urine clear or pale yellow.  Eat healthy snacks between nutritious meals.  Track the carbohydrates that you eat. Do this by reading food labels and learning the standard serving sizes of foods.  Follow your sick day plan whenever you cannot eat or drink as usual. Make this plan in advance with your health care  provider.  Stay active  Exercise regularly, as told by your health care provider. This may include:  Stretching and doing strength exercises, such as yoga or weightlifting, at least 2 times a week.  Doing at least 150 minutes of moderate-intensity or vigorous-intensity exercise each week. This could be brisk walking, biking, or water aerobics. ? Spread out your activity over at least 3 days of the week. ? Do not go more than 2 days in a row without doing some kind of physical activity.  When you start a new exercise or activity, work with your health care provider to adjust your insulin, medicines, or food intake as needed. Make healthy lifestyle choices  Do not use any tobacco products, such as cigarettes, chewing tobacco, and e-cigarettes. If you need help quitting, ask your health care provider.  If your health care provider says that alcohol is safe for you, limit alcohol intake to no more than 1 drink per day for nonpregnant women and 2 drinks per day for men. One drink equals 12 oz of beer, 5 oz of wine, or 1 oz of hard liquor.  Learn to manage stress. If you need help with this, ask your health care provider. Care for your body   Keep your immunizations up to date. In addition to getting vaccinations as told by your health care provider, it is recommended that you get vaccinated against the following illnesses: ? The flu (influenza). Get a flu shot every year. ? Pneumonia. ? Hepatitis B.  Schedule an eye exam soon after your diagnosis, and then one time every year after that.  Check your skin and feet every day for cuts, bruises, redness, blisters, or sores. Schedule a foot exam with your health care provider once every year.  Brush your teeth and gums two times a day, and floss at least one time a day. Visit your dentist at least once every 6 months.  Maintain a healthy weight. General instructions  Take over-the-counter and prescription medicines only as told by your  health care provider.  Share your diabetes management plan with people in your workplace, school, and household.  Check your urine for ketones when you are ill and  as told by your health care provider.  Ask your health care provider: ? Do I need to meet with a diabetes educator? ? Where can I find a support group for people with diabetes?  Carry a medical alert card or wear medical alert jewelry.  Keep all follow-up visits as told by your health care provider. This is important. Where to find more information: For more information about diabetes, visit:  American Diabetes Association (ADA): www.diabetes.org  American Association of Diabetes Educators (AADE): www.diabeteseducator.org/patient-resources  This information is not intended to replace advice given to you by your health care provider. Make sure you discuss any questions you have with your health care provider. Document Released: 08/11/2015 Document Revised: 09/25/2015 Document Reviewed: 05/23/2015 Elsevier Interactive Patient Education  2017 [place].      Blood Glucose Monitoring, Adult Monitoring your blood sugar (glucose) helps you manage your diabetes. It also helps you and your health care provider determine how well your diabetes management plan is working. Blood glucose monitoring involves checking your blood glucose as often as directed, and keeping a record (log) of your results over time. Why should I monitor my blood glucose? Checking your blood glucose regularly can:  Help you understand how food, exercise, illnesses, and medicines affect your blood glucose.  Let you know what your blood glucose is at any time. You can quickly tell if you are having low blood glucose (hypoglycemia) or high blood glucose (hyperglycemia).  Help you and your health care provider adjust your medicines as needed.  When should I check my blood glucose? Follow instructions from your health care provider about how often to  check your blood glucose.   This may depend on:  The type of diabetes you have.  How well-controlled your diabetes is.  Medicines you are taking.  If you have type 1 diabetes:  Check your blood glucose at least 2 times a day.  Also check your blood glucose: ? Before every insulin injection. ? Before and after exercise. ? Between meals. ? 2 hours after a meal. ? Occasionally between 2:00 a.m. and 3:00 a.m., as directed. ? Before potentially dangerous tasks, like driving or using heavy machinery. ? At bedtime.  You may need to check your blood glucose more often, up to 6-10 times a day: ? If you use an insulin pump. ? If you need multiple daily injections (MDI). ? If your diabetes is not well-controlled. ? If you are ill. ? If you have a history of severe hypoglycemia. ? If you have a history of not knowing when your blood glucose is getting low (hypoglycemia unawareness).  If you have type 2 diabetes:  If you take insulin or other diabetes medicines, check your blood glucose at least 2 times a day.  If you are on intensive insulin therapy, check your blood glucose at least 4 times a day. Occasionally, you may also need to check between 2:00 a.m. and 3:00 a.m., as directed.  Also check your blood glucose: ? Before and after exercise. ? Before potentially dangerous tasks, like driving or using heavy machinery.  You may need to check your blood glucose more often if: ? Your medicine is being adjusted. ? Your diabetes is not well-controlled. ? You are ill.  What is a blood glucose log?  A blood glucose log is a record of your blood glucose readings. It helps you and your health care provider: ? Look for patterns in your blood glucose over time. ? Adjust your diabetes management plan  as needed.  Every time you check your blood glucose, write down your result and notes about things that may be affecting your blood glucose, such as your diet and exercise for the day.  Most  glucose meters store a record of glucose readings in the meter. Some meters allow you to download your records to a computer. How do I check my blood glucose? Follow these steps to get accurate readings of your blood glucose: Supplies needed   Blood glucose meter.  Test strips for your meter. Each meter has its own strips. You must use the strips that come with your meter.  A needle to prick your finger (lancet). Do not use lancets more than once.  A device that holds the lancet (lancing device).  A journal or log book to write down your results.  Procedure  Wash your hands with soap and water.  Prick the side of your finger (not the tip) with the lancet. Use a different finger each time.  Gently rub the finger until a small drop of blood appears.  Follow instructions that come with your meter for inserting the test strip, applying blood to the strip, and using your blood glucose meter.  Write down your result and any notes.  Alternative testing sites  Some meters allow you to use areas of your body other than your finger (alternative sites) to test your blood.  If you think you may have hypoglycemia, or if you have hypoglycemia unawareness, do not use alternative sites. Use your finger instead.  Alternative sites may not be as accurate as the fingers, because blood flow is slower in these areas. This means that the result you get may be delayed, and it may be different from the result that you would get from your finger.  The most common alternative sites are: ? Forearm. ? Thigh. ? Palm of the hand.  Additional tips  Always keep your supplies with you.  If you have questions or need help, all blood glucose meters have a 24-hour "hotline" number that you can call. You may also contact your health care provider.  After you use a few boxes of test strips, adjust (calibrate) your blood glucose meter by following instructions that came with your meter.    The American  Diabetes Association suggests the following targets for most nonpregnant adults with diabetes.  More or less stringent glycemic goals may be appropriate for each individual.  A1C: Less than 7% A1C may also be reported as eAG: Less than 154 mg/dl Before a meal (preprandial plasma glucose): 80-130 mg/dl 1-2 hours after beginning of the meal (Postprandial plasma glucose)*: Less than 180 mg/dl  *Postprandial glucose may be targeted if A1C goals are not met despite reaching preprandial glucose goals.   GOALS in short:  The goals are for the Hgb A1C to be less than 7.0 & blood pressure to be less than 130/80.    It is recommended that all diabetics are educated on and follow a healthy diabetic diet, exercise for 30 minutes 3-4 times per week (walking, biking, swimming, or machine), monitor blood glucose readings and bring that record with you to be reviewed at your next office visit.     You should be checking fasting blood sugars- especially after you eat poorly or eat really healthy, and also check 2 hour postprandial blood sugars after largest meal of the day.    Write these down and bring in your log at each office visit.    You will  need to be seen every 3 months by the provider managing your Diabetes unless told otherwise by that provider.   You will need yearly eye exams from an eye specialist and foot exams to check the nerves of your feet.  Also, your urine should be checked yearly as well to make sure excess protein is not present.   If you are checking your blood pressure at home, please record it and bring it to your next office visit.    Follow the Dietary Approaches to Stop Hypertension (DASH) diet (3 servings of fruit and vegetables daily, whole grains, low sodium, low-fat proteins).  See below.    Lastly, when it comes to your cholesterol, the goal is to have the HDL (good cholesterol) >40, and the LDL (bad cholesterol) <100.   It is recommended that you follow a heart healthy, low  saturated and trans-fat diet and exercise for 30 minutes at least 5 times a week.     (( Check out the DASH diet = 1.5 Gram Low Sodium Diet   A 1.5 gram sodium diet restricts the amount of sodium in the diet to no more than 1.5 g or 1500 mg daily.  The American Heart Association recommends Americans over the age of 72 to consume no more than 1500 mg of sodium each day to reduce the risk of developing high blood pressure.  Research also shows that limiting sodium may reduce heart attack and stroke risk.  Many foods contain sodium for flavor and sometimes as a preservative.  When the amount of sodium in a diet needs to be low, it is important to know what to look for when choosing foods and drinks.  The following includes some information and guidelines to help make it easier for you to adapt to a low sodium diet.    QUICK TIPS  Do not add salt to food.  Avoid convenience items and fast food.  Choose unsalted snack foods.  Buy lower sodium products, often labeled as "lower sodium" or "no salt added."  Check food labels to learn how much sodium is in 1 serving.  When eating at a restaurant, ask that your food be prepared with less salt or none, if possible.    READING FOOD LABELS FOR SODIUM INFORMATION  The nutrition facts label is a good place to find how much sodium is in foods. Look for products with no more than 400 mg of sodium per serving.  Remember that 1.5 g = 1500 mg.  The food label may also list foods as:  Sodium-free: Less than 5 mg in a serving.  Very low sodium: 35 mg or less in a serving.  Low-sodium: 140 mg or less in a serving.  Light in sodium: 50% less sodium in a serving. For example, if a food that usually has 300 mg of sodium is changed to become light in sodium, it will have 150 mg of sodium.  Reduced sodium: 25% less sodium in a serving. For example, if a food that usually has 400 mg of sodium is changed to reduced sodium, it will have 300 mg of sodium.    CHOOSING  FOODS  Grains  Avoid: Salted crackers and snack items. Some cereals, including instant hot cereals. Bread stuffing and biscuit mixes. Seasoned rice or pasta mixes.  Choose: Unsalted snack items. Low-sodium cereals, oats, puffed wheat and rice, shredded wheat. English muffins and bread. Pasta.  Meats  Avoid: Salted, canned, smoked, spiced, pickled meats, including fish and poultry. Berniece Salines,  ham, sausage, cold cuts, hot dogs, anchovies.  Choose: Low-sodium canned tuna and salmon. Fresh or frozen meat, poultry, and fish.  Dairy  Avoid: Processed cheese and spreads. Cottage cheese. Buttermilk and condensed milk. Regular cheese.  Choose: Milk. Low-sodium cottage cheese. Yogurt. Sour cream. Low-sodium cheese.  Fruits and Vegetables  Avoid: Regular canned vegetables. Regular canned tomato sauce and paste. Frozen vegetables in sauces. Olives. Angie Fava. Relishes. Sauerkraut.  Choose: Low-sodium canned vegetables. Low-sodium tomato sauce and paste. Frozen or fresh vegetables. Fresh and frozen fruit.  Condiments  Avoid: Canned and packaged gravies. Worcestershire sauce. Tartar sauce. Barbecue sauce. Soy sauce. Steak sauce. Ketchup. Onion, garlic, and table salt. Meat flavorings and tenderizers.  Choose: Fresh and dried herbs and spices. Low-sodium varieties of mustard and ketchup. Lemon juice. Tabasco sauce. Horseradish.    SAMPLE 1.5 GRAM SODIUM MEAL PLAN:   Breakfast / Sodium (mg)  1 cup low-fat milk / 143 mg  1 whole-wheat English muffin / 240 mg  1 tbs heart-healthy margarine / 153 mg  1 hard-boiled egg / 139 mg  1 small orange / 0 mg  Lunch / Sodium (mg)  1 cup raw carrots / 76 mg  2 tbs no salt added peanut butter / 5 mg  2 slices whole-wheat bread / 270 mg  1 tbs jelly / 6 mg   cup red grapes / 2 mg  Dinner / Sodium (mg)  1 cup whole-wheat pasta / 2 mg  1 cup low-sodium tomato sauce / 73 mg  3 oz lean ground beef / 57 mg  1 small side salad (1 cup raw spinach leaves,  cup cucumber,   cup yellow bell pepper) with 1 tsp olive oil and 1 tsp red wine vinegar / 25 mg  Snack / Sodium (mg)  1 container low-fat vanilla yogurt / 107 mg  3 graham cracker squares / 127 mg  Nutrient Analysis  Calories: 1745  Protein: 75 g  Carbohydrate: 237 g  Fat: 57 g  Sodium: 1425 mg  Document Released: 04/19/2005 Document Revised: 12/30/2010 Document Reviewed: 07/21/2009  ExitCare Patient Information 2012 [place].))    This information is not intended to replace advice given to you by your health care provider. Make sure you discuss any questions you have with your health care provider. Document Released: 04/22/2003 Document Revised: 11/07/2015 Document Reviewed: 09/29/2015 Elsevier Interactive Patient Education  2017 [employer].

## 2019-05-30 ENCOUNTER — Ambulatory Visit (INDEPENDENT_AMBULATORY_CARE_PROVIDER_SITE_OTHER): Payer: BC Managed Care – PPO | Admitting: Gastroenterology

## 2019-05-30 ENCOUNTER — Encounter: Payer: Self-pay | Admitting: Gastroenterology

## 2019-05-30 ENCOUNTER — Other Ambulatory Visit: Payer: Self-pay

## 2019-05-30 VITALS — BP 146/70 | HR 80 | Temp 98.6°F | Ht 70.0 in | Wt 238.4 lb

## 2019-05-30 DIAGNOSIS — R933 Abnormal findings on diagnostic imaging of other parts of digestive tract: Secondary | ICD-10-CM | POA: Diagnosis not present

## 2019-05-30 DIAGNOSIS — D126 Benign neoplasm of colon, unspecified: Secondary | ICD-10-CM | POA: Diagnosis not present

## 2019-05-30 DIAGNOSIS — D696 Thrombocytopenia, unspecified: Secondary | ICD-10-CM | POA: Diagnosis not present

## 2019-05-30 DIAGNOSIS — K635 Polyp of colon: Secondary | ICD-10-CM | POA: Diagnosis not present

## 2019-05-30 DIAGNOSIS — Z23 Encounter for immunization: Secondary | ICD-10-CM | POA: Diagnosis not present

## 2019-05-30 NOTE — Progress Notes (Signed)
[place] [facility] VISIT   Primary Care Provider Mellody Dance, [place] [address] [phone]  Referring Provider Dr. Havery Moros  Patient Profile: [name] is a 54 y.o. male with a pmh significant for diabetes, hypertension, idiopathic thrombocytopenia, undifferentiated cirrhosis, colon polyps (large cecal/IC valve TVA).  The patient presents to the [facility] for an evaluation and management of problem(s) noted below:  Problem List 1. Cecal polyp   2. Tubulovillous adenoma of colon   3. Abnormal colonoscopy   4. Thrombocytopenia ([place])     History of Present Illness Please see initial consultation note from Dr. Havery Moros for full details of HPI.  Interval History The patient underwent an upper endoscopy and lower endoscopy with Dr. Havery Moros for further work-up of his chronic GI symptoms.  He had no evidence of varices.  Candida esophagitis was found.  A large cecal polyp extending to the IC valve was found with biopsies showing evidence of TVA and inability to rule out high-grade dysplasia due to the paucity of sample.  Is for this reason the patient is referred for consideration of advanced endoscopic evaluation and potential resection.  Patient's GI symptoms are stable since his recent colonoscopy and upper endoscopy.  He was to be treated for his candidal esophagitis.  Patient does not have any severe abdominal discomfort or pains post procedure other than what was previously described to Dr. Havery Moros and initial consultation.  He has a longer standing issue with bruising but not significant bleeding which was felt to be due to ITP though based on Dr. Doyne Keel discussion is probably more likely a result of underlying undifferentiated cirrhosis.  No evidence of melena or hematochezia.  GI Review of Systems Positive as above Negative for dysphagia, change in bowel habits  Review of Systems General:  Denies fevers/chills/weight loss HEENT: Denies oral lesions Cardiovascular: Denies chest pain/palpitations Pulmonary: Denies shortness of breath Gastroenterological: See HPI Genitourinary: Denies darkened urine or hematuria Hematological: Positive for easy bruising  Dermatological: Denies jaundice Psychological: Mood is stable   Medications Current Outpatient Medications  Medication Sig Dispense Refill  . atorvastatin (LIPITOR) 20 MG tablet Take 1 tablet (20 mg total) by mouth at bedtime. 90 tablet 0  . cyclobenzaprine (FLEXERIL) 10 MG tablet Take 1 tablet (10 mg total) by mouth 3 (three) times daily as needed for muscle spasms. 30 tablet 0  . gabapentin (NEURONTIN) 600 MG tablet Take 2 tablets (1,200 mg total) by mouth 3 (three) times daily. Max 3,600 mg per day 540 tablet 0  . metFORMIN (GLUCOPHAGE) 500 MG tablet Take 2 tablets (1,000 mg total) by mouth 2 (two) times daily with a meal. 60 tablet 2  . Olmesartan-amLODIPine-HCTZ 20-5-12.5 MG TABS Take by mouth daily.    Marland Kitchen omeprazole (PRILOSEC) 40 MG capsule Take 1 capsule (40 mg total) by mouth daily. 30 capsule 3  . ondansetron (ZOFRAN ODT) 4 MG disintegrating tablet Take 1 tablet (4 mg total) by mouth every 6 (six) hours as needed for nausea or vomiting. 30 tablet 1  . Vitamin D, Ergocalciferol, (DRISDOL) 1.25 MG (50000 UT) CAPS capsule TAKE 1 CAPSULE BY MOUTH TWO TIMES A WEEK.  [facility] AND SUNDAY 24 capsule 3  . Na Sulfate-K Sulfate-Mg Sulf (SUPREP BOWEL PREP KIT) 17.5-3.13-1.6 GM/177ML SOLN Take 1 kit by mouth as directed. For colonoscopy prep 354 mL 0   No current facility-administered medications for this visit.    Allergies No Known Allergies  Histories Past Medical History:  Diagnosis Date  .  Diabetes mellitus without complication ([place])   . Hypertension   . Thrombocytopenia ([place])    Past Surgical History:  Procedure Laterality Date  . APPENDECTOMY    . BACK SURGERY     Social History   Socioeconomic History  .  Marital status: Married    Spouse name: Not on file  . Number of children: 1  . Years of education: Not on file  . Highest education level: Not on file  Occupational History  . Occupation: unemployed  Tobacco Use  . Smoking status: Former Smoker    Quit date: 06/23/1995    Years since quitting: 23.9  . Smokeless tobacco: Current User    Types: Chew  Substance and Sexual Activity  . Alcohol use: Yes    Comment: 3 drinks a month  . Drug use: No  . Sexual activity: Yes    Birth control/protection: None  Other Topics Concern  . Not on file  Social History Narrative  . Not on file   Social Determinants of Health   Financial Resource Strain:   . Difficulty of Paying Living Expenses: Not on file  Food Insecurity:   . Worried About Charity fundraiser in the Last Year: Not on file  . Ran Out of Food in the Last Year: Not on file  Transportation Needs:   . Lack of Transportation (Medical): Not on file  . Lack of Transportation (Non-Medical): Not on file  Physical Activity:   . Days of Exercise per Week: Not on file  . Minutes of Exercise per Session: Not on file  Stress:   . Feeling of Stress : Not on file  Social Connections:   . Frequency of Communication with Friends and Family: Not on file  . Frequency of Social Gatherings with Friends and Family: Not on file  . Attends Religious Services: Not on file  . Active Member of Clubs or Organizations: Not on file  . Attends Archivist Meetings: Not on file  . Marital Status: Not on file  Intimate Partner Violence:   . Fear of Current or Ex-Partner: Not on file  . Emotionally Abused: Not on file  . Physically Abused: Not on file  . Sexually Abused: Not on file   Family History  Problem Relation Age of Onset  . Brain cancer Mother   . Diabetes Father   . Colon cancer Neg Hx   . Liver cancer Neg Hx   . Esophageal cancer Neg Hx   . Inflammatory bowel disease Neg Hx   . Liver disease Neg Hx   . Pancreatic cancer  Neg Hx   . Rectal cancer Neg Hx   . Stomach cancer Neg Hx    I have reviewed his medical, social, and family history in detail and updated the electronic medical record as necessary.    PHYSICAL EXAMINATION  BP (!) 146/70 (BP Location: Left Arm, Patient Position: Sitting, Cuff Size: Normal)   Pulse 80   Temp 98.6 F (37 C)   Ht _0  (1.778 m) Comment: height measured without shoes  Wt 238 lb 6 oz (108.1 kg)   BMI 34.20 kg/m  Wt Readings from Last 3 Encounters:  05/30/19 238 lb 6 oz (108.1 kg)  05/29/19 242 lb 8 oz (110 kg)  05/17/19 239 lb (108.4 kg)  GEN: NAD, appears stated age, doesn't appear chronically ill PSYCH: Cooperative, without pressured speech EYE: Conjunctivae pink, sclerae anicteric CV: RR without R/Gs  RESP: CTAB posteriorly, without wheezing GI: NABS,  soft, mildly protuberant abdomen, NT/ND, without rebound or guarding MSK/EXT: No lower extremity edema SKIN: No jaundice NEURO:  Alert & Oriented x 3, no focal deficits, no evidence of asterixis   REVIEW OF DATA  I reviewed the following data at the time of this encounter:  GI Procedures and Studies  January 2021 colonoscopy - Preparation of the colon was inadequate for screening purposes. - The examined portion of the ileum was normal. - One large laterally spreading polyp in the right colon as described. Biopsied, hopefully this is just an advanced adenoma. I do think it may be endoscopically resectable but would need EMR by advanced endoscopy in the hospital setting. - Normal colon otherwise - left colon not well visualized as above due to stool burden. 3. Surgical [P], right colon polyp - SUPERFICIAL FRAGMENTS OF TUBULOVILLOUS ADENOMA. SEE NOTE  Laboratory Studies  Reviewed those in epic  Imaging Studies  No relevant studies to review   ASSESSMENT  [name] is a 54 y.o. male with a pmh significant for diabetes, hypertension, idiopathic thrombocytopenia, undifferentiated cirrhosis, colon polyps  (large cecal/IC valve TVA).  The patient is seen today for evaluation and management of:  1. Cecal polyp   2. Tubulovillous adenoma of colon   3. Abnormal colonoscopy   4. Thrombocytopenia ([place])    The patient is hemodynamically stable.  Based upon the description and endoscopic pictures I do feel that it is reasonable to pursue an Advanced Polypectomy attempt of the polyp/lesion.  We discussed some of the techniques of advanced polypectomy which include Endoscopic Mucosal Resection, OVESCO Full-Thickness Resection, Endorotor Morcellation, and Tissue Ablation via Fulguration.  The risks and benefits of endoscopic evaluation were discussed with the patient; these include but are not limited to the risk of perforation, infection, bleeding, missed lesions, lack of diagnosis, severe illness requiring hospitalization, as well as anesthesia and sedation related illnesses.  During attempts at advanced resection, the risks of bleeding and perforation/leak are increased as opposed to diagnostic and screening procedures, and that was discussed with the patient as well.   In addition, as a result of the patient's underlying thrombocytopenia likely a result of his underlying cirrhosis, his bleeding risk will be higher (thankfully platelet count greater than 50,000 thus no eltrombopag or platelet transfusion required at this time).  In addition, I explained that with the possible need for piecemeal resection, subsequent short-interval endoscopic evaluation for follow up and potential retreatment of the lesion/area may be necessary.  I did offer, a referral to surgery in order for patient to have opportunity to discuss surgical management/intervention prior to finalizing decision for attempt at endoscopic removal, however, the patient deferred on this.  If, after attempt at removal of the polyp/lesion, it is found that the patient has a complication or that an invasive lesion or malignant lesion is found, or that the  polyp/lesion continues to recur, the patient is aware and understands that surgery may still be indicated/required.  All patient questions were answered, to the best of my ability, and the patient agrees to the aforementioned plan of action with follow-up as indicated.   PLAN  Proceed with scheduling colonoscopy with EMR Reviewed prior labs and he is up-to-date and does not need preprocedure labs to be redrawn Further work-up/follow-up as per Dr. Havery Moros from a GI/oncology perspective   Orders Placed This Encounter  Procedures  . Procedural/ Surgical Case Request: COLONOSCOPY WITH PROPOFOL, ENDOSCOPIC MUCOSAL RESECTION  . Heplisav-B (HepB-CPG) Vaccine  . Ambulatory referral to Gastroenterology  New Prescriptions   NA SULFATE-K SULFATE-MG SULF (SUPREP BOWEL PREP KIT) 17.5-3.13-1.6 GM/177ML SOLN    Take 1 kit by mouth as directed. For colonoscopy prep   Modified Medications   No medications on file    Planned Follow Up No follow-ups on file.   Total Time in Face-to-Face and in Coordination of Care for patient including independent/personal interpretation/review of prior testing, medical history, examination, medication adjustment, communicating results with the patient directly, and documentation with the EHR is greater than 30 minutes.   Justice Britain, MD [facility] Advanced Endoscopy Office # [phone]

## 2019-05-30 NOTE — Patient Instructions (Addendum)
You have been scheduled for a colonoscopy. Please follow written instructions given to you at your visit today.  Please pick up your prep supplies at the pharmacy within the next 1-3 days. If you use inhalers (even only as needed), please bring them with you on the day of your procedure. .  If you are age 52 or older, your body mass index should be between 23-30. Your Body mass index is 34.2 kg/m. If this is out of the aforementioned range listed, please consider follow up with your Primary Care Provider.  If you are age 51 or younger, your body mass index should be between 19-25. Your Body mass index is 34.2 kg/m. If this is out of the aformentioned range listed, please consider follow up with your Primary Care Provider.    Thank you for choosing me and [facility].  Dr. Rush Landmark

## 2019-05-31 MED ORDER — SUPREP BOWEL PREP KIT 17.5-3.13-1.6 GM/177ML PO SOLN: 1.0000 | ORAL | 0 refills | Status: DC

## 2019-06-02 DIAGNOSIS — D126 Benign neoplasm of colon, unspecified: Secondary | ICD-10-CM | POA: Insufficient documentation

## 2019-06-02 DIAGNOSIS — K635 Polyp of colon: Secondary | ICD-10-CM | POA: Insufficient documentation

## 2019-06-02 DIAGNOSIS — R933 Abnormal findings on diagnostic imaging of other parts of digestive tract: Secondary | ICD-10-CM | POA: Insufficient documentation

## 2019-06-28 ENCOUNTER — Telehealth: Payer: Self-pay | Admitting: Gastroenterology

## 2019-06-28 ENCOUNTER — Other Ambulatory Visit (HOSPITAL_COMMUNITY)
Admission: RE | Admit: 2019-06-28 | Discharge: 2019-06-28 | Disposition: A | Discharge status: Home / Self Care | Payer: BC Managed Care – PPO | Source: Ambulatory Visit | Attending: Gastroenterology | Admitting: Gastroenterology

## 2019-06-28 DIAGNOSIS — Z01812 Encounter for preprocedural laboratory examination: Secondary | ICD-10-CM | POA: Diagnosis present

## 2019-06-28 DIAGNOSIS — Z20822 Contact with and (suspected) exposure to covid-19: Secondary | ICD-10-CM | POA: Insufficient documentation

## 2019-06-28 LAB — SARS CORONAVIRUS 2 (TAT 6-24 HRS): SARS Coronavirus 2: NEGATIVE

## 2019-06-28 MED ORDER — SUPREP BOWEL PREP KIT 17.5-3.13-1.6 GM/177ML PO SOLN: 1.0000 | ORAL | 0 refills | Status: DC

## 2019-06-28 NOTE — Telephone Encounter (Signed)
Pt would like to discuss prep for upcoming hospital colonoscopy 07/02/19.  He stated that he had spoken to you about tablets instead of Suprep.

## 2019-06-28 NOTE — Telephone Encounter (Signed)
Called pt and advised him of the conversation that we had in office at his visit. Pt was given the option of Suprep or Sutab. Pt chose Suprep. I informed pt that I would be more than happen to change prep and instructions if he wanted to , but after explaining instructions for Suprep again with pt, he decided that he would like to keep Suprep.

## 2019-06-29 ENCOUNTER — Encounter (HOSPITAL_COMMUNITY): Payer: Self-pay | Admitting: Gastroenterology

## 2019-06-29 ENCOUNTER — Telehealth: Payer: Self-pay | Admitting: Gastroenterology

## 2019-06-29 ENCOUNTER — Other Ambulatory Visit: Payer: Self-pay

## 2019-06-29 MED ORDER — SUTAB 1479-225-188 MG PO TABS: 24.0000 | ORAL_TABLET | ORAL | 0 refills | Status: DC

## 2019-06-29 NOTE — Telephone Encounter (Signed)
Returned pt's call. I sent rx with coupon code for Suprep. Called pharmacy to make sure they recd it. Pt informed.

## 2019-06-29 NOTE — Progress Notes (Signed)
Spoke with pt for pre-op call. Pt denies cardiac history. Pt is a type 2 Diabetic. Last A1C was 8.1 on  05/29/19. Pt states he occasionally checks his fasting blood sugar and it's usually in the 120's or 130's. Pt instructed not to take his Metformin the morning of the procedure. Instructed to check his blood sugar when he gets up Monday AM. If blood sugar is 70 or below, treat with 1/2 cup of clear juice (apple or cranberry) and recheck blood sugar 15 minutes after drinking juice. Instructed pt to let the nurse know on arrival Monday if he had to drink the juice. Pt voiced understanding.  Covid test done 06/28/19 and it's negative. Pt states he's been in quarantine since the test was done and understands that the quarantine continues until he gets to the hospital on Monday.  EKG - 02/15/19 in Epic

## 2019-07-01 ENCOUNTER — Telehealth: Payer: Self-pay | Admitting: Gastroenterology

## 2019-07-01 NOTE — Telephone Encounter (Addendum)
Patient's wife called this evening. He is scheduled for colonoscopy with EMR with Dr. Rush Landmark for large right sided polyp noted on recent colonoscopy. He drank the entire first bottle of Suprep, states he vomited the entire contents, has not had a bowel movement yet. I discussed options with them moving forward. His prep was not good during his initial exam.  Will attempt Miralax prep this evening along with dulcolax. Gave instructions to the patient's wife on how to do this and she understood. Verbalized that he must drink the whole thing, okay to go slow to make sure he tolerates it. If he drinks it okay but not entirely clear, can re-try the second dose of Suprep if needed in the AM but cautioned him to drink very slowly and cold, and hopefully better tolerated.   Wynetta Fines

## 2019-07-02 ENCOUNTER — Telehealth: Payer: Self-pay

## 2019-07-02 ENCOUNTER — Ambulatory Visit (HOSPITAL_COMMUNITY): Payer: BC Managed Care – PPO | Admitting: Certified Registered Nurse Anesthetist

## 2019-07-02 ENCOUNTER — Encounter (HOSPITAL_COMMUNITY): Payer: Self-pay | Admitting: Gastroenterology

## 2019-07-02 ENCOUNTER — Other Ambulatory Visit: Payer: Self-pay

## 2019-07-02 ENCOUNTER — Ambulatory Visit (HOSPITAL_COMMUNITY)
Admission: RE | Admit: 2019-07-02 | Discharge: 2019-07-02 | Disposition: A | Discharge status: Home / Self Care | Payer: BC Managed Care – PPO | Attending: Gastroenterology | Admitting: Gastroenterology

## 2019-07-02 ENCOUNTER — Encounter (HOSPITAL_COMMUNITY)
Admission: RE | Disposition: A | Discharge status: Home / Self Care | Payer: Self-pay | Source: Home / Self Care | Attending: Gastroenterology

## 2019-07-02 DIAGNOSIS — K641 Second degree hemorrhoids: Secondary | ICD-10-CM | POA: Diagnosis not present

## 2019-07-02 DIAGNOSIS — I10 Essential (primary) hypertension: Secondary | ICD-10-CM | POA: Diagnosis not present

## 2019-07-02 DIAGNOSIS — G4730 Sleep apnea, unspecified: Secondary | ICD-10-CM | POA: Diagnosis not present

## 2019-07-02 DIAGNOSIS — D122 Benign neoplasm of ascending colon: Secondary | ICD-10-CM | POA: Insufficient documentation

## 2019-07-02 DIAGNOSIS — Z87891 Personal history of nicotine dependence: Secondary | ICD-10-CM | POA: Insufficient documentation

## 2019-07-02 DIAGNOSIS — I739 Peripheral vascular disease, unspecified: Secondary | ICD-10-CM | POA: Insufficient documentation

## 2019-07-02 DIAGNOSIS — K644 Residual hemorrhoidal skin tags: Secondary | ICD-10-CM | POA: Diagnosis not present

## 2019-07-02 DIAGNOSIS — K635 Polyp of colon: Secondary | ICD-10-CM

## 2019-07-02 HISTORY — PX: COLONOSCOPY WITH PROPOFOL: SHX5780

## 2019-07-02 HISTORY — DX: Gastro-esophageal reflux disease without esophagitis: K21.9

## 2019-07-02 HISTORY — DX: Sleep apnea, unspecified: G47.30

## 2019-07-02 LAB — GLUCOSE, CAPILLARY: Glucose-Capillary: 169 mg/dL — ABNORMAL HIGH (ref 70–99)

## 2019-07-02 SURGERY — COLONOSCOPY WITH PROPOFOL
Anesthesia: Monitor Anesthesia Care

## 2019-07-02 MED ORDER — LACTATED RINGERS IV SOLN: INTRAVENOUS | Status: DC | PRN

## 2019-07-02 MED ORDER — ONDANSETRON 4 MG PO TBDP: 4.0000 mg | ORAL_TABLET | Freq: Four times a day (QID) | ORAL | 1 refills | Status: DC | PRN

## 2019-07-02 MED ORDER — SODIUM CHLORIDE 0.9 % IV SOLN: INTRAVENOUS | Status: DC

## 2019-07-02 MED ORDER — PROPOFOL 500 MG/50ML IV EMUL
INTRAVENOUS | Status: DC | PRN
  Administered 2019-07-02: 100 ug/kg/min via INTRAVENOUS

## 2019-07-02 MED ORDER — LIDOCAINE 2% (20 MG/ML) 5 ML SYRINGE
INTRAMUSCULAR | Status: DC | PRN
  Administered 2019-07-02: 100 mg via INTRAVENOUS

## 2019-07-02 MED ORDER — PROPOFOL 10 MG/ML IV BOLUS
INTRAVENOUS | Status: DC | PRN
  Administered 2019-07-02: 60 mg via INTRAVENOUS

## 2019-07-02 SURGICAL SUPPLY — 22 items

## 2019-07-02 NOTE — Discharge Instructions (Signed)

## 2019-07-02 NOTE — Anesthesia Preprocedure Evaluation (Addendum)
Anesthesia Evaluation  Patient identified by MRN, date of birth, ID band Patient awake    Reviewed: Allergy & Precautions, NPO status , Patient's Chart, lab work & pertinent test results  Airway Mallampati: I  TM Distance: >3 FB Neck ROM: Full    Dental no notable dental hx. (+) Teeth Intact, Dental Advisory Given   Pulmonary sleep apnea and Continuous Positive Airway Pressure Ventilation , former smoker,    Pulmonary exam normal breath sounds clear to auscultation       Cardiovascular hypertension, Pt. on medications and Pt. on home beta blockers + Peripheral Vascular Disease  Normal cardiovascular exam Rhythm:Regular Rate:Normal     Neuro/Psych negative neurological ROS  negative psych ROS   GI/Hepatic Neg liver ROS, hiatal hernia, GERD  ,  Endo/Other  negative endocrine ROSdiabetes, Type 2  Renal/GU negative Renal ROS  negative genitourinary   Musculoskeletal negative musculoskeletal ROS (+)   Abdominal   Peds  Hematology negative hematology ROS (+)   Anesthesia Other Findings   Reproductive/Obstetrics                            Anesthesia Physical Anesthesia Plan  ASA: III  Anesthesia Plan: MAC   Post-op Pain Management:    Induction: Intravenous  PONV Risk Score and Plan: 1 and Propofol infusion and Treatment may vary due to age or medical condition  Airway Management Planned: Natural Airway  Additional Equipment:   Intra-op Plan:   Post-operative Plan:   Informed Consent: I have reviewed the patients History and Physical, chart, labs and discussed the procedure including the risks, benefits and alternatives for the proposed anesthesia with the patient or authorized representative who has indicated his/her understanding and acceptance.     Dental advisory given  Plan Discussed with: CRNA  Anesthesia Plan Comments:         Anesthesia Quick Evaluation

## 2019-07-02 NOTE — Telephone Encounter (Signed)
-----   Message from Irving Copas., MD sent at 07/02/2019  8:48 AM EST ----- SA, patient not adequately prepared for the resection attempt so we aborted. Fontella Shan, please work on scheduling a 2-hour slot for patient.  2-day preparation and Miralax week before as outlined on the note. Let SA and I know when patient decides to have the procedure.  He needs to be done within the next 4-6 weeks. Thanks. GM

## 2019-07-02 NOTE — Telephone Encounter (Signed)
Have seen and discussed with patient. Was able to tolerate Miralax and then Suprep. Not sure that he is completely clean for a true EMR, but we are going to go in and look. He understands that he may need to be postponed if the preparation is not adequate. Will keep you UTD.  Justice Britain, MD [facility] Advanced Endoscopy Office # [phone]

## 2019-07-02 NOTE — Anesthesia Postprocedure Evaluation (Signed)
Anesthesia Post Note  Patient: [name]  Procedure(s) Performed: COLONOSCOPY WITH PROPOFOL (N/A )     Patient location during evaluation: PACU Anesthesia Type: MAC Level of consciousness: awake and alert Pain management: pain level controlled Vital Signs Assessment: post-procedure vital signs reviewed and stable Respiratory status: spontaneous breathing, nonlabored ventilation, respiratory function stable and patient connected to nasal cannula oxygen Cardiovascular status: stable and blood pressure returned to baseline Postop Assessment: no apparent nausea or vomiting Anesthetic complications: no    Last Vitals:  Vitals:   07/02/19 0829 07/02/19 0843  BP: 136/86 (!) 145/79  Pulse: 90 79  Resp: 20 18  Temp: (!) 36.1 C 36.5 C  SpO2: 97% 98%    Last Pain:  Vitals:   07/02/19 0843  TempSrc:   PainSc: 0-No pain                 Brodrick Curran L Blaine Hari

## 2019-07-02 NOTE — H&P (Signed)
GASTROENTEROLOGY PROCEDURE H&P NOTE   Primary Care Physician: Mellody Dance, DO  HPI: [name] is a 54 y.o. male who presents for Colonoscopy with EMR attempt of large AC/Cecal/ICV adenoma.  Past Medical History:  Diagnosis Date  . Diabetes mellitus without complication ([place])   . GERD (gastroesophageal reflux disease)    as a child, some as an adult  . Hypertension   . Sleep apnea    does not use Cpap  . Thrombocytopenia ([place])    Past Surgical History:  Procedure Laterality Date  . APPENDECTOMY    . BACK SURGERY    . COLONOSCOPY     Current Facility-Administered Medications  Medication Dose Route Frequency Provider Last Rate Last Admin  . 0.9 %  sodium chloride infusion   Intravenous Continuous Mansouraty, Telford Nab., MD       No Known Allergies Family History  Problem Relation Age of Onset  . Brain cancer Mother   . Diabetes Father   . Colon cancer Neg Hx   . Liver cancer Neg Hx   . Esophageal cancer Neg Hx   . Inflammatory bowel disease Neg Hx   . Liver disease Neg Hx   . Pancreatic cancer Neg Hx   . Rectal cancer Neg Hx   . Stomach cancer Neg Hx    Social History   Socioeconomic History  . Marital status: Married    Spouse name: Not on file  . Number of children: 1  . Years of education: Not on file  . Highest education level: Not on file  Occupational History  . Occupation: unemployed  Tobacco Use  . Smoking status: Former Smoker    Quit date: 06/23/1995    Years since quitting: 24.0  . Smokeless tobacco: Current User    Types: Chew  Substance and Sexual Activity  . Alcohol use: Yes    Comment: 3 drinks a month  . Drug use: Yes    Types: Marijuana  . Sexual activity: Yes    Birth control/protection: None  Other Topics Concern  . Not on file  Social History Narrative  . Not on file   Social Determinants of Health   Financial Resource Strain:   . Difficulty of Paying Living Expenses: Not on file  Food Insecurity:   . Worried  About Charity fundraiser in the Last Year: Not on file  . Ran Out of Food in the Last Year: Not on file  Transportation Needs:   . Lack of Transportation (Medical): Not on file  . Lack of Transportation (Non-Medical): Not on file  Physical Activity:   . Days of Exercise per Week: Not on file  . Minutes of Exercise per Session: Not on file  Stress:   . Feeling of Stress : Not on file  Social Connections:   . Frequency of Communication with Friends and Family: Not on file  . Frequency of Social Gatherings with Friends and Family: Not on file  . Attends Religious Services: Not on file  . Active Member of Clubs or Organizations: Not on file  . Attends Archivist Meetings: Not on file  . Marital Status: Not on file  Intimate Partner Violence:   . Fear of Current or Ex-Partner: Not on file  . Emotionally Abused: Not on file  . Physically Abused: Not on file  . Sexually Abused: Not on file    Physical Exam: Vital signs in last 24 hours: Temp:  [98 F (36.7 C)] 98 F (36.7  C) (03/01 0645) Resp:  [18] 18 (03/01 0645) BP: (143)/(76) 143/76 (03/01 0645) SpO2:  [100 %] 100 % (03/01 0645) Weight:  [108.9 kg] 108.9 kg (03/01 0645)   GEN: NAD EYE: Sclerae anicteric ENT: MMM CV: Non-tachycardic GI: Soft, NT/ND NEURO:  Alert & Oriented x 3  Lab Results: No results for input(s): WBC, HGB, HCT, PLT in the last 72 hours. BMET No results for input(s): NA, K, CL, CO2, GLUCOSE, BUN, CREATININE, CALCIUM in the last 72 hours. LFT No results for input(s): PROT, ALBUMIN, AST, ALT, ALKPHOS, BILITOT, BILIDIR, IBILI in the last 72 hours. PT/INR No results for input(s): LABPROT, INR in the last 72 hours.   Impression / Plan: This is a 54 y.o.male who presents for Colonoscopy with attempt at EMR of large AC/Cecal/ICV adenoma.    Patient did not tolerate the complete preparation and is having lighter bowel movements but not sure if clear as of yet.  Discussed that if preparation in  area is not adequate we may not be able to perform resection but will have to see.   The risks and benefits of endoscopic evaluation were discussed with the patient; these include but are not limited to the risk of perforation, infection, bleeding, missed lesions, lack of diagnosis, severe illness requiring hospitalization, as well as anesthesia and sedation related illnesses.  The patient is agreeable to proceed.    Justice Britain, MD [facility] Advanced Endoscopy Office # [phone]

## 2019-07-02 NOTE — Op Note (Signed)
[facility] Patient Name: [name] Procedure Date : 07/02/2019 MRN: [id number] Attending MD: Justice Britain , MD Date of Birth: May 17, 1965 CSN: [id number] Age: 54 Admit Type: Outpatient Procedure:                Colonoscopy Indications:              Excision of colonic polyp Providers:                Justice Britain, MD, Burtis Junes, RN, Elspeth Cho Tech., Technician, Lillie Fragmin, CRNA Referring MD:             Carlota Raspberry. Havery Moros, MD Medicines:                Monitored Anesthesia Care Complications:            No immediate complications. Estimated Blood Loss:     Estimated blood loss: none. Procedure:                Pre-Anesthesia Assessment:                           - Prior to the procedure, a History and Physical                            was performed, and patient medications and                            allergies were reviewed. The patient's tolerance of                            previous anesthesia was also reviewed. The risks                            and benefits of the procedure and the sedation                            options and risks were discussed with the patient.                            All questions were answered, and informed consent                            was obtained. Prior Anticoagulants: The patient has                            taken no previous anticoagulant or antiplatelet                            agents. ASA Grade Assessment: III - A patient with                            severe systemic disease. After reviewing the risks  and benefits, the patient was deemed in                            satisfactory condition to undergo the procedure.                           After obtaining informed consent, the colonoscope                            was passed under direct vision. Throughout the                            procedure, the patient's blood pressure, pulse, and                           oxygen saturations were monitored continuously. The                            CF-HQ190L ([id number] Olympus colonoscope was                            introduced through the anus and advanced to the the                            cecum, identified by appendiceal orifice and                            ileocecal valve. The colonoscopy was performed                            without difficulty. The patient tolerated the                            procedure. The quality of the bowel preparation was                            inadequate. The ileocecal valve, appendiceal                            orifice, and rectum were photographed. Scope In: 7:49:07 AM Scope Out: 8:17:33 AM Scope Withdrawal Time: 0 hours 22 minutes 14 seconds  Total Procedure Duration: 0 hours 28 minutes 26 seconds  Findings:      The digital rectal exam findings include hemorrhoids. Pertinent       negatives include no palpable rectal lesions.      Copious quantities of semi-liquid semi-solid stool was found in the       entire colon, interfering with visualization. Lavage of the area was       performed using copious amounts, resulting in incomplete clearance with       continued poor visualization.      An approximately 45 mm polyp was found in the ascending colon/cecum. The       polyp was mixed lateral spreading. Polypectomy was not attempted due to       inadequate bowel preparation even after 25 minutes of clean out to  attempt resection. He continued to have stool move into cecum from the       ileum during the procedure.      Non-bleeding non-thrombosed external and internal hemorrhoids were found       during perianal exam, during digital exam and during endoscopy. The       hemorrhoids were Grade II (internal hemorrhoids that prolapse but reduce       spontaneously). Impression:               - Preparation of the colon was inadequate.                           - Hemorrhoids found on  digital rectal exam.                           - Stool in the entire examined colon.                           - One 45 mm mixed lateral spreading polyp in the                            ascending colon/cecum. Not removed today due to                            incomplete/inadequate visualization.                           - Non-bleeding non-thrombosed external and internal                            hemorrhoids. Recommendation:           - The patient will be observed post-procedure,                            until all discharge criteria are met.                           - Discharge patient to home.                           - Patient has a contact number available for                            emergencies. The signs and symptoms of potential                            delayed complications were discussed with the                            patient. Return to normal activities tomorrow.                            Written discharge instructions were provided to the                            patient.                           -  Resume previous diet.                           - Continue present medications.                           - Repeat colonoscopy within 4-6 weeks. Should                            patient decide that he wants a surgical opinion, we                            would be happy to provide this, but at this point                            he still feels he is OK to attempt colonoscopy.                           - Patient will need to be on Miralax 1-2 times                            daily for 1 week prior to the procedure. He should                            be on a low-residue diet for 1-week prior to the                            procedure. He will need a 2-day Colonoscopy                            preparation. On the 1st day of preparation, patient                            may be on full liquid diet. He should do a Miralax                             preparation. For the 2nd day of preparation,                            patient will be on clear liquid diet. He should do                            a Suprep preparation. He will be able to take the                            preparation cold. He may do it as able to tolerate.                            I will be prescribing Zofran ODT PRN should he need  this.                           - The findings and recommendations were discussed                            with the patient.                           - The findings and recommendations were discussed                            with the patient's family. Procedure Code(s):        --- Professional ---                           [phone], Colonoscopy, flexible; diagnostic, including                            collection of specimen(s) by brushing or washing,                            when performed (separate procedure) Diagnosis Code(s):        --- Professional ---                           K64.1, Second degree hemorrhoids                           K63.5, Polyp of colon CPT copyright 2019 American Medical Association. All rights reserved. The codes documented in this report are preliminary and upon coder review may  be revised to meet current compliance requirements. Justice Britain, MD 07/02/2019 8:45:49 AM Number of Addenda: 0

## 2019-07-02 NOTE — Transfer of Care (Signed)
Immediate Anesthesia Transfer of Care Note  Patient: [name]  Procedure(s) Performed: COLONOSCOPY WITH PROPOFOL (N/A )  Patient Location: PACU  Anesthesia Type:MAC  Level of Consciousness: awake, alert  and oriented  Airway & Oxygen Therapy: Patient Spontanous Breathing  Post-op Assessment: Report given to RN, Post -op Vital signs reviewed and stable and Patient moving all extremities  Post vital signs: Reviewed and stable  Last Vitals:  Vitals Value Taken Time  BP 136/86 07/02/19 0829  Temp 36.1 C 07/02/19 0829  Pulse 90 07/02/19 0829  Resp 20 07/02/19 0829  SpO2 97 % 07/02/19 0829    Last Pain:  Vitals:   07/02/19 0829  TempSrc:   PainSc: 0-No pain         Complications: No apparent anesthesia complications

## 2019-07-03 NOTE — Telephone Encounter (Signed)
Message to call pt in 4 weeks to set up procedures.  Ok to use Su tab per Dr Rush Landmark

## 2019-07-06 ENCOUNTER — Ambulatory Visit (INDEPENDENT_AMBULATORY_CARE_PROVIDER_SITE_OTHER): Payer: BC Managed Care – PPO | Admitting: Gastroenterology

## 2019-07-06 DIAGNOSIS — Z23 Encounter for immunization: Secondary | ICD-10-CM | POA: Diagnosis not present

## 2019-07-25 ENCOUNTER — Encounter: Payer: Self-pay | Admitting: Gastroenterology

## 2019-07-25 ENCOUNTER — Ambulatory Visit (INDEPENDENT_AMBULATORY_CARE_PROVIDER_SITE_OTHER): Payer: BC Managed Care – PPO | Admitting: Gastroenterology

## 2019-07-25 ENCOUNTER — Other Ambulatory Visit (INDEPENDENT_AMBULATORY_CARE_PROVIDER_SITE_OTHER): Payer: BC Managed Care – PPO

## 2019-07-25 ENCOUNTER — Telehealth: Payer: Self-pay

## 2019-07-25 VITALS — BP 134/68 | HR 80 | Temp 97.6°F | Ht 70.0 in | Wt 234.4 lb

## 2019-07-25 DIAGNOSIS — R6881 Early satiety: Secondary | ICD-10-CM | POA: Diagnosis not present

## 2019-07-25 DIAGNOSIS — K5909 Other constipation: Secondary | ICD-10-CM

## 2019-07-25 DIAGNOSIS — R110 Nausea: Secondary | ICD-10-CM

## 2019-07-25 DIAGNOSIS — K7460 Unspecified cirrhosis of liver: Secondary | ICD-10-CM | POA: Diagnosis not present

## 2019-07-25 DIAGNOSIS — B3781 Candidal esophagitis: Secondary | ICD-10-CM | POA: Diagnosis not present

## 2019-07-25 DIAGNOSIS — D649 Anemia, unspecified: Secondary | ICD-10-CM

## 2019-07-25 DIAGNOSIS — Z86010 Personal history of colonic polyps: Secondary | ICD-10-CM

## 2019-07-25 LAB — TESTOSTERONE: Testosterone: 477.03 ng/dL (ref 300.00–890.00)

## 2019-07-25 MED ORDER — METOCLOPRAMIDE HCL 5 MG PO TABS: 5.0000 mg | ORAL_TABLET | Freq: Three times a day (TID) | ORAL | 0 refills | Status: DC

## 2019-07-25 MED ORDER — FLUCONAZOLE 200 MG PO TABS: 200.0000 mg | ORAL_TABLET | Freq: Every day | ORAL | 0 refills | Status: AC

## 2019-07-25 MED ORDER — ONDANSETRON 4 MG PO TBDP: 4.0000 mg | ORAL_TABLET | Freq: Four times a day (QID) | ORAL | 1 refills | Status: DC | PRN

## 2019-07-25 MED ORDER — POLYETHYLENE GLYCOL 3350 17 G PO PACK: 17.0000 g | PACK | Freq: Two times a day (BID) | ORAL | 0 refills | Status: DC

## 2019-07-25 NOTE — Telephone Encounter (Signed)
Patient seen in the office today.  Dr. Havery Moros would like him scheduled for his colonoscopy at the hospital with Dr. Rush Landmark as soon as possible.  Dr. Donneta Romberg note indicated pt should be re-scoped by mid April.  Thank you.

## 2019-07-25 NOTE — Progress Notes (Signed)
HPI :  54 year old male here for follow-up for cirrhosis, constipation, nausea, colon polyps.  Please see his intake visit for full details of his case.  As previously noted he had changes concerning for cirrhosis of the liver dating back on imaging at least since 2014.  He is also had chronic thrombocytopenia.  He was not aware he had cirrhosis until our last clinic visit.  I did an extensive serologic evaluation which did not reveal any clear causes.  We performed an ultrasound which showed changes of cirrhosis without any mass lesions or concerning features.  He has been compensated historically.  He does not drink any alcohol.  He was vaccinated to hepatitis B since his last visit.  He underwent an EGD with me for varices screening as well as for symptoms of nausea and was found to have severe esophageal candidiasis for which she was treated with fluconazole.  No obvious varices noted but he did have esophagitis which made this a bit more challenging to visualize.  He was treated with fluconazole for his Candida esophagitis and states his nausea and digestive symptoms were considerably better.  He is concerned has had recurrence of candidiasis since have seen him.  He is having some intermittent dysphagia and odynophagia at times which were the symptoms he had previously.  States his nausea was better after treatment and that has since recurred as well.  He continues to have difficulty eating, Zofran helps significantly.  He does have sense of early satiety and poor appetite once he starts eating, however while he has nausea he does not vomit.  He does have longstanding diabetes, sometimes has difficulty with his DM medications if he is not eating well.  He has some ongoing chronic constipation.  We performed a colonoscopy since his last visit he was noted to have a poor prep but what was visualized was a very large right-sided colon polyp in the ascending colon invading into the cecum, laterally  spreading.  He was referred to Dr. Rush Landmark for EMR of this high risk lesion.  He underwent an attempt for removal with this with Dr. Rush Landmark and prep was again poor.  I had recommended MiraLAX at her last visit for this issue but he has not been taking it reliably.  He will have a bowel movement once every few days at baseline.  Otherwise patient is noted to have a chronic anemia of unclear etiology, unsure if related to cirrhosis.  He has had a prior serologic work-up that has been normal including iron levels.  He endorses some fatigue and feeling weak, he wonders about if he needs a testosterone replacement.  Prior workup: [place] 04/01/2014 - cirrhosis?  CT scan 06/22/2012 - cirrhosis with splenomegaly     EGD 05/17/19 -  - Localized mucosal changes characterized by nodulari mucosa noted at 38cm from the incisors were found in the lower third of the esophagus. Biopsies were taken with a cold forceps for histology. - Diffuse, white plaques were found in the middle third of the esophagus and in the lower third of the esophagus, grossly consistent with esophageal candidiasis. - The exam of the esophagus was otherwise normal. No obvious esophageal varices notes however the mucosa was hard to visualize due to severe candidiasis. - Patchy erythematous mucosa was found in the gastric body and in the gastric antrum, likely due to portal hypertension. No obvious ulcers noted. - The exam of the stomach was otherwise normal. - Biopsies were taken with a cold forceps  in the gastric body, at the incisura and in the gastric antrum for histology. - The duodenal bulb and second portion of the duodenum were normal.  Colonoscopy 05/17/19 - Preparation of the colon was inadequate for screening purposes. - The examined portion of the ileum was normal. - One large laterally spreading polyp in the right colon as described. Biopsied, hopefully this is just an advanced adenoma. I do think it may be  endoscopically resectable but would need EMR by advanced endoscopy in the hospital setting. - Normal colon otherwise - left colon not well visualized as above due to stool burden.  Diagnosis 1. Surgical [P], gastric antrum and gastric body - GASTRIC ANTRAL AND OXYNTIC MUCOSA SHOWING REACTIVE GASTROPATHY WITH ECTATIC LAMINA PROPRIA CAPILLARIES, CONSISTENT WITH PORTAL HYPERTENSIVE GASTROPATHY - WARTHIN STARRY STAIN IS NEGATIVE FOR HELICOBACTER PYLORI 2. Surgical [P], distal esophagus - MARKED ACUTE ESOPHAGITIS. SEE NOTE 3. Surgical [P], right colon polyp - SUPERFICIAL FRAGMENTS OF TUBULOVILLOUS ADENOMA. SEE NOTE  Colonoscopy 07/02/19 - Dr. Rush Landmark Preparation of the colon was inadequate. - Hemorrhoids found on digital rectal exam. - Stool in the entire examined colon. - One 45 mm mixed lateral spreading polyp in the ascending colon/cecum. Not removed today due to incomplete/inadequate visualization. - Non-bleeding non-thrombosed external and internal hemorrhoids.   [place] 05/16/19 - IMPRESSION: 1. Hepatic cirrhosis without discrete focal mass identified. 2. Gallstones are present measuring up to 0.7 cm in diameter.     Past Medical History:  Diagnosis Date  . Cirrhosis ([place])   . Colon polyps   . Diabetes mellitus without complication ([place])   . Esophageal candidiasis ([place])   . GERD (gastroesophageal reflux disease)    as a child, some as an adult  . Hypertension   . Sleep apnea    does not use Cpap  . Thrombocytopenia ([place])      Past Surgical History:  Procedure Laterality Date  . APPENDECTOMY    . BACK SURGERY    . COLONOSCOPY    . COLONOSCOPY WITH PROPOFOL N/A 07/02/2019   Procedure: COLONOSCOPY WITH PROPOFOL;  Surgeon: Rush Landmark Telford Nab., MD;  Location: [facility];  Service: Gastroenterology;  Laterality: N/A;   Family History  Problem Relation Age of Onset  . Brain cancer Mother   . Diabetes Father   . Colon cancer Neg Hx   . Liver cancer Neg Hx   .  Esophageal cancer Neg Hx   . Inflammatory bowel disease Neg Hx   . Liver disease Neg Hx   . Pancreatic cancer Neg Hx   . Rectal cancer Neg Hx   . Stomach cancer Neg Hx    Social History   Tobacco Use  . Smoking status: Former Smoker    Quit date: 06/23/1995    Years since quitting: 24.1  . Smokeless tobacco: Current User    Types: Chew  Substance Use Topics  . Alcohol use: Yes    Comment: 3 drinks a month  . Drug use: Yes    Types: Marijuana   Current Outpatient Medications  Medication Sig Dispense Refill  . atorvastatin (LIPITOR) 20 MG tablet Take 1 tablet (20 mg total) by mouth at bedtime. 90 tablet 0  . cyclobenzaprine (FLEXERIL) 10 MG tablet Take 1 tablet (10 mg total) by mouth 3 (three) times daily as needed for muscle spasms. 30 tablet 0  . gabapentin (NEURONTIN) 600 MG tablet Take 2 tablets (1,200 mg total) by mouth 3 (three) times daily. Max 3,600 mg per day (Patient taking differently: Take 1,200-1,800  mg by mouth See admin instructions. Take 2 tablets (1200 mg) by mouth twice daily & take 3 tablets (1800 mg) by mouth at bedtime) 540 tablet 0  . losartan (COZAAR) 50 MG tablet Take 50 mg by mouth every evening.    . metFORMIN (GLUCOPHAGE) 500 MG tablet Take 2 tablets (1,000 mg total) by mouth 2 (two) times daily with a meal. (Patient taking differently: Take 500 mg by mouth in the morning and at bedtime. ) 60 tablet 2  . omeprazole (PRILOSEC) 40 MG capsule Take 1 capsule (40 mg total) by mouth daily. (Patient taking differently: Take 40 mg by mouth daily as needed (acid reflux/indigestion.). ) 30 capsule 3  . ondansetron (ZOFRAN ODT) 4 MG disintegrating tablet Take 1 tablet (4 mg total) by mouth every 6 (six) hours as needed for nausea or vomiting. 60 tablet 1  . Vitamin D, Ergocalciferol, (DRISDOL) 1.25 MG (50000 UT) CAPS capsule TAKE 1 CAPSULE BY MOUTH TWO TIMES A WEEK.  [facility] AND SUNDAY (Patient taking differently: Take 50,000 Units by mouth 2 (two) times a week.  Wednesdays & Sundays.) 24 capsule 3  . fluconazole (DIFLUCAN) 200 MG tablet Take 1 tablet (200 mg total) by mouth daily for 14 days. Take 400 mg on day one, then 200mg  daily for 13 days 15 tablet 0  . metoCLOPramide (REGLAN) 5 MG tablet Take 1 tablet (5 mg total) by mouth 3 (three) times daily before meals. 90 tablet 0  . polyethylene glycol (MIRALAX) 17 g packet Take 17 g by mouth 2 (two) times daily. Increase as needed 14 each 0   No current facility-administered medications for this visit.   No Known Allergies   Review of Systems: All systems reviewed and negative except where noted in HPI.   Lab Results  Component Value Date   WBC 7.2 05/11/2019   HGB 12.4 (L) 05/11/2019   HCT 35.5 (L) 05/11/2019   MCV 92.8 05/11/2019   PLT 92.0 (L) 05/11/2019    . Lab Results  Component Value Date   CREATININE 0.94 05/11/2019   BUN 3 (L) 05/11/2019   NA 132 (L) 05/11/2019   K 3.4 (L) 05/11/2019   CL 97 05/11/2019   CO2 30 05/11/2019    Lab Results  Component Value Date   ALT 28 05/11/2019   AST 23 05/11/2019   ALKPHOS 169 (H) 05/11/2019   BILITOT 1.0 05/11/2019   No results found for: AFP   Physical Exam: BP 134/68   Pulse 80   Temp 97.6 F (36.4 C)   Ht 5\' 10"  (1.778 m)   Wt 234 lb 6.4 oz (106.3 kg)   BMI 33.63 kg/m  Constitutional: Pleasant,well-developed, male in no acute distress. Neurological: Alert and oriented to person place and time. Psychiatric: Normal mood and affect. Behavior is normal.   ASSESSMENT AND PLAN: 54 year old male here for reassessment the following:  Cirrhosis - he has had this at least for several years and has been compensated.  Etiology unclear, not sure if related to fatty liver.  I discussed cirrhosis with him, risks of decompensation and [name] moving forward.  Given we are not sure of the etiology and his young age at diagnosis, in his 16s based on imaging, liver biopsy may be reasonable to clarify the cause however may not alter  management.  We discussed risk and benefits and given his other issues he wants to hold off on liver biopsy right now.  He will need another ultrasound in July for [facility] screening  as well as regular labs.  He will continue to see me every 6 months for this issue.  Chronic nausea / early satiety - using Zofran frequently for nausea related to his meals.  He has early satiety in light of his diabetes is at risk for gastroparesis and we discussed this possibility.  We discussed pursuing gastric emptying study to clarify if he has this versus trial of low-dose Reglan.  He wants to hold off on additional testing right now given his recent work-up and give low-dose Reglan a trial.  We discussed risks of tar dive dyskinesia which is extremely rare and usually only at 30 mg a day or higher..  We will start 5 mg 3 times daily and see how he does for the next few weeks.  I will refill Zofran otherwise  Esophageal candidiasis - had complete relief of periodic dysphagia/odynophagia with treatment of this previously.  He had severe esophageal candidiasis and feels like the symptoms have started to recur again more recently.  We will give him another 2-week course of fluconazole to see if that relieves his symptoms.  If not may need to have another EGD  Constipation / colon polyp -he has ongoing constipation and has had a poor bowel prep x2.  He was supposed to be on MiraLAX daily after our last clinic visit has been doing that.  I recommend he use MiraLAX twice a day every day moving forward to keep bowels moving daily, and will reach out to Dr. Donneta Romberg staff to schedule his repeat colonoscopy at the hospital with a EMR of large right-sided lesion (TVA on biopsies).  He will need double prep for this exam and high-dose MiraLAX in days preceding it.  Anemia / weakness - appears to have chronic anemia but endorses fatigue and weakness.  We will check his testosterone level to ensure that is okay.  We will let him know  results, may need to see his primary care about some of this.  Again anemia could be related to cirrhosis, consider hematology evaluation as well  [place] Cellar, MD [facility] Gastroenterology

## 2019-07-25 NOTE — Patient Instructions (Addendum)
If you are age 52 or older, your body mass index should be between 23-30. Your Body mass index is 33.63 kg/m. If this is out of the aforementioned range listed, please consider follow up with your Primary Care Provider.  If you are age 23 or younger, your body mass index should be between 19-25. Your Body mass index is 33.63 kg/m. If this is out of the aformentioned range listed, please consider follow up with your Primary Care Provider.   We have sent the following medications to your pharmacy for you to pick up at your convenience: Reglan 5 mg: Take three times a day before meals  Zofran: Take every 6 hours as needed  Fluconazole 200 mg (Diflucan): Take 2 tablets (400 mg) on day 1 and 1 tablet (200 mg) on days 2 through 14.   Take Miralax as directed at least 3 times a day or more if needed.  Please go to the lab in the basement of our building to have lab work done as you leave today. Hit "B" for basement when you get on the elevator.  When the doors open the lab is on your left.  We will call you with the results. Thank you.  You will be due for an Ultrasound and lab work for Cirrhosis in July.  We will remind you when it is time to schedule.   Thank you for entrusting me with your care and for choosing [facility], Dr. Beaver Cellar

## 2019-07-25 NOTE — Telephone Encounter (Signed)
In error

## 2019-07-26 ENCOUNTER — Ambulatory Visit: Payer: BC Managed Care – PPO | Attending: Family

## 2019-07-26 DIAGNOSIS — Z23 Encounter for immunization: Secondary | ICD-10-CM

## 2019-07-26 NOTE — Progress Notes (Signed)
   Covid-19 Vaccination Clinic  Name:  [name]    MRN: [id number] DOB: November 08, 1965  07/26/2019  [name] was observed post Covid-19 immunization for 15 minutes without incident. He was provided with Vaccine Information Sheet and instruction to access the V-Safe system.   [name] was instructed to call 911 with any severe reactions post vaccine: Marland Kitchen Difficulty breathing  . Swelling of face and throat  . A fast heartbeat  . A bad rash all over body  . Dizziness and weakness   Immunizations Administered    Name Date Dose VIS Date Route   Moderna COVID-19 Vaccine 07/26/2019 11:41 AM 0.5 mL 04/03/2019 Intramuscular   Manufacturer: Moderna   Lot: [id number]   [id number]

## 2019-07-30 NOTE — Telephone Encounter (Signed)
Patient will need to be on Miralax 2 times daily for 1 week prior to the procedure.  He should be on a low-residue diet for 1-week prior to the procedure. He will need a  2-day Colonoscopy preparation. On the 1st day of preparation, patient may be on full  liquid diet. He should do a Miralax preparation. For the 2nd day of preparation,  patient will be on clear liquid diet. He should do a Suprep preparation. He will be able  to take the preparation cold. He may do it as able to tolerate. I will be prescribing  Zofran ODT PRN should he need this

## 2019-07-30 NOTE — Telephone Encounter (Signed)
Left message on machine to call back  

## 2019-07-30 NOTE — Telephone Encounter (Signed)
Armbruster, Carlota Raspberry, MD  Timothy Lasso, RN  Hi [name],   I think [name] may have been in touch with you about scheduling this patient for colonoscopy with EMR with Dr. Rush Landmark. Can you help do that for [name]. Also the patient needs a double prep for this exam, given results of poor prep previously with recommendation as outlined:   Patient will need to be on Miralax 2 times daily for 1 week prior to the procedure.  He should be on a low-residue diet for 1-week prior to the procedure. He will need a  2-day Colonoscopy preparation. On the 1st day of preparation, patient may be on full  liquid diet. He should do a Miralax preparation. For the 2nd day of preparation,  patient will be on clear liquid diet. He should do a Suprep preparation. He will be able  to take the preparation cold. He may do it as able to tolerate. I will be prescribing  Zofran ODT PRN should he need this.   Thank you!  [name]

## 2019-07-30 NOTE — Telephone Encounter (Signed)
Reneisha Stilley, please work on scheduling a 2-hour slot for patient.  2-day preparation and Miralax week before as outlined on the note. Let SA and I know when patient decides to have the procedure.

## 2019-08-01 ENCOUNTER — Other Ambulatory Visit: Payer: Self-pay

## 2019-08-01 DIAGNOSIS — K635 Polyp of colon: Secondary | ICD-10-CM

## 2019-08-01 NOTE — Telephone Encounter (Signed)
The pt has been scheduled for colon EMR with Dr Jerilynn Mages at [facility] 5/3 at 12 noon COVID appt on 4/29 at 940 am Previsit scheduled for 4/26 at 1 pm.

## 2019-08-01 NOTE — Telephone Encounter (Signed)
The pt has been advised of the colon emr appt as well as the previsit.  The pt has been advised of the information and verbalized understanding.

## 2019-08-08 ENCOUNTER — Other Ambulatory Visit: Payer: Self-pay | Admitting: Family Medicine

## 2019-08-08 DIAGNOSIS — E1140 Type 2 diabetes mellitus with diabetic neuropathy, unspecified: Secondary | ICD-10-CM

## 2019-08-10 ENCOUNTER — Other Ambulatory Visit: Payer: Self-pay | Admitting: Family Medicine

## 2019-08-10 NOTE — Telephone Encounter (Signed)
Per patient gabepentin script sent on 08/08/19 is an Old Rx --- His Rx had been :   02/26/2019 note Diabetic Neuropathy, Painful - Discussed increasing pt's prescription Gabapentin. - Per patient, tolerates Gabapentin well without side-effects. - Increased dose of Gabapentin to 600, 600, and 1200 MGs daily   Meds ordered this encounter  Medications   DISCONTD: gabapentin (NEURONTIN) 600 MG tablet    Sig: 600mg  q am, 600mg  q afternoon and 1200mg  q hs    Dispense:  360 tablet    Refill:  0   Vitamin D, Ergocalciferol, (DRISDOL) 1.25 MG (50000 UT) CAPS capsule    Sig: TAKE 1 CAPSULE BY MOUTH TWO TIMES A WEEK.  [facility] AND SUNDAY    Dispense:  24 capsule    Refill:  3     05/29/19 DOS per pt says increased by provider to :         gabapentin (NEURONTIN) 600 MG tablet Take 2 tablets (1,200 mg total) by mouth 3 (three) times daily. Max 3,600 mg per day     ----Patient request corrected Rx to pharmacy  [place], [place] [address], [place] [address]  Phone:  [phone] Fax:  [phone]  --glh

## 2019-08-13 ENCOUNTER — Other Ambulatory Visit: Payer: Self-pay | Admitting: Family Medicine

## 2019-08-13 DIAGNOSIS — E1140 Type 2 diabetes mellitus with diabetic neuropathy, unspecified: Secondary | ICD-10-CM

## 2019-08-13 MED ORDER — GABAPENTIN 600 MG PO TABS: ORAL_TABLET | ORAL | 0 refills | Status: DC

## 2019-08-13 NOTE — Telephone Encounter (Signed)
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 - Stable at this time, well-managed on increased dose of gabapentin. - Continue management as established.   - Will continue to monitor and adjust treatment plan in future PRN.

## 2019-08-13 NOTE — Telephone Encounter (Signed)
Patient was seen 04/05/19-per Note: Diabetic Neuropathy - Per patient, increased dose of gabapentin providing increased relief. - Will continue to monitor.    No change in dosage to medication at that time.

## 2019-08-13 NOTE — Telephone Encounter (Signed)
My apologies, I misinterpreted prior note re: Dr T and thought pt was seeing him for one of his chronic pain/ ortho issues since Dr T is sports med.     On 02/26/19, I increased his gabapentin dose to below:   ( see plan at that OV below)   Diabetic Neuropathy, Painful - Discussed increasing pt's prescription Gabapentin. - Per patient, tolerates Gabapentin well without side-effects. - Increased dose of Gabapentin to 600, 600, and 1200 MGsdaily.   -thus I increased his dose to 600 in am,  600 in afternoon and 1200 q hs.   ( I have no idea where the other dose/script sent in by Dr T came from of that much higher dose.     -> Call pt and notify him I will send in 90 day RF of the gabapentin dose he has been continually been getting from me/ he has been taking since Oct 2020 that he has repeatedly reported has worked well for him.

## 2019-08-13 NOTE — Telephone Encounter (Signed)
This is refill given by another provider: Dr. Aundria Mems.    gabapentin (NEURONTIN) 600 MG tablet [id number]  DISCONTINUED   Order Details Dose: 1,200 mg Route: Oral Frequency: 3 times daily  Dispense Quantity: 540 tablet Refills: 0        Sig: Take 2 tablets (1,200 mg total) by mouth 3 (three) times daily. Max 3,600 mg per day  Patient taking differently: Take 1,200-1,800 mg by mouth See admin instructions. Take 2 tablets (1200 mg) by mouth twice daily & take 3 tablets (1800 mg) by mouth at bedtime       Start Date: 04/26/19 End Date: 08/08/19  Discontinued by: Mickel Crow, CMA on 08/08/2019 17:23       Written Date: 04/26/19 Expiration Date: 04/25/20     Diagnosis Association: Diabetic neuropathy, painful ([place]) (E11.40)  Original Order:  gabapentin (NEURONTIN) 600 MG tablet [id number]  Providers  Authorizing Provider: Silverio Decamp, MD NPI: [id number]   DEA #: [id number]  Ordering User:  Silverio Decamp, [place], [place] - [place]  [id number] [place], [place] [address]  Phone:  [phone]  Fax:  [phone]  DEA #:  --

## 2019-08-13 NOTE — Telephone Encounter (Signed)
Called patient to notify of the below and patient states Dr. Dianah Field is someone that works call for our office and he called med in on a weekend for emergency. Patient states you the one who increase the dose of gabapentin and would like refill of the med. He states "I dont even know who that Dr. Dianah Field is and I have never seen him. He works with you".   Please review chart and advise. AS, CMA

## 2019-08-13 NOTE — Telephone Encounter (Signed)
Please confirm with pt he is taking it as written/ documented in chart and not differently.   Pt possibly increased dose on his own b/c I went through each OV note since Oct 2020 he had with [place] and also all the AVS's to make sure I didn't write something different at top of one of those.  It is documented in OV note 600mg , 600mg  and 1200mg  at night.  And pt has told us at each subsequent visit how well controlled his pain was on that regimen.     (( I see Dr Mcneil Sober script was very different end Dec than that what we have been giving him. (( 1200 mg TID ))   - Please confirm with pt and let me know as I RFed and sent in his gabapentin at dose documented. ( 600mg , 600mg  and 1200mg  at night. For 90 d supply)    - We will have to call and cancel this script if he decided to change his dose on his own to something different.

## 2019-08-13 NOTE — Telephone Encounter (Signed)
Yes, if he is taking 1200 mg tablets and taking 2 tabs, 2 times daily, and additionally 3 tablets at night, I am not comfortable refilling this prescription at that higher dose.    He will have to get that from Dr. Dianah Field in the future.  Thank you for making patient aware.

## 2019-08-14 MED ORDER — GABAPENTIN 600 MG PO TABS: ORAL_TABLET | ORAL | 0 refills | Status: DC

## 2019-08-14 NOTE — Telephone Encounter (Signed)
Patient is aware of the below med directions and verbalized understanding. Med sent to pharmacy. AS, CMA

## 2019-08-14 NOTE — Telephone Encounter (Signed)
I called patient and asked him what he was currently taking. Patient states he takes 1200mg  in am 1200mg  in pm and 1800mg  at bedtime.   I have called [facility] and canceled script you sent in yesterday. AS, CMA

## 2019-08-14 NOTE — Telephone Encounter (Signed)
Thank you very much for checking with him.  Not sure who told him to go to that dose or anything however, I am okay with giving him 600 mg tablets of gabapentin.  Sig: 2 tabs every morning, 2 tabs q. afternoon and 3 tabs nightly. Please only dispense a 90-day supply with no refills.  Thanks for canceling all prior prescriptions of it and also for sending that in, or pending it if you cannot.

## 2019-08-23 ENCOUNTER — Encounter: Payer: Self-pay | Admitting: Family Medicine

## 2019-08-23 ENCOUNTER — Telehealth (INDEPENDENT_AMBULATORY_CARE_PROVIDER_SITE_OTHER): Payer: BC Managed Care – PPO | Admitting: Family Medicine

## 2019-08-23 ENCOUNTER — Other Ambulatory Visit: Payer: Self-pay

## 2019-08-23 VITALS — Ht 71.0 in | Wt 240.0 lb

## 2019-08-23 DIAGNOSIS — Z91199 Patient's noncompliance with other medical treatment and regimen due to unspecified reason: Secondary | ICD-10-CM

## 2019-08-23 DIAGNOSIS — E1159 Type 2 diabetes mellitus with other circulatory complications: Secondary | ICD-10-CM | POA: Diagnosis not present

## 2019-08-23 DIAGNOSIS — E1169 Type 2 diabetes mellitus with other specified complication: Secondary | ICD-10-CM | POA: Diagnosis not present

## 2019-08-23 DIAGNOSIS — E782 Mixed hyperlipidemia: Secondary | ICD-10-CM

## 2019-08-23 DIAGNOSIS — I152 Hypertension secondary to endocrine disorders: Secondary | ICD-10-CM

## 2019-08-23 DIAGNOSIS — I10 Essential (primary) hypertension: Secondary | ICD-10-CM

## 2019-08-23 DIAGNOSIS — Z9119 Patient's noncompliance with other medical treatment and regimen: Secondary | ICD-10-CM | POA: Diagnosis not present

## 2019-08-23 MED ORDER — LOSARTAN POTASSIUM 50 MG PO TABS: 50.0000 mg | ORAL_TABLET | Freq: Every day | ORAL | 0 refills | Status: DC

## 2019-08-23 MED ORDER — ATORVASTATIN CALCIUM 20 MG PO TABS: 20.0000 mg | ORAL_TABLET | Freq: Every day | ORAL | 0 refills | Status: DC

## 2019-08-23 NOTE — Progress Notes (Signed)
Telehealth office visit note for [name], D.O- at Primary Care at [facility]   I connected with current patient today and verified that I am speaking with the correct person   . Location of the patient: Home . Location of the provider: Office - This visit type was conducted due to national recommendations for restrictions regarding the COVID-19 Pandemic (e.g. social distancing) in an effort to limit this patient's exposure and mitigate transmission in our community.    - No physical exam could be performed with this format, beyond that communicated to [place] by the patient/ family members as noted.   - Additionally my office staff/ schedulers were to discuss with the patient that there may be a monetary charge related to this service, depending on their medical insurance.  My understanding is that patient understood and consented to proceed.     _________________________________________________________________________________   History of Present Illness:  I, [name], am serving as Education administrator for [employer].  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  He just started back to work this week.  Patient reports that he has difficulty scheduling appointments due to his work schedule.  HPI:   Diabetes Mellitus:  Home glucose readings:     - Patient reports good compliance with therapy plan: medication and/or lifestyle modification  - His denies acute concerns or problems related to treatment plan  - He denies new concerns.  Denies polyuria/polydipsia, hypo/ hyperglycemia symptoms.  Denies new onset of: chest pain, exercise intolerance, shortness of breath, dizziness, visual changes, headache, lower extremity swelling or claudication.   Last A1C in the office was:  Lab Results  Component Value Date   HGBA1C 8.1 (A) 05/29/2019   HGBA1C 6.7 (A) 02/26/2019   HGBA1C 6.9 (A) 11/14/2018   Lab Results  Component Value Date   MICROALBUR 10 11/14/2018   LDLCALC 47 11/30/2018   CREATININE 0.94 05/11/2019   BP Readings from Last 3 Encounters:  07/25/19 134/68  07/02/19 (!) 145/79  05/30/19 (!) 146/70   Wt Readings from Last 3 Encounters:  08/23/19 240 lb (108.9 kg)  07/25/19 234 lb 6.4 oz (106.3 kg)  07/02/19 240 lb (108.9 kg)   HPI:  Hypertension:  Feels "I've been doing fine."  He's run out of some medications.  He does not check his blood pressure at home.  Notes he needs his blood pressure cuff, and his wife typically does this for him.  Says "I've got to get organized, doc."  Thinks that his blood pressure is typically something like 127/94.  - Patient reports good compliance with medication and/or lifestyle modification  - His denies acute concerns or problems related to treatment plan  - He denies new onset of: chest pain, exercise intolerance, shortness of breath, dizziness, visual changes, headache, lower extremity swelling or claudication.   Last 3 blood pressure readings in our office are as follows: BP Readings from Last 3 Encounters:  07/25/19 134/68  07/02/19 (!) 145/79  05/30/19 (!) 146/70   Filed Weights   08/23/19 1125  Weight: 240 lb (108.9 kg)    Depression screen [facility] 2/9 08/23/2019 05/29/2019 04/05/2019 02/26/2019 01/15/2019  Decreased Interest 0 0 0 3 0  Down, Depressed, Hopeless 0 0 0 3 0  PHQ - 2 Score 0 0 0 6 0  Altered sleeping  0 0 0 3 0  Tired, decreased energy 0 0 1 3 0  Change in appetite 0 0 1 3 0  Feeling bad or failure about yourself  0 0 0 3 0  Trouble concentrating 0 0 0 3 0  Moving slowly or fidgety/restless 0 0 0 3 0  Suicidal thoughts 0 0 0 3 0  PHQ-9 Score 0 0 2 27 0  Difficult doing work/chores - - Not difficult at all - Not difficult at all  Some recent data might be hidden      Impression and Recommendations:     1. POORLY CONTROLLED Type 2 diabetes  mellitus with other specified complication, without long-term current use of insulin ([place])   2. POORLY CONTROLLED Hypertension associated with diabetes ([place])   3. Mixed diabetic hyperlipidemia associated with type 2 diabetes mellitus ([place])   4. pt with noncompliance with medical treatment, presenting hazards to health      - Last seen for chronic follow-up on 05/29/2019. - Patient was told to follow up in 3 months with goal A1c <7.0.  - Last lab work obtained eight months ago. - Re-check sodium & potassium next lab work.  See orders.  - Patient remains noncompliant with treatment plan and recommendations.   POORLY CONTROLLED Type 2 DM - A1c was 8.1 last check two months ago, elevated above goal. - Last OV, patient was told to resume metformin as prescribed, BID.  - Per patient unable to come in to re-check his A1c today as advised.  - As patient indicates he is busy and experiencing difficulty scheduling time to check his A1c in the clinic, encouraged patient to obtain an A1c kit for use at home.  - Advised patient to utilize this kit over the weekend and call clinic on Monday with A1c results.  - Continue management as established.  See med list. - 30 day efill of metformin provided today until further evaluation is obtained.  - Will continue to monitor.   POORLY CONTROLLED Hypertension associated with DM Seen for chronic OV on 05/29/2019, and BP at that time was 144/84 at best.  - Advised patient to obtain blood pressure re-check at clinic ASAP.  - Per patient, not checking his blood pressures at home.  - Patient will continue current treatment regimen.  See med list. - Discussed CRITICAL IMPORTANCE of obtaining regular BP measurements to monitor treatment plan.  - Ambulatory blood pressure monitoring STRONGLY encouraged. - Told patient to keep a log of BP and pulse and call clinic with results by Monday, in four days.  - We will continue to monitor.   Mixed diabetic  hyperlipidemia associated with type 2 diabetes mellitus - Last FLP obtained eight months ago:  Triglycerides = 49, down from 156 prior. HDL = 29, down from 33 prior. LDL = 47, down from 94 prior.  - Cholesterol levels stable, with LDL at goal last check.  - Pt will continue current treatment regimen.  See med list.  - We will continue to monitor and re-check as discussed.   Recommendations - Patient will call clinic on Monday with results of A1c and recent log of blood pressures / pulses.    - The patient was advised to call back or seek an in-person evaluation if the symptoms worsen or if the condition fails to improve as anticipated.   Return for call clinic on Monday with results of A1c and recent log of blood pressures / pulses.    Meds ordered this  encounter  Medications  . atorvastatin (LIPITOR) 20 MG tablet    Sig: Take 1 tablet (20 mg total) by mouth at bedtime.    Dispense:  90 tablet    Refill:  0  . losartan (COZAAR) 50 MG tablet    Sig: Take 1 tablet (50 mg total) by mouth daily.    Dispense:  30 tablet    Refill:  0    Medications Discontinued During This Encounter  Medication Reason  . atorvastatin (LIPITOR) 20 MG tablet Reorder  . losartan (COZAAR) 50 MG tablet Reorder      Time spent on visit including pre-visit chart review and post-visit care was 14 minutes.   Note:  This note was prepared with assistance of Dragon voice recognition software. Occasional wrong-word or sound-a-like substitutions may have occurred due to the inherent limitations of voice recognition software.  The [place] was signed into law in 2016 which includes the topic of electronic health records.  This provides immediate access to information in MyChart.  This includes consultation notes, operative notes, office notes, lab results and pathology reports.  If you have any questions about what you read please let us know at your next visit or call us at the office.  We  are right here with you.  This document serves as a record of services personally performed by Jeffrey Dance, DO. It was created on her behalf by [name], a trained medical scribe. The creation of this record is based on the scribe's personal observations and the provider's statements to them.    The above documentation from [name], medical scribe, has been reviewed by Marjory Sneddon, D.O.     __________________________________________________________________________________     Patient Care Team    Relationship Specialty Notifications Start End  Jeffrey Dance, DO PCP - General Family Medicine  07/28/17   Minette Brine, [place]  General Practice  07/29/17      -Vitals obtained; medications/ allergies reconciled;  personal medical, social, Sx etc.histories were updated by CMA, reviewed by me and are reflected in chart   Patient Active Problem List   Diagnosis Date Noted  . Mixed diabetic hyperlipidemia associated with type 2 diabetes mellitus ([place]) 11/16/2017  . Hypertension associated with diabetes ([place]) 07/28/2017  . Diabetes mellitus ([place]) 07/28/2017  . Diabetic neuropathy, painful ([place]) 06/22/2012  . Diabetic peripheral neuropathy associated with type 2 diabetes mellitus ([place]) 11/16/2017  . Morbidly obese ([place]) 07/28/2017  . Vitamin D deficiency 07/28/2017  . H/O noncompliance with medical treatment, presenting hazards to health 07/28/2017  . Cecal polyp 06/02/2019  . 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 Abnormal colonoscopy 06/02/2019  . Swelling of both lower extremities 05/29/2019  . Thrombocytopenia ([place]) 04/03/2019  . Peripheral arterial disease ([place]) 02/15/2019  . Hyperlipidemia 02/15/2019  . Persistent proteinuria 11/14/2018  . Low TSH level 11/14/2018  . High risk medications (not anticoagulants) long-term use 11/14/2018  . Noncompliance with diet and medication regimen 11/14/2018  . Hiatal hernia 11/14/2018  . Anorexia  11/14/2018  . Diabetic ulcer of toe of right foot associated with diabetes mellitus due to underlying condition, with fat layer exposed ([place]) 07/11/2018  . Healthcare maintenance 07/11/2018  . Essential hypertension, benign 06/22/2012  . Pancreatitis, acute 06/22/2012  . Nausea with vomiting 06/22/2012  . Abdominal pain, epigastric 06/22/2012  . Acute hyperkalemia 06/22/2012  . Leukocytosis, unspecified 06/22/2012     Current Meds  Medication Sig  . atorvastatin (LIPITOR) 20 MG tablet Take 1 tablet (  20 mg total) by mouth at bedtime.  . gabapentin (NEURONTIN) 600 MG tablet 2 tablets every morning, 2 tablets every afternoon, 3 tablets nightly. (Patient taking differently: Take 1,200-1,800 mg by mouth See admin instructions. 2 tablets every morning, 2 tablets every afternoon, 3 tablets nightly.)  . losartan (COZAAR) 50 MG tablet Take 1 tablet (50 mg total) by mouth daily.  . metFORMIN (GLUCOPHAGE) 500 MG tablet Take 2 tablets (1,000 mg total) by mouth 2 (two) times daily with a meal. (Patient taking differently: Take 500 mg by mouth in the morning and at bedtime. )  . metoCLOPramide (REGLAN) 5 MG tablet Take 1 tablet (5 mg total) by mouth 3 (three) times daily before meals.  Marland Kitchen omeprazole (PRILOSEC) 40 MG capsule Take 1 capsule (40 mg total) by mouth daily. (Patient taking differently: Take 40 mg by mouth daily as needed (acid reflux/indigestion.). )  . ondansetron (ZOFRAN ODT) 4 MG disintegrating tablet Take 1 tablet (4 mg total) by mouth every 6 (six) hours as needed for nausea or vomiting.  . polyethylene glycol (MIRALAX) 17 g packet Take 17 g by mouth 2 (two) times daily. Increase as needed (Patient taking differently: Take 17 g by mouth daily as needed for moderate constipation. Increase as needed)  . Vitamin D, Ergocalciferol, (DRISDOL) 1.25 MG (50000 UT) CAPS capsule TAKE 1 CAPSULE BY MOUTH TWO TIMES A WEEK.  [facility] AND SUNDAY (Patient taking differently: Take 50,000 Units by mouth 2  (two) times a week. Wednesdays & Sundays.)  . [DISCONTINUED] atorvastatin (LIPITOR) 20 MG tablet Take 1 tablet (20 mg total) by mouth at bedtime.  . [DISCONTINUED] losartan (COZAAR) 50 MG tablet Take 50 mg by mouth every evening.     Allergies:  No Known Allergies   ROS:  See above HPI for pertinent positives and negatives   Objective:   Height '5\' 11"'  (1.803 m), weight 240 lb (108.9 kg).  (if some vitals are omitted, this means that patient was UNABLE to obtain them even though they were asked to get them prior to OV today.  They were asked to call us at their earliest convenience with these once obtained. ) General: A & O * 3; sounds in no acute distress; in usual state of health.  Skin: Pt confirms warm and dry extremities and pink fingertips HEENT: Pt confirms lips non-cyanotic Chest: Patient confirms normal chest excursion and movement Respiratory: speaking in full sentences, no conversational dyspnea; patient confirms no use of accessory muscles Psych: insight appears good, mood- appears full

## 2019-08-27 ENCOUNTER — Telehealth: Payer: Self-pay | Admitting: *Deleted

## 2019-08-27 NOTE — Telephone Encounter (Signed)
Hi Patty,  This patient was a no show for his PV on 08/27/19.  I call and LMOM at 1455.  No return call.  I was able to cancel his PV and Covid test appt but I was not able to cancel his colonoscopy scheduled at [facility]  09/03/19@ 1145 am.  Can you take a look to see if you can remove this procedure from [facility]?  Thank you in advance for your help!  Ileene Hutchinson, RN PV

## 2019-08-28 ENCOUNTER — Ambulatory Visit: Payer: BC Managed Care – PPO | Attending: Family

## 2019-08-28 ENCOUNTER — Ambulatory Visit: Payer: Self-pay

## 2019-08-28 DIAGNOSIS — Z23 Encounter for immunization: Secondary | ICD-10-CM

## 2019-08-28 NOTE — Telephone Encounter (Signed)
The pt has been called and rescheduled to 5/17, previsit and COVID test rescheduled and pt aware

## 2019-08-28 NOTE — Progress Notes (Signed)
   Covid-19 Vaccination Clinic  Name:  [name]    MRN: [id number] DOB: 1966/04/10  08/28/2019  [name] was observed post Covid-19 immunization for 15 minutes without incident. He was provided with Vaccine Information Sheet and instruction to access the V-Safe system.   [name] was instructed to call 911 with any severe reactions post vaccine: Marland Kitchen Difficulty breathing  . Swelling of face and throat  . A fast heartbeat  . A bad rash all over body  . Dizziness and weakness   Immunizations Administered    Name Date Dose VIS Date Route   Moderna COVID-19 Vaccine 08/28/2019  3:16 PM 0.5 mL 04/2019 Intramuscular   Manufacturer: Moderna   Lot: [id number]   [id number]

## 2019-08-30 ENCOUNTER — Other Ambulatory Visit (HOSPITAL_COMMUNITY): Payer: BC Managed Care – PPO

## 2019-09-11 ENCOUNTER — Other Ambulatory Visit: Payer: Self-pay

## 2019-09-11 ENCOUNTER — Ambulatory Visit (AMBULATORY_SURGERY_CENTER): Payer: BC Managed Care – PPO | Admitting: *Deleted

## 2019-09-11 VITALS — Temp 97.3°F | Ht 70.0 in | Wt 236.0 lb

## 2019-09-11 DIAGNOSIS — Z86010 Personal history of colonic polyps: Secondary | ICD-10-CM

## 2019-09-11 DIAGNOSIS — Z01818 Encounter for other preprocedural examination: Secondary | ICD-10-CM

## 2019-09-11 MED ORDER — NA SULFATE-K SULFATE-MG SULF 17.5-3.13-1.6 GM/177ML PO SOLN: 1.0000 | Freq: Once | ORAL | 0 refills | Status: AC

## 2019-09-11 NOTE — Progress Notes (Signed)
Procedure at [facility] 09/17/19 No egg or soy allergy known to patient  No issues with past sedation with any surgeries  or procedures, no intubation problems  No diet pills per patient No home 02 use per patient  No blood thinners per patient  Pt denies issues with constipation  No A fib or A flutter  EMMI video sent to pt's e mail   Due to the COVID-19 pandemic we are asking patients to follow these guidelines. Please only bring one care partner. Please be aware that your care partner may wait in the car in the parking lot or if they feel like they will be too hot to wait in the car, they may wait in the lobby on the 4th floor. All care partners are required to wear a mask the entire time (we do not have any that we can provide them), they need to practice social distancing, and we will do a Covid check for all patient's and care partners when you arrive. Also we will check their temperature and your temperature. If the care partner waits in their car they need to stay in the parking lot the entire time and we will call them on their cell phone when the patient is ready for discharge so they can bring the car to the front of the building. Also all patient's will need to wear a mask into building.

## 2019-09-13 ENCOUNTER — Other Ambulatory Visit (HOSPITAL_COMMUNITY): Payer: BC Managed Care – PPO

## 2019-09-14 ENCOUNTER — Encounter (HOSPITAL_COMMUNITY): Payer: Self-pay | Admitting: Gastroenterology

## 2019-09-14 ENCOUNTER — Other Ambulatory Visit (HOSPITAL_COMMUNITY)
Admission: RE | Admit: 2019-09-14 | Discharge: 2019-09-14 | Disposition: A | Discharge status: Home / Self Care | Payer: BC Managed Care – PPO | Source: Ambulatory Visit | Attending: Gastroenterology | Admitting: Gastroenterology

## 2019-09-14 ENCOUNTER — Other Ambulatory Visit: Payer: Self-pay

## 2019-09-14 DIAGNOSIS — Z01812 Encounter for preprocedural laboratory examination: Secondary | ICD-10-CM | POA: Insufficient documentation

## 2019-09-14 DIAGNOSIS — Z20822 Contact with and (suspected) exposure to covid-19: Secondary | ICD-10-CM | POA: Insufficient documentation

## 2019-09-14 LAB — SARS CORONAVIRUS 2 (TAT 6-24 HRS): SARS Coronavirus 2: NEGATIVE

## 2019-09-14 NOTE — Progress Notes (Signed)
Pt denies SOB, chest pain, and being under the care of a cardiologist. Pt stated that PCP is Dr. Denyce Robert. Pt stated that a  stress test was performed > 10 years ago. Pt denies having an echo and cardiac cath. Pt denies having a chest x ray in the last year. Pt denies recent labs. Pt made aware to stop taking Aspirin (unless otherwise advised by surgeon), vitamins, fish oil and herbal medications. Do not take any NSAIDs ie: Ibuprofen, Advil, Naproxen (Aleve), Motrin, BC and Goody Powder. Pt stated that he was instructed to hold Metformin. Pt stated that he does not check his CBG. Pt reminded to quarantine. Pt verbalized understanding of all pre-op instructions.

## 2019-09-14 NOTE — Progress Notes (Signed)
Pre call for procedure 09/17/19. Patient has had Covid test, reminded to quarantine for weekend.  Will be here with a ride, NPO.

## 2019-09-17 ENCOUNTER — Ambulatory Visit (HOSPITAL_COMMUNITY): Payer: BC Managed Care – PPO | Admitting: Anesthesiology

## 2019-09-17 ENCOUNTER — Encounter (HOSPITAL_COMMUNITY): Payer: Self-pay | Admitting: Gastroenterology

## 2019-09-17 ENCOUNTER — Ambulatory Visit (HOSPITAL_COMMUNITY)
Admission: RE | Admit: 2019-09-17 | Discharge: 2019-09-17 | Disposition: A | Discharge status: Home / Self Care | Payer: BC Managed Care – PPO | Attending: Gastroenterology | Admitting: Gastroenterology

## 2019-09-17 ENCOUNTER — Other Ambulatory Visit: Payer: Self-pay

## 2019-09-17 ENCOUNTER — Encounter (HOSPITAL_COMMUNITY)
Admission: RE | Disposition: A | Discharge status: Home / Self Care | Payer: Self-pay | Source: Home / Self Care | Attending: Gastroenterology

## 2019-09-17 DIAGNOSIS — E1151 Type 2 diabetes mellitus with diabetic peripheral angiopathy without gangrene: Secondary | ICD-10-CM | POA: Insufficient documentation

## 2019-09-17 DIAGNOSIS — Z803 Family history of malignant neoplasm of breast: Secondary | ICD-10-CM | POA: Diagnosis not present

## 2019-09-17 DIAGNOSIS — K7460 Unspecified cirrhosis of liver: Secondary | ICD-10-CM | POA: Insufficient documentation

## 2019-09-17 DIAGNOSIS — D696 Thrombocytopenia, unspecified: Secondary | ICD-10-CM | POA: Diagnosis not present

## 2019-09-17 DIAGNOSIS — B3781 Candidal esophagitis: Secondary | ICD-10-CM | POA: Diagnosis not present

## 2019-09-17 DIAGNOSIS — Z833 Family history of diabetes mellitus: Secondary | ICD-10-CM | POA: Insufficient documentation

## 2019-09-17 DIAGNOSIS — K219 Gastro-esophageal reflux disease without esophagitis: Secondary | ICD-10-CM | POA: Insufficient documentation

## 2019-09-17 DIAGNOSIS — G4730 Sleep apnea, unspecified: Secondary | ICD-10-CM | POA: Diagnosis not present

## 2019-09-17 DIAGNOSIS — K635 Polyp of colon: Secondary | ICD-10-CM | POA: Diagnosis not present

## 2019-09-17 DIAGNOSIS — D120 Benign neoplasm of cecum: Secondary | ICD-10-CM | POA: Insufficient documentation

## 2019-09-17 DIAGNOSIS — Z808 Family history of malignant neoplasm of other organs or systems: Secondary | ICD-10-CM | POA: Insufficient documentation

## 2019-09-17 DIAGNOSIS — K644 Residual hemorrhoidal skin tags: Secondary | ICD-10-CM | POA: Insufficient documentation

## 2019-09-17 DIAGNOSIS — D122 Benign neoplasm of ascending colon: Secondary | ICD-10-CM | POA: Insufficient documentation

## 2019-09-17 DIAGNOSIS — Z87891 Personal history of nicotine dependence: Secondary | ICD-10-CM | POA: Diagnosis not present

## 2019-09-17 DIAGNOSIS — K449 Diaphragmatic hernia without obstruction or gangrene: Secondary | ICD-10-CM | POA: Insufficient documentation

## 2019-09-17 DIAGNOSIS — Z86010 Personal history of colonic polyps: Secondary | ICD-10-CM | POA: Diagnosis not present

## 2019-09-17 DIAGNOSIS — K641 Second degree hemorrhoids: Secondary | ICD-10-CM | POA: Diagnosis not present

## 2019-09-17 DIAGNOSIS — I10 Essential (primary) hypertension: Secondary | ICD-10-CM | POA: Insufficient documentation

## 2019-09-17 HISTORY — PX: SUBMUCOSAL LIFTING INJECTION: SHX6855

## 2019-09-17 HISTORY — PX: COLONOSCOPY WITH PROPOFOL: SHX5780

## 2019-09-17 HISTORY — PX: ENDOSCOPIC MUCOSAL RESECTION: SHX6839

## 2019-09-17 HISTORY — PX: HEMOSTASIS CLIP PLACEMENT: SHX6857

## 2019-09-17 HISTORY — DX: Presence of spectacles and contact lenses: Z97.3

## 2019-09-17 LAB — GLUCOSE, CAPILLARY
Glucose-Capillary: 167 mg/dL — ABNORMAL HIGH (ref 70–99)
Glucose-Capillary: 122 mg/dL — ABNORMAL HIGH (ref 70–99)

## 2019-09-17 SURGERY — COLONOSCOPY WITH PROPOFOL
Anesthesia: Monitor Anesthesia Care

## 2019-09-17 MED ORDER — ESMOLOL HCL 100 MG/10ML IV SOLN
INTRAVENOUS | Status: DC | PRN
  Administered 2019-09-17 (×2): 20 mg via INTRAVENOUS

## 2019-09-17 MED ORDER — SODIUM CHLORIDE 0.9 % IV SOLN: INTRAVENOUS | Status: DC

## 2019-09-17 MED ORDER — SUGAMMADEX SODIUM 200 MG/2ML IV SOLN
INTRAVENOUS | Status: DC | PRN
  Administered 2019-09-17: 200 mg via INTRAVENOUS

## 2019-09-17 MED ORDER — PROPOFOL 10 MG/ML IV BOLUS
INTRAVENOUS | Status: DC | PRN
  Administered 2019-09-17: 15 mg via INTRAVENOUS
  Administered 2019-09-17: 20 mg via INTRAVENOUS
  Administered 2019-09-17: 150 mg via INTRAVENOUS

## 2019-09-17 MED ORDER — LIDOCAINE HCL (CARDIAC) PF 100 MG/5ML IV SOSY
PREFILLED_SYRINGE | INTRAVENOUS | Status: DC | PRN
  Administered 2019-09-17: 80 mg via INTRAVENOUS

## 2019-09-17 MED ORDER — FENTANYL CITRATE (PF) 100 MCG/2ML IJ SOLN
INTRAMUSCULAR | Status: DC | PRN
  Administered 2019-09-17: 100 ug via INTRAVENOUS

## 2019-09-17 MED ORDER — SUCCINYLCHOLINE CHLORIDE 20 MG/ML IJ SOLN
INTRAMUSCULAR | Status: DC | PRN
  Administered 2019-09-17: 120 mg via INTRAVENOUS

## 2019-09-17 MED ORDER — ONDANSETRON HCL 4 MG/2ML IJ SOLN
INTRAMUSCULAR | Status: DC | PRN
  Administered 2019-09-17: 4 mg via INTRAVENOUS

## 2019-09-17 MED ORDER — LACTATED RINGERS IV SOLN
INTRAVENOUS | Status: AC | PRN
  Administered 2019-09-17: 1000 mL via INTRAVENOUS

## 2019-09-17 MED ORDER — DEXAMETHASONE SODIUM PHOSPHATE 10 MG/ML IJ SOLN
INTRAMUSCULAR | Status: DC | PRN
  Administered 2019-09-17: 8 mg via INTRAVENOUS

## 2019-09-17 MED ORDER — ROCURONIUM BROMIDE 100 MG/10ML IV SOLN
INTRAVENOUS | Status: DC | PRN
  Administered 2019-09-17: 60 mg via INTRAVENOUS

## 2019-09-17 MED ORDER — PROPOFOL 500 MG/50ML IV EMUL
INTRAVENOUS | Status: DC | PRN
  Administered 2019-09-17: 100 ug/kg/min via INTRAVENOUS

## 2019-09-17 MED ORDER — DEXMEDETOMIDINE HCL 200 MCG/2ML IV SOLN
INTRAVENOUS | Status: DC | PRN
  Administered 2019-09-17 (×2): 4 ug via INTRAVENOUS
  Administered 2019-09-17: 8 ug via INTRAVENOUS

## 2019-09-17 SURGICAL SUPPLY — 22 items

## 2019-09-17 NOTE — Anesthesia Procedure Notes (Signed)
Procedure Name: Intubation Date/Time: 09/17/2019 12:02 PM Performed by: Mariea Clonts, CRNA Pre-anesthesia Checklist: Patient identified, Emergency Drugs available, Suction available and Patient being monitored Patient Re-evaluated:Patient Re-evaluated prior to induction Oxygen Delivery Method: Circle System Utilized and Circle system utilized Preoxygenation: Pre-oxygenation with 100% oxygen Induction Type: IV induction, Cricoid Pressure applied and Rapid sequence Laryngoscope Size: Miller and 2 Grade View: Grade II Tube type: Oral Tube size: 7.5 mm Number of attempts: 1 Airway Equipment and Method: Stylet and Oral airway Placement Confirmation: ETT inserted through vocal cords under direct vision,  positive ETCO2 and breath sounds checked- equal and bilateral Tube secured with: Tape Dental Injury: Teeth and Oropharynx as per pre-operative assessment

## 2019-09-17 NOTE — H&P (Signed)
GASTROENTEROLOGY PROCEDURE H&P NOTE   Primary Care Physician: Mellody Dance, DO  HPI: [name] is a 54 y.o. male who presents for Colonoscopy with EMR attempt of AC/Cecal polyp.  Past Medical History:  Diagnosis Date  . Cirrhosis ([place])   . Colon polyps   . Diabetes mellitus without complication ([place])   . Esophageal candidiasis ([place])   . GERD (gastroesophageal reflux disease)    as a child, some as an adult  . Hypertension   . Sleep apnea    does not use Cpap  . Thrombocytopenia ([place])   . Wears glasses    Past Surgical History:  Procedure Laterality Date  . APPENDECTOMY    . BACK SURGERY    . COLONOSCOPY    . COLONOSCOPY WITH PROPOFOL N/A 07/02/2019   Procedure: COLONOSCOPY WITH PROPOFOL;  Surgeon: Rush Landmark Telford Nab., MD;  Location: [facility];  Service: Gastroenterology;  Laterality: N/A;   No current facility-administered medications for this encounter.   No Known Allergies Family History  Problem Relation Age of Onset  . Brain cancer Mother   . Diabetes Father   . Breast cancer Sister   . Colon cancer Neg Hx   . Liver cancer Neg Hx   . Esophageal cancer Neg Hx   . Inflammatory bowel disease Neg Hx   . Liver disease Neg Hx   . Pancreatic cancer Neg Hx   . Rectal cancer Neg Hx   . Stomach cancer Neg Hx   . Colon polyps Neg Hx    Social History   Socioeconomic History  . Marital status: Married    Spouse name: Not on file  . Number of children: 1  . Years of education: Not on file  . Highest education level: Not on file  Occupational History  . Occupation: unemployed  Tobacco Use  . Smoking status: Former Smoker    Types: Cigarettes    Quit date: 06/23/1995    Years since quitting: 24.2  . Smokeless tobacco: Current User    Types: Chew  Substance and Sexual Activity  . Alcohol use: Yes    Comment: 3 drinks a month  . Drug use: Yes    Types: Marijuana  . Sexual activity: Yes    Birth control/protection: None  Other Topics Concern    . Not on file  Social History Narrative  . Not on file   Social Determinants of Health   Financial Resource Strain:   . Difficulty of Paying Living Expenses:   Food Insecurity:   . Worried About Charity fundraiser in the Last Year:   . Arboriculturist in the Last Year:   Transportation Needs:   . Film/video editor (Medical):   Marland Kitchen Lack of Transportation (Non-Medical):   Physical Activity:   . Days of Exercise per Week:   . Minutes of Exercise per Session:   Stress:   . Feeling of Stress :   Social Connections:   . Frequency of Communication with Friends and Family:   . Frequency of Social Gatherings with Friends and Family:   . Attends Religious Services:   . Active Member of Clubs or Organizations:   . Attends Archivist Meetings:   Marland Kitchen Marital Status:   Intimate Partner Violence:   . Fear of Current or Ex-Partner:   . Emotionally Abused:   Marland Kitchen Physically Abused:   . Sexually Abused:     Physical Exam: Vital signs in last 24 hours:  GEN: NAD EYE: Sclerae anicteric ENT: MMM CV: Non-tachycardic GI: Soft, NT/ND NEURO:  Alert & Oriented x 3  Lab Results: No results for input(s): WBC, HGB, HCT, PLT in the last 72 hours. BMET No results for input(s): NA, K, CL, CO2, GLUCOSE, BUN, CREATININE, CALCIUM in the last 72 hours. LFT No results for input(s): PROT, ALBUMIN, AST, ALT, ALKPHOS, BILITOT, BILIDIR, IBILI in the last 72 hours. PT/INR No results for input(s): LABPROT, INR in the last 72 hours.   Impression / Plan: This is a 54 y.o.male who presents for Colonoscopy with EMR attempt of AC/Cecal polyp.  The risks and benefits of endoscopic evaluation were discussed with the patient; these include but are not limited to the risk of perforation, infection, bleeding, missed lesions, lack of diagnosis, severe illness requiring hospitalization, as well as anesthesia and sedation related illnesses.  The patient is agreeable to proceed.    Justice Britain, MD [facility] Office # [phone]

## 2019-09-17 NOTE — Op Note (Signed)
[facility] Patient Name: [name] Procedure Date : 09/17/2019 MRN: [id number] Attending MD: Justice Britain , MD Date of Birth: 17-Dec-1965 CSN: [id number] Age: 54 Admit Type: Outpatient Procedure:                Colonoscopy Indications:              Excision of colonic polyp Providers:                Justice Britain, MD, Burtis Junes, RN, Lazaro Arms,                            Technician, Virgilio Belling. Huel Cote, CRNA Referring MD:             Carlota Raspberry. Havery Moros, MD, Mellody Dance Medicines:                Monitored Anesthesia Care was initiated, however                            there was concern for laryngospasm very early on in                            the procedure (within 10 minutes) that it was felt                            to give [place] best opportunity to complete procedure                            successfully, General Anesthesia was then pursued                            and the patient was intubated Complications:            No immediate complications. Estimated Blood Loss:     Estimated blood loss was minimal. Procedure:                Pre-Anesthesia Assessment:                           - Prior to the procedure, a History and Physical                            was performed, and patient medications and                            allergies were reviewed. The patient's tolerance of                            previous anesthesia was also reviewed. The risks                            and benefits of the procedure and the sedation                            options and risks were discussed with the patient.  All questions were answered, and informed consent                            was obtained. Prior Anticoagulants: The patient has                            taken no previous anticoagulant or antiplatelet                            agents. ASA Grade Assessment: II - A patient with                            mild systemic  disease. After reviewing the risks                            and benefits, the patient was deemed in                            satisfactory condition to undergo the procedure.                           After obtaining informed consent, the colonoscope                            was passed under direct vision. Throughout the                            procedure, the patient's blood pressure, pulse, and                            oxygen saturations were monitored continuously. The                            CF-HQ190L ([id number]) Olympus colonoscope was                            introduced through the anus and advanced to the the                            cecum, identified by appendiceal orifice and                            ileocecal valve. The colonoscopy was technically                            difficult and complex due to a redundant colon and                            significant looping. Successful completion of the                            procedure was aided by increasing the dose of  sedation medication, changing the patient's                            position, using manual pressure, withdrawing and                            reinserting the scope, straightening and shortening                            the scope to obtain bowel loop reduction and using                            scope torsion. The patient tolerated the procedure.                            The quality of the bowel preparation was adequate.                            The ileocecal valve, appendiceal orifice, and                            rectum were photographed. Scope In: 11:49:25 AM Scope Out: 1:24:23 PM Scope Withdrawal Time: 1 hour 29 minutes 48 seconds  Total Procedure Duration: 1 hour 34 minutes 58 seconds  Findings:      The digital rectal exam findings include hemorrhoids. Pertinent       negatives include no palpable rectal lesions.      A large amount of liquid  semi-liquid stool was found in the entire       colon, interfering with visualization. Lavage of the area was performed       using copious amounts, resulting in clearance with adequate       visualization.      A 45 mm polyp was found in the proximal ascending colon into the cecum.       The polyp was mixed lateral spreading. Preparations were made for       mucosal resection. Orise gel was injected to raise the lesion. Piecemeal       mucosal resection using a snare was performed. Resection and retrieval       were complete. The entire resection could not be closed. However, to try       and prevent bleeding post-intervention, six hemostatic clips were       successfully placed (MR conditional) in regions in effort of decreasing       risk of bleeding. There was no bleeding at the end of the procedure.      Normal mucosa was found in the entire colon otherwise, though extensive       lavage was required for this to be done to get adequate visualization.      Non-bleeding non-thrombosed external and internal hemorrhoids were found       during retroflexion, during perianal exam and during digital exam. The       hemorrhoids were Grade II (internal hemorrhoids that prolapse but reduce       spontaneously). Impression:               - Hemorrhoids found on digital rectal exam.                           -  Stool in the entire examined colon - lavaged                            copiously for adequate visualization..                           - One 45 mm polyp in the proximal ascending colon                            in the cecum, removed with piecemeal mucosal                            resection. Resected and retrieved. Clips (MR                            conditional) were placed.                           - Normal mucosa in the entire examined colon                            otherwise.                           - Non-bleeding non-thrombosed external and internal                             hemorrhoids. Recommendation:           - The patient will be observed post-procedure,                            until all discharge criteria are met.                           - Discharge patient to home.                           - Patient has a contact number available for                            emergencies. The signs and symptoms of potential                            delayed complications were discussed with the                            patient. Return to normal activities tomorrow.                            Written discharge instructions were provided to the                            patient.                           -  High fiber diet.                           - Continue present medications.                           - No aspirin, ibuprofen, naproxen, or other                            non-steroidal anti-inflammatory drugs for 2 weeks.                           - Await pathology results.                           - Repeat colonoscopy in 6 months for surveillance                            and have Endorotor and FTRD available as well as                            Avulsion. Recommend at least this type of                            Preparation that we did for today, but ideally he                            works on further constipation regimen with priamry                            GI.                           - Patient may have sore throat for next few days so                            would use Chloraseptic or Cepacol lozenges PRN.                           - Monitor for signs/symptoms of                            bleeding/perforation/aspiration.                           - The findings and recommendations were discussed                            with the patient.                           - The findings and recommendations were discussed                            with the patient's family. Procedure Code(s):        --- Professional ---  46503, Colonoscopy, flexible; with endoscopic                            mucosal resection Diagnosis Code(s):        --- Professional ---                           K64.1, Second degree hemorrhoids                           K63.5, Polyp of colon CPT copyright 2019 American Medical Association. All rights reserved. The codes documented in this report are preliminary and upon coder review may  be revised to meet current compliance requirements. Justice Britain, MD 09/17/2019 1:41:52 PM Number of Addenda: 0

## 2019-09-17 NOTE — Transfer of Care (Signed)
Immediate Anesthesia Transfer of Care Note  Patient: [name]  Procedure(s) Performed: COLONOSCOPY WITH PROPOFOL (N/A ) ENDOSCOPIC MUCOSAL RESECTION (N/A ) SUBMUCOSAL LIFTING INJECTION  Patient Location: PACU  Anesthesia Type:MAC and General  Level of Consciousness: awake, alert  and oriented  Airway & Oxygen Therapy: Patient Spontanous Breathing and Patient connected to nasal cannula oxygen  Post-op Assessment: Report given to RN, Post -op Vital signs reviewed and stable and Patient moving all extremities X 4  Post vital signs: Reviewed and stable  Last Vitals:  Vitals Value Taken Time  BP 150/83 09/17/19 1340  Temp 36.4 C 09/17/19 1340  Pulse 90 09/17/19 1344  Resp 22 09/17/19 1344  SpO2 100 % 09/17/19 1344  Vitals shown include unvalidated device data.  Last Pain:  Vitals:   09/17/19 1340  TempSrc:   PainSc: 0-No pain         Complications: No apparent anesthesia complications

## 2019-09-17 NOTE — Anesthesia Procedure Notes (Signed)
Procedure Name: MAC Date/Time: 09/17/2019 11:40 AM Performed by: Mariea Clonts, CRNA Pre-anesthesia Checklist: Patient identified, Emergency Drugs available, Suction available, Patient being monitored and Timeout performed Patient Re-evaluated:Patient Re-evaluated prior to induction Oxygen Delivery Method: Simple face mask and Nasal cannula

## 2019-09-17 NOTE — Anesthesia Preprocedure Evaluation (Signed)
Anesthesia Evaluation  Patient identified by MRN, date of birth, ID band Patient awake    Reviewed: Allergy & Precautions, H&P , NPO status , Patient's Chart, lab work & pertinent test results, reviewed documented beta blocker date and time   Airway Mallampati: II  TM Distance: >3 FB Neck ROM: Full    Dental no notable dental hx. (+) Teeth Intact, Dental Advisory Given   Pulmonary sleep apnea and Continuous Positive Airway Pressure Ventilation , former smoker,    Pulmonary exam normal breath sounds clear to auscultation       Cardiovascular hypertension, Pt. on medications and Pt. on home beta blockers + Peripheral Vascular Disease   Rhythm:Regular Rate:Normal     Neuro/Psych negative neurological ROS  negative psych ROS   GI/Hepatic Neg liver ROS, hiatal hernia, GERD  Medicated,  Endo/Other  diabetes, Type 2, Oral Hypoglycemic Agents  Renal/GU negative Renal ROS  negative genitourinary   Musculoskeletal   Abdominal   Peds  Hematology negative hematology ROS (+)   Anesthesia Other Findings   Reproductive/Obstetrics negative OB ROS                             Anesthesia Physical Anesthesia Plan  ASA: III  Anesthesia Plan: MAC   Post-op Pain Management:    Induction: Intravenous  PONV Risk Score and Plan: 1 and Propofol infusion  Airway Management Planned: Simple Face Mask  Additional Equipment:   Intra-op Plan:   Post-operative Plan:   Informed Consent: I have reviewed the patients History and Physical, chart, labs and discussed the procedure including the risks, benefits and alternatives for the proposed anesthesia with the patient or authorized representative who has indicated his/her understanding and acceptance.     Dental advisory given  Plan Discussed with: CRNA  Anesthesia Plan Comments:         Anesthesia Quick Evaluation

## 2019-09-17 NOTE — Anesthesia Postprocedure Evaluation (Signed)
Anesthesia Post Note  Patient: [name]  Procedure(s) Performed: COLONOSCOPY WITH PROPOFOL (N/A ) ENDOSCOPIC MUCOSAL RESECTION (N/A ) SUBMUCOSAL LIFTING INJECTION     Patient location during evaluation: PACU Anesthesia Type: MAC and General Level of consciousness: awake Pain management: pain level controlled Vital Signs Assessment: post-procedure vital signs reviewed and stable Respiratory status: spontaneous breathing, nonlabored ventilation, respiratory function stable and patient connected to nasal cannula oxygen Cardiovascular status: blood pressure returned to baseline and stable Postop Assessment: no apparent nausea or vomiting Anesthetic complications: no    Last Vitals:  Vitals:   09/17/19 1404 09/17/19 1415  BP: (!) 149/80 (!) 148/78  Pulse: 90 88  Resp: 20 19  Temp: 36.4 C 36.5 C  SpO2: 99% 100%    Last Pain:  Vitals:   09/17/19 1415  TempSrc:   PainSc: 0-No pain                 Nori Poland P Atharva Mirsky

## 2019-09-18 ENCOUNTER — Telehealth: Payer: Self-pay | Admitting: Gastroenterology

## 2019-09-18 LAB — SURGICAL PATHOLOGY

## 2019-09-18 NOTE — Telephone Encounter (Signed)
The pt had colonoscopy yesterday and states today he is having a lot of gas pains.  I advised that he walk as much as possible to move that gas and he can try gas ex and hot tea.  He states he is also having panic attacks.  I advised him to call his PCP in regards to the panic attacks and I will pass this information on to Dr Rush Landmark for further recommendations.

## 2019-09-18 NOTE — Telephone Encounter (Signed)
Thank you for update. I was able to call and contact the patient this afternoon. Symptoms are slightly improved from this morning. He has had a bowel movement. Was able to burp and pass significant eructation and gas in that way and feels somewhat better. He is eating and drinking. Recommended to patient that he try simethicone/Gas-X 125 mg 3-4 times daily scheduled every 6 hours and at bedtime for next 72 hours. Patty, please reach out to patient tomorrow and if his symptoms are continuing to improve and we will continue on our current plan. If patient's symptoms are not significantly improving then would move forward with obtaining a 3 view abdominal x-ray (supine/standing/decubitus) to rule out perforation although seems unlikely based on his ability to tolerate oral intake. Patient appreciative for the call back earlier this morning and this afternoon. Please keep me up-to-date. Thanks. GM

## 2019-09-18 NOTE — Telephone Encounter (Signed)
Pt's wife Laqueta Linden states that pt has been experiencing upset stomach and vomiting since this morning. Pt had colonoscopy yesterday with Dr. Rush Landmark. She would like some advise.

## 2019-09-19 ENCOUNTER — Encounter: Payer: Self-pay | Admitting: Gastroenterology

## 2019-10-30 ENCOUNTER — Other Ambulatory Visit: Payer: Self-pay

## 2019-10-30 DIAGNOSIS — D649 Anemia, unspecified: Secondary | ICD-10-CM

## 2019-10-30 DIAGNOSIS — K7460 Unspecified cirrhosis of liver: Secondary | ICD-10-CM

## 2019-10-30 NOTE — Progress Notes (Signed)
Labs ordered and RUQ U/S order placed.   Scheduled pt for U/S at [facility] on Tuesday, 7-13 at  8am. To Arrive at 7:45am. NPO after midnight. Letter mailed to patient and letter sent via [place].

## 2019-11-13 ENCOUNTER — Ambulatory Visit (HOSPITAL_COMMUNITY): Admission: RE | Admit: 2019-11-13 | Payer: BC Managed Care – PPO | Source: Ambulatory Visit

## 2020-01-16 ENCOUNTER — Ambulatory Visit (INDEPENDENT_AMBULATORY_CARE_PROVIDER_SITE_OTHER): Payer: BC Managed Care – PPO

## 2020-01-16 ENCOUNTER — Other Ambulatory Visit: Payer: Self-pay

## 2020-01-16 ENCOUNTER — Encounter (HOSPITAL_COMMUNITY): Payer: Self-pay

## 2020-01-16 ENCOUNTER — Ambulatory Visit (HOSPITAL_COMMUNITY)
Admission: EM | Admit: 2020-01-16 | Discharge: 2020-01-16 | Disposition: A | Discharge status: Home / Self Care | Payer: BC Managed Care – PPO | Attending: Family Medicine | Admitting: Family Medicine

## 2020-01-16 DIAGNOSIS — M546 Pain in thoracic spine: Secondary | ICD-10-CM | POA: Diagnosis not present

## 2020-01-16 DIAGNOSIS — Z760 Encounter for issue of repeat prescription: Secondary | ICD-10-CM

## 2020-01-16 DIAGNOSIS — I152 Hypertension secondary to endocrine disorders: Secondary | ICD-10-CM

## 2020-01-16 DIAGNOSIS — E1159 Type 2 diabetes mellitus with other circulatory complications: Secondary | ICD-10-CM

## 2020-01-16 DIAGNOSIS — M545 Low back pain, unspecified: Secondary | ICD-10-CM

## 2020-01-16 DIAGNOSIS — E1169 Type 2 diabetes mellitus with other specified complication: Secondary | ICD-10-CM

## 2020-01-16 DIAGNOSIS — E559 Vitamin D deficiency, unspecified: Secondary | ICD-10-CM

## 2020-01-16 DIAGNOSIS — M542 Cervicalgia: Secondary | ICD-10-CM

## 2020-01-16 MED ORDER — FUROSEMIDE 20 MG PO TABS: 20.0000 mg | ORAL_TABLET | Freq: Every day | ORAL | 1 refills | Status: DC | PRN

## 2020-01-16 MED ORDER — KETOROLAC TROMETHAMINE 30 MG/ML IJ SOLN: 30.0000 mg | Freq: Once | INTRAMUSCULAR | Status: DC

## 2020-01-16 MED ORDER — GABAPENTIN 600 MG PO TABS: ORAL_TABLET | ORAL | 0 refills | Status: DC

## 2020-01-16 MED ORDER — METFORMIN HCL 500 MG PO TABS: 500.0000 mg | ORAL_TABLET | Freq: Two times a day (BID) | ORAL | 1 refills | Status: DC

## 2020-01-16 MED ORDER — IBUPROFEN 800 MG PO TABS: 800.0000 mg | ORAL_TABLET | Freq: Three times a day (TID) | ORAL | 0 refills | Status: DC

## 2020-01-16 MED ORDER — LOSARTAN POTASSIUM 50 MG PO TABS: 50.0000 mg | ORAL_TABLET | Freq: Every day | ORAL | 1 refills | Status: DC

## 2020-01-16 MED ORDER — TIZANIDINE HCL 4 MG PO TABS: 4.0000 mg | ORAL_TABLET | Freq: Four times a day (QID) | ORAL | 0 refills | Status: DC | PRN

## 2020-01-16 MED ORDER — ATORVASTATIN CALCIUM 20 MG PO TABS: 20.0000 mg | ORAL_TABLET | Freq: Every day | ORAL | 0 refills | Status: DC

## 2020-01-16 MED ORDER — POLYETHYLENE GLYCOL 3350 17 G PO PACK: 17.0000 g | PACK | Freq: Every day | ORAL | Status: DC | PRN

## 2020-01-16 MED ORDER — VITAMIN D (ERGOCALCIFEROL) 1.25 MG (50000 UNIT) PO CAPS: 50000.0000 [IU] | ORAL_CAPSULE | ORAL | 1 refills | Status: DC

## 2020-01-16 MED ORDER — TRAMADOL HCL 50 MG PO TABS: 50.0000 mg | ORAL_TABLET | Freq: Four times a day (QID) | ORAL | 0 refills | Status: DC | PRN

## 2020-01-16 NOTE — Discharge Instructions (Signed)
Nothing concerning on her x-rays.  I have prescribed ibuprofen 800 mg every 8 hours for pain, inflammation and Zanaflex for muscle relaxant.  Recommended alternate heat and ice.  I have refilled your medications.  Contacts given for primary care for follow-up.
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
## 2020-01-24 ENCOUNTER — Other Ambulatory Visit: Payer: Self-pay

## 2020-01-24 ENCOUNTER — Ambulatory Visit (INDEPENDENT_AMBULATORY_CARE_PROVIDER_SITE_OTHER): Payer: BC Managed Care – PPO | Admitting: Physician Assistant

## 2020-01-24 ENCOUNTER — Encounter: Payer: Self-pay | Admitting: Physician Assistant

## 2020-01-24 VITALS — BP 126/70 | HR 70 | Ht 70.0 in | Wt 228.4 lb

## 2020-01-24 DIAGNOSIS — M542 Cervicalgia: Secondary | ICD-10-CM

## 2020-01-24 DIAGNOSIS — E1169 Type 2 diabetes mellitus with other specified complication: Secondary | ICD-10-CM | POA: Diagnosis not present

## 2020-01-24 DIAGNOSIS — M5136 Other intervertebral disc degeneration, lumbar region: Secondary | ICD-10-CM

## 2020-01-24 DIAGNOSIS — M25512 Pain in left shoulder: Secondary | ICD-10-CM

## 2020-01-24 DIAGNOSIS — M5412 Radiculopathy, cervical region: Secondary | ICD-10-CM

## 2020-01-24 MED ORDER — DEXCOM G6 SENSOR MISC: 1 refills | Status: DC
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
## 2020-01-25 LAB — COMPREHENSIVE METABOLIC PANEL
ALT: 33 IU/L (ref 0–44)
AST: 35 IU/L (ref 0–40)
Albumin/Globulin Ratio: 1 — ABNORMAL LOW (ref 1.2–2.2)
Albumin: 3.6 g/dL — ABNORMAL LOW (ref 3.8–4.9)
Alkaline Phosphatase: 192 IU/L — ABNORMAL HIGH (ref 44–121)
BUN/Creatinine Ratio: 5 — ABNORMAL LOW (ref 9–20)
BUN: 6 mg/dL (ref 6–24)
Bilirubin Total: 0.9 mg/dL (ref 0.0–1.2)
CO2: 29 mmol/L (ref 20–29)
Calcium: 9.3 mg/dL (ref 8.7–10.2)
Chloride: 100 mmol/L (ref 96–106)
Creatinine, Ser: 1.12 mg/dL (ref 0.76–1.27)
GFR calc Af Amer: 87 mL/min/{1.73_m2} (ref >59)
GFR calc non Af Amer: 76 mL/min/{1.73_m2} (ref >59)
Globulin, Total: 3.5 g/dL (ref 1.5–4.5)
Glucose: 197 mg/dL — ABNORMAL HIGH (ref 65–99)
Potassium: 3.8 mmol/L (ref 3.5–5.2)
Sodium: 137 mmol/L (ref 134–144)
Total Protein: 7.1 g/dL (ref 6.0–8.5)

## 2020-01-25 LAB — HEMOGLOBIN A1C
Est. average glucose Bld gHb Est-mCnc: 194 mg/dL
Hgb A1c MFr Bld: 8.4 % — ABNORMAL HIGH (ref 4.8–5.6)

## 2020-01-30 ENCOUNTER — Telehealth: Payer: Self-pay | Admitting: Physician Assistant

## 2020-01-30 DIAGNOSIS — E1169 Type 2 diabetes mellitus with other specified complication: Secondary | ICD-10-CM

## 2020-01-30 MED ORDER — TRULICITY 0.75 MG/0.5ML ~~LOC~~ SOAJ: 0.7500 mg | SUBCUTANEOUS | 3 refills | Status: DC

## 2020-01-30 NOTE — Telephone Encounter (Signed)
Sent prescription for Trulicity

## 2020-02-18 ENCOUNTER — Ambulatory Visit
Admission: RE | Admit: 2020-02-18 | Discharge: 2020-02-18 | Disposition: A | Discharge status: Home / Self Care | Payer: BC Managed Care – PPO | Source: Ambulatory Visit | Attending: Physician Assistant | Admitting: Physician Assistant

## 2020-02-18 DIAGNOSIS — M542 Cervicalgia: Secondary | ICD-10-CM

## 2020-02-18 DIAGNOSIS — M5412 Radiculopathy, cervical region: Secondary | ICD-10-CM

## 2020-02-19 ENCOUNTER — Other Ambulatory Visit: Payer: BC Managed Care – PPO

## 2020-02-20 ENCOUNTER — Encounter: Payer: Self-pay | Admitting: Orthopaedic Surgery

## 2020-02-20 ENCOUNTER — Other Ambulatory Visit: Payer: Self-pay

## 2020-02-20 ENCOUNTER — Ambulatory Visit (INDEPENDENT_AMBULATORY_CARE_PROVIDER_SITE_OTHER): Payer: BC Managed Care – PPO | Admitting: Orthopaedic Surgery

## 2020-02-20 VITALS — Ht 71.0 in | Wt 225.0 lb

## 2020-02-20 DIAGNOSIS — M5441 Lumbago with sciatica, right side: Secondary | ICD-10-CM

## 2020-02-20 DIAGNOSIS — M542 Cervicalgia: Secondary | ICD-10-CM

## 2020-02-20 DIAGNOSIS — M25512 Pain in left shoulder: Secondary | ICD-10-CM | POA: Diagnosis not present

## 2020-02-20 DIAGNOSIS — M5442 Lumbago with sciatica, left side: Secondary | ICD-10-CM | POA: Diagnosis not present

## 2020-02-20 MED ORDER — METHOCARBAMOL 500 MG PO TABS: 500.0000 mg | ORAL_TABLET | Freq: Four times a day (QID) | ORAL | 1 refills | Status: DC | PRN

## 2020-02-20 NOTE — Addendum Note (Signed)
Addended by: Meyer Cory on: 02/20/2020 02:30 PM   Modules accepted: Orders
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
## 2020-02-22 ENCOUNTER — Telehealth: Payer: Self-pay

## 2020-02-22 NOTE — Telephone Encounter (Signed)
Per [facility] the methocarbamol is not covered by insurance. This was review by Dr. Ninfa Linden who stated no muscle relaxer then at this time. I have contacted [facility] to let them know. Pt was notified as well and stated understanding

## 2020-03-05 ENCOUNTER — Ambulatory Visit: Payer: BC Managed Care – PPO | Admitting: Orthopaedic Surgery

## 2020-03-11 ENCOUNTER — Ambulatory Visit: Payer: BC Managed Care – PPO | Admitting: Rehabilitative and Restorative Service Providers"

## 2020-03-11 ENCOUNTER — Encounter: Payer: Self-pay | Admitting: Orthopaedic Surgery

## 2020-03-11 ENCOUNTER — Ambulatory Visit (INDEPENDENT_AMBULATORY_CARE_PROVIDER_SITE_OTHER): Payer: BC Managed Care – PPO | Admitting: Orthopaedic Surgery

## 2020-03-11 DIAGNOSIS — M542 Cervicalgia: Secondary | ICD-10-CM

## 2020-03-11 DIAGNOSIS — M5441 Lumbago with sciatica, right side: Secondary | ICD-10-CM | POA: Diagnosis not present

## 2020-03-11 DIAGNOSIS — M5442 Lumbago with sciatica, left side: Secondary | ICD-10-CM | POA: Diagnosis not present

## 2020-03-11 DIAGNOSIS — M25512 Pain in left shoulder: Secondary | ICD-10-CM | POA: Diagnosis not present
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
## 2020-03-12 ENCOUNTER — Other Ambulatory Visit: Payer: BC Managed Care – PPO

## 2020-03-25 ENCOUNTER — Ambulatory Visit: Payer: BC Managed Care – PPO | Admitting: Orthopaedic Surgery

## 2020-04-28 ENCOUNTER — Telehealth: Payer: Self-pay | Admitting: Physician Assistant

## 2020-04-28 ENCOUNTER — Encounter: Payer: Self-pay | Admitting: Podiatry

## 2020-04-28 ENCOUNTER — Ambulatory Visit (INDEPENDENT_AMBULATORY_CARE_PROVIDER_SITE_OTHER): Payer: BC Managed Care – PPO | Admitting: Podiatry

## 2020-04-28 ENCOUNTER — Other Ambulatory Visit: Payer: Self-pay

## 2020-04-28 DIAGNOSIS — E1169 Type 2 diabetes mellitus with other specified complication: Secondary | ICD-10-CM | POA: Diagnosis not present

## 2020-04-28 DIAGNOSIS — L97512 Non-pressure chronic ulcer of other part of right foot with fat layer exposed: Secondary | ICD-10-CM | POA: Diagnosis not present

## 2020-04-28 DIAGNOSIS — Z7189 Other specified counseling: Secondary | ICD-10-CM

## 2020-04-28 MED ORDER — DOXYCYCLINE HYCLATE 100 MG PO TABS: 100.0000 mg | ORAL_TABLET | Freq: Two times a day (BID) | ORAL | 0 refills | Status: AC

## 2020-04-28 NOTE — Progress Notes (Signed)
Subjective:  Patient ID: [name], male    DOB: 1965/09/01,  MRN: [id number]  Chief Complaint  Patient presents with  . Wound Check    Pt. Is here for a open wound on his right bottom foot. Wound is associated with pain, drainage and bleeding. Pt. States the wound opened 2 days ago.     54 y.o. male presents for wound care.  Patient presents with complaint of a new ulceration to the right first metatarsophalangeal joint.  Patient states has been going for quite some time.  Patient stated opened up 2 days ago has been associated pain drainage and bleeding.  He is a diabetic with last A1c of 8.4.  Patient states that there was some bruising associated with it due to the brace that he was given.  He has not been doing anything besides doing some peroxide treatment.  He denies any other acute complaints.  He would like to discuss treatment options.   Review of Systems: Negative except as noted in the HPI. Denies N/V/F/Ch.  Past Medical History:  Diagnosis Date  . Cirrhosis ([place])   . Colon polyps   . Diabetes mellitus without complication ([place])   . Esophageal candidiasis ([place])   . GERD (gastroesophageal reflux disease)    as a child, some as an adult  . Hypertension   . Sleep apnea    does not use Cpap  . Thrombocytopenia ([place])   . Wears glasses     Current Outpatient Medications:  .  doxycycline (VIBRA-TABS) 100 MG tablet, Take 1 tablet (100 mg total) by mouth 2 (two) times daily for 14 days., Disp: 20 tablet, Rfl: 0 .  doxycycline (VIBRA-TABS) 100 MG tablet, Take 100 mg by mouth 2 (two) times daily., Disp: , Rfl:  .  atorvastatin (LIPITOR) 20 MG tablet, Take 1 tablet (20 mg total) by mouth at bedtime., Disp: 90 tablet, Rfl: 0 .  Continuous Blood Gluc Sensor (DEXCOM G6 SENSOR) MISC, Use to check blood sugars continuously. Change every 7 days., Disp: 12 each, Rfl: 1 .  Dulaglutide (TRULICITY) A999333 [id number] SOPN, Inject 0.75 mg into the skin once a week., Disp: 2 mL, Rfl: 3 .   furosemide (LASIX) 20 MG tablet, Take 1 tablet (20 mg total) by mouth daily as needed for edema., Disp: 30 tablet, Rfl: 1 .  gabapentin (NEURONTIN) 600 MG tablet, 2 tablets every morning, 2 tablets every afternoon, 3 tablets nightly., Disp: 630 tablet, Rfl: 0 .  ibuprofen (ADVIL) 800 MG tablet, Take 1 tablet (800 mg total) by mouth 3 (three) times daily. (Patient not taking: Reported on 02/20/2020), Disp: 21 tablet, Rfl: 0 .  losartan (COZAAR) 50 MG tablet, Take 1 tablet (50 mg total) by mouth daily., Disp: 30 tablet, Rfl: 1 .  metFORMIN (GLUCOPHAGE) 500 MG tablet, Take 1 tablet (500 mg total) by mouth in the morning and at bedtime., Disp: 30 tablet, Rfl: 1 .  methocarbamol (ROBAXIN) 500 MG tablet, Take 1 tablet (500 mg total) by mouth every 6 (six) hours as needed., Disp: 40 tablet, Rfl: 1 .  metoCLOPramide (REGLAN) 5 MG tablet, Take 1 tablet (5 mg total) by mouth 3 (three) times daily before meals., Disp: 90 tablet, Rfl: 0 .  omeprazole (PRILOSEC) 40 MG capsule, Take 1 capsule (40 mg total) by mouth daily. (Patient taking differently: Take 40 mg by mouth daily as needed (acid reflux/indigestion.). ), Disp: 30 capsule, Rfl: 3 .  polyethylene glycol (MIRALAX) 17 g packet, Take 17 g by mouth daily  as needed for moderate constipation. Increase as needed, Disp: , Rfl:  .  tiZANidine (ZANAFLEX) 4 MG tablet, Take 1 tablet (4 mg total) by mouth every 6 (six) hours as needed for muscle spasms., Disp: 30 tablet, Rfl: 0 .  traMADol (ULTRAM) 50 MG tablet, Take 1 tablet (50 mg total) by mouth every 6 (six) hours as needed., Disp: 15 tablet, Rfl: 0 .  Vitamin D, Ergocalciferol, (DRISDOL) 1.25 MG (50000 UNIT) CAPS capsule, Take 1 capsule (50,000 Units total) by mouth 2 (two) times a week. Wednesdays & Sundays., Disp: 30 capsule, Rfl: 1  Social History   Tobacco Use  Smoking Status Former Smoker  . Types: Cigarettes  . Quit date: 06/23/1995  . Years since quitting: 24.8  Smokeless Tobacco Current User  .  Types: Chew    No Known Allergies Objective:  There were no vitals filed for this visit. There is no height or weight on file to calculate BMI. Constitutional Well developed. Well nourished.  Vascular Dorsalis pedis pulses faintly palpable bilaterally. Posterior tibial pulses faintly palpable bilaterally. Capillary refill normal to all digits.  No cyanosis or clubbing noted. Pedal hair growth normal.  Neurologic Normal speech. Oriented to person, place, and time. Protective sensation absent  Dermatologic Wound Location: Right first metatarsophalangeal joint wound with fat layer exposed Wound Base: Fibrotic slough Peri-wound: Calloused Exudate: Scant/small amount Serous exudate Wound Measurements: -See below  Orthopedic: No pain to palpation either foot.   Radiographs: None Assessment:   1. Foot ulcer with fat layer exposed, right ([name])   2. Type 2 diabetes mellitus with other specified complication, without long-term current use of insulin ([name])   3. Diabetes education, encounter for    Plan:  Patient was evaluated and treated and all questions answered.  Ulcer right first metatarsophalangeal joint wound with fat layer exposed -Debridement as below. -Dressed with Betadine wet-to-dry, DSD. -Continue off-loading with surgical shoe. -Patient is a high risk of undergoing amputation of part of the foot as opposed to losing the whole foot.  I discussed this with the patient in extensive detail.  I discussed with him the importance of diabetic glucose management and encouraged him to follow-up with his primary care physician to help control the glucose level.  Patient states understanding will do so.  I discussed him the importance of disc orthotics as well as shoe gear modification to take the stress off of the first metatarsophalangeal joint he understands that.  Procedure: Excisional Debridement of Wound Tool: Sharp chisel blade/tissue nipper Rationale: Removal of non-viable  soft tissue from the wound to promote healing.  Anesthesia: none Pre-Debridement Wound Measurements: 2 cm x 1 cm x 0.4 cm  Post-Debridement Wound Measurements: 2.2 cm x 1.1 cm x 0.6 cm  Type of Debridement: Sharp Excisional Tissue Removed: Non-viable soft tissue Blood loss: Minimal (<50cc) Depth of Debridement: subcutaneous tissue. Technique: Sharp excisional debridement to bleeding, viable wound base.  Wound Progress: This is my initial evaluation.  I will continue to monitor the progression of it. Site healing conversation 7 Dressing: Dry, sterile, compression dressing. Disposition: Patient tolerated procedure well. Patient to return in 1 week for follow-up.  No follow-ups on file.

## 2020-04-29 NOTE — Telephone Encounter (Signed)
Urgent care recommended

## 2020-05-21 ENCOUNTER — Ambulatory Visit: Payer: BC Managed Care – PPO | Admitting: Podiatry

## 2020-05-21 ENCOUNTER — Other Ambulatory Visit: Payer: Self-pay

## 2020-05-21 DIAGNOSIS — L97512 Non-pressure chronic ulcer of other part of right foot with fat layer exposed: Secondary | ICD-10-CM

## 2020-05-21 DIAGNOSIS — E1169 Type 2 diabetes mellitus with other specified complication: Secondary | ICD-10-CM

## 2020-05-22 ENCOUNTER — Encounter: Payer: Self-pay | Admitting: Podiatry

## 2020-05-22 NOTE — Progress Notes (Signed)
Subjective:  Patient ID: [name], male    DOB: November 03, 1965,  MRN: [id number]  Chief Complaint  Patient presents with  . Foot Ulcer    2 week follow up    55 y.o. male presents for wound care.  Patient presents with a follow-up of right first metatarsophalangeal joint ulcer.  Patient states is doing well.  He has been doing Betadine wet-to-dry dressing has dried it up really well.  He has been ambulating with surgical shoe.  He denies any other acute complaints..   Review of Systems: Negative except as noted in the HPI. Denies N/V/F/Ch.  Past Medical History:  Diagnosis Date  . Cirrhosis ([place])   . Colon polyps   . Diabetes mellitus without complication ([place])   . Esophageal candidiasis ([place])   . GERD (gastroesophageal reflux disease)    as a child, some as an adult  . Hypertension   . Sleep apnea    does not use Cpap  . Thrombocytopenia ([place])   . Wears glasses     Current Outpatient Medications:  .  atorvastatin (LIPITOR) 20 MG tablet, Take 1 tablet (20 mg total) by mouth at bedtime., Disp: 90 tablet, Rfl: 0 .  Continuous Blood Gluc Sensor (DEXCOM G6 SENSOR) MISC, Use to check blood sugars continuously. Change every 7 days., Disp: 12 each, Rfl: 1 .  doxycycline (VIBRA-TABS) 100 MG tablet, Take 100 mg by mouth 2 (two) times daily., Disp: , Rfl:  .  Dulaglutide (TRULICITY) 8.10 FB/5.1WC SOPN, Inject 0.75 mg into the skin once a week., Disp: 2 mL, Rfl: 3 .  furosemide (LASIX) 20 MG tablet, Take 1 tablet (20 mg total) by mouth daily as needed for edema., Disp: 30 tablet, Rfl: 1 .  gabapentin (NEURONTIN) 600 MG tablet, 2 tablets every morning, 2 tablets every afternoon, 3 tablets nightly., Disp: 630 tablet, Rfl: 0 .  ibuprofen (ADVIL) 800 MG tablet, Take 1 tablet (800 mg total) by mouth 3 (three) times daily. (Patient not taking: Reported on 02/20/2020), Disp: 21 tablet, Rfl: 0 .  losartan (COZAAR) 50 MG tablet, Take 1 tablet (50 mg total) by mouth daily., Disp: 30 tablet, Rfl:  1 .  metFORMIN (GLUCOPHAGE) 500 MG tablet, Take 1 tablet (500 mg total) by mouth in the morning and at bedtime., Disp: 30 tablet, Rfl: 1 .  methocarbamol (ROBAXIN) 500 MG tablet, Take 1 tablet (500 mg total) by mouth every 6 (six) hours as needed., Disp: 40 tablet, Rfl: 1 .  metoCLOPramide (REGLAN) 5 MG tablet, Take 1 tablet (5 mg total) by mouth 3 (three) times daily before meals., Disp: 90 tablet, Rfl: 0 .  omeprazole (PRILOSEC) 40 MG capsule, Take 1 capsule (40 mg total) by mouth daily. (Patient taking differently: Take 40 mg by mouth daily as needed (acid reflux/indigestion.). ), Disp: 30 capsule, Rfl: 3 .  polyethylene glycol (MIRALAX) 17 g packet, Take 17 g by mouth daily as needed for moderate constipation. Increase as needed, Disp: , Rfl:  .  tiZANidine (ZANAFLEX) 4 MG tablet, Take 1 tablet (4 mg total) by mouth every 6 (six) hours as needed for muscle spasms., Disp: 30 tablet, Rfl: 0 .  traMADol (ULTRAM) 50 MG tablet, Take 1 tablet (50 mg total) by mouth every 6 (six) hours as needed., Disp: 15 tablet, Rfl: 0 .  Vitamin D, Ergocalciferol, (DRISDOL) 1.25 MG (50000 UNIT) CAPS capsule, Take 1 capsule (50,000 Units total) by mouth 2 (two) times a week. Wednesdays & Sundays., Disp: 30 capsule, Rfl: 1  Social History   Tobacco Use  Smoking Status Former Smoker  . Types: Cigarettes  . Quit date: 06/23/1995  . Years since quitting: 24.9  Smokeless Tobacco Current User  . Types: Chew    No Known Allergies Objective:  There were no vitals filed for this visit. There is no height or weight on file to calculate BMI. Constitutional Well developed. Well nourished.  Vascular Dorsalis pedis pulses faintly palpable bilaterally. Posterior tibial pulses faintly palpable bilaterally. Capillary refill normal to all digits.  No cyanosis or clubbing noted. Pedal hair growth normal.  Neurologic Normal speech. Oriented to person, place, and time. Protective sensation absent  Dermatologic Wound  Location: Right first metatarsophalangeal joint wound with fat layer exposed Wound Base: Fibrotic slough Peri-wound: Calloused Exudate: Scant/small amount Serous exudate Wound Measurements: -See below  Orthopedic: No pain to palpation either foot.   Radiographs: None Assessment:   1. Foot ulcer with fat layer exposed, right ([place])   2. Type 2 diabetes mellitus with other specified complication, without long-term current use of insulin ([place])    Plan:  Patient was evaluated and treated and all questions answered.  Ulcer right first metatarsophalangeal joint wound with fat layer exposed -Debridement as below. -Dressed with Betadine wet-to-dry, DSD. -Continue off-loading with surgical shoe. -Patient is a high risk of undergoing amputation of part of the foot as opposed to losing the whole foot.  I discussed this with the patient in extensive detail.  I discussed with him the importance of diabetic glucose management and encouraged him to follow-up with his primary care physician to help control the glucose level.  Patient states understanding will do so.  I discussed him the importance of disc orthotics as well as shoe gear modification to take the stress off of the first metatarsophalangeal joint he understands that.  Procedure: Excisional Debridement of Wound Tool: Sharp chisel blade/tissue nipper Rationale: Removal of non-viable soft tissue from the wound to promote healing.  Anesthesia: none Pre-Debridement Wound Measurements: 1 cm x 0.7 cm x 0.3 cm Post-Debridement Wound Measurements: 1.3 cm x 0.8 cm x 0.3 cm Type of Debridement: Sharp Excisional Tissue Removed: Non-viable soft tissue Blood loss: Minimal (<50cc) Depth of Debridement: subcutaneous tissue. Technique: Sharp excisional debridement to bleeding, viable wound base.  Wound Progress: The wound is decreasing per measurements. Site healing conversation 7 Dressing: Dry, sterile, compression dressing. Disposition: Patient  tolerated procedure well. Patient to return in 1 week for follow-up.  No follow-ups on file.

## 2020-06-06 ENCOUNTER — Encounter: Payer: Self-pay | Admitting: Podiatry

## 2020-06-06 ENCOUNTER — Other Ambulatory Visit: Payer: Self-pay

## 2020-06-06 ENCOUNTER — Ambulatory Visit: Payer: BC Managed Care – PPO | Admitting: Podiatry

## 2020-06-06 ENCOUNTER — Telehealth: Payer: Self-pay | Admitting: Physician Assistant

## 2020-06-06 DIAGNOSIS — L97512 Non-pressure chronic ulcer of other part of right foot with fat layer exposed: Secondary | ICD-10-CM | POA: Diagnosis not present

## 2020-06-06 DIAGNOSIS — E1169 Type 2 diabetes mellitus with other specified complication: Secondary | ICD-10-CM

## 2020-06-06 NOTE — Progress Notes (Signed)
Subjective:  Patient ID: [name], male    DOB: 09/04/1965,  MRN: [id number]  Chief Complaint  Patient presents with  . Foot Ulcer    PT stated that he is doing well and he has no pain     55 y.o. male presents for wound care.  Patient presents with a follow-up of right first metatarsophalangeal joint ulcer.  Patient states is doing well.  He has been doing dressing changes he denies acute complaints he has been ambulating with surgical shoe.   Review of Systems: Negative except as noted in the HPI. Denies N/V/F/Ch.  Past Medical History:  Diagnosis Date  . Cirrhosis ([place])   . Colon polyps   . Diabetes mellitus without complication ([place])   . Esophageal candidiasis ([place])   . GERD (gastroesophageal reflux disease)    as a child, some as an adult  . Hypertension   . Sleep apnea    does not use Cpap  . Thrombocytopenia ([place])   . Wears glasses     Current Outpatient Medications:  .  atorvastatin (LIPITOR) 20 MG tablet, Take 1 tablet (20 mg total) by mouth at bedtime., Disp: 90 tablet, Rfl: 0 .  Continuous Blood Gluc Sensor (DEXCOM G6 SENSOR) MISC, Use to check blood sugars continuously. Change every 7 days., Disp: 12 each, Rfl: 1 .  doxycycline (VIBRA-TABS) 100 MG tablet, Take 100 mg by mouth 2 (two) times daily., Disp: , Rfl:  .  Dulaglutide (TRULICITY) 0.63 KZ/6.0FU SOPN, Inject 0.75 mg into the skin once a week., Disp: 2 mL, Rfl: 3 .  furosemide (LASIX) 20 MG tablet, Take 1 tablet (20 mg total) by mouth daily as needed for edema., Disp: 30 tablet, Rfl: 1 .  gabapentin (NEURONTIN) 600 MG tablet, 2 tablets every morning, 2 tablets every afternoon, 3 tablets nightly., Disp: 630 tablet, Rfl: 0 .  ibuprofen (ADVIL) 800 MG tablet, Take 1 tablet (800 mg total) by mouth 3 (three) times daily. (Patient not taking: Reported on 02/20/2020), Disp: 21 tablet, Rfl: 0 .  losartan (COZAAR) 50 MG tablet, Take 1 tablet (50 mg total) by mouth daily., Disp: 30 tablet, Rfl: 1 .  metFORMIN  (GLUCOPHAGE) 500 MG tablet, Take 1 tablet (500 mg total) by mouth in the morning and at bedtime., Disp: 30 tablet, Rfl: 1 .  methocarbamol (ROBAXIN) 500 MG tablet, Take 1 tablet (500 mg total) by mouth every 6 (six) hours as needed., Disp: 40 tablet, Rfl: 1 .  metoCLOPramide (REGLAN) 5 MG tablet, Take 1 tablet (5 mg total) by mouth 3 (three) times daily before meals., Disp: 90 tablet, Rfl: 0 .  omeprazole (PRILOSEC) 40 MG capsule, Take 1 capsule (40 mg total) by mouth daily. (Patient taking differently: Take 40 mg by mouth daily as needed (acid reflux/indigestion.). ), Disp: 30 capsule, Rfl: 3 .  polyethylene glycol (MIRALAX) 17 g packet, Take 17 g by mouth daily as needed for moderate constipation. Increase as needed, Disp: , Rfl:  .  tiZANidine (ZANAFLEX) 4 MG tablet, Take 1 tablet (4 mg total) by mouth every 6 (six) hours as needed for muscle spasms., Disp: 30 tablet, Rfl: 0 .  traMADol (ULTRAM) 50 MG tablet, Take 1 tablet (50 mg total) by mouth every 6 (six) hours as needed., Disp: 15 tablet, Rfl: 0 .  Vitamin D, Ergocalciferol, (DRISDOL) 1.25 MG (50000 UNIT) CAPS capsule, Take 1 capsule (50,000 Units total) by mouth 2 (two) times a week. Wednesdays & Sundays., Disp: 30 capsule, Rfl: 1  Social History  Tobacco Use  Smoking Status Former Smoker  . Types: Cigarettes  . Quit date: 06/23/1995  . Years since quitting: 24.9  Smokeless Tobacco Current User  . Types: Chew    No Known Allergies Objective:  There were no vitals filed for this visit. There is no height or weight on file to calculate BMI. Constitutional Well developed. Well nourished.  Vascular Dorsalis pedis pulses faintly palpable bilaterally. Posterior tibial pulses faintly palpable bilaterally. Capillary refill normal to all digits.  No cyanosis or clubbing noted. Pedal hair growth normal.  Neurologic Normal speech. Oriented to person, place, and time. Protective sensation absent  Dermatologic  right submetatarsal 1  ulceration completely healed.  Skin completely epithelialized.  No clinical signs of infection noted.  Orthopedic: No pain to palpation either foot.   Radiographs: None Assessment:   1. Foot ulcer with fat layer exposed, right ([place])   2. Type 2 diabetes mellitus with other specified complication, without long-term current use of insulin ([place])    Plan:  Patient was evaluated and treated and all questions answered.  Ulcer right first metatarsophalangeal joint wound with fat layer exposed -Clinically healed and reepithelialized.  I discussed with the patient the importance of wearing bracing to offload the pressure.  He states understanding. -He will be scheduled to see Liliane Channel to make an adjustment to the brace as it is not providing enough offloading pressure. No follow-ups on file.

## 2020-06-06 NOTE — Telephone Encounter (Signed)
Per Herb Grays we are not going to refill these medications since they have not been prescribed by her in past, medication has not been discussed with her and patient has been non-compliant with follow up.

## 2020-06-06 NOTE — Telephone Encounter (Signed)
Patient is aware of the below and follow up apt moved to sooner date. Patient was upset. AS, CMA

## 2020-06-16 IMAGING — MR MRI OF THE RIGHT FOREFOOT WITHOUT AND WITH CONTRAST
4 of 9 series · 19 of 40 positions shown · IV contrast (gadavist)
Comparison: Right foot x-rays dated August 31, 2018.

CLINICAL DATA: Diabetic foot ulcer.

EXAM:
MRI OF THE RIGHT FOREFOOT WITHOUT AND WITH CONTRAST
TECHNIQUE: Multiplanar, multisequence MR imaging of the right forefoot was
performed before and after the administration of intravenous
contrast.
CONTRAST:  10 mL Gadavist intravenous contrast.

[Series 3: T1 · coronal · 3.0mm · 0.27mm/px · 5 of 52 slices shown (1 of 2)]
[im 1/52]
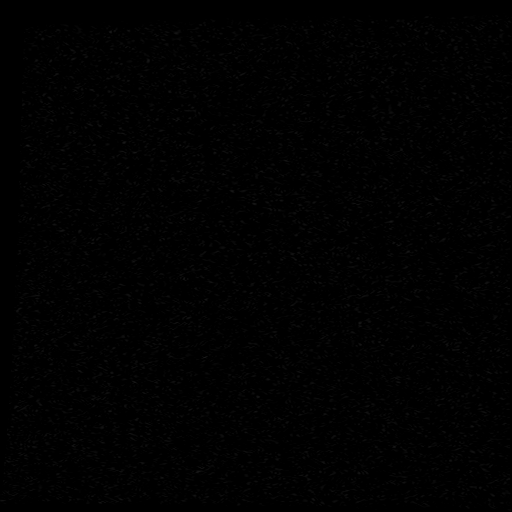
[im 13/52]
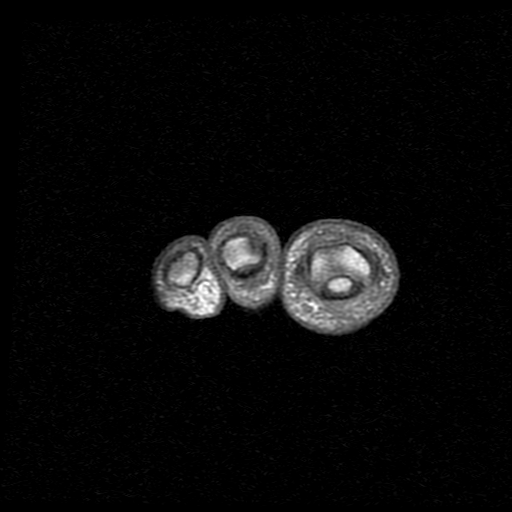
[im 26/52]
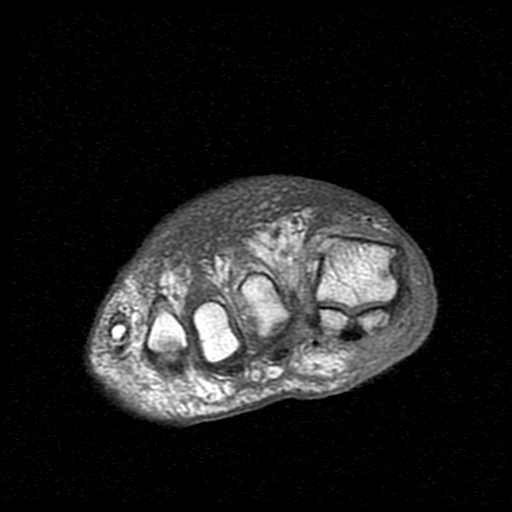
[im 39/52]
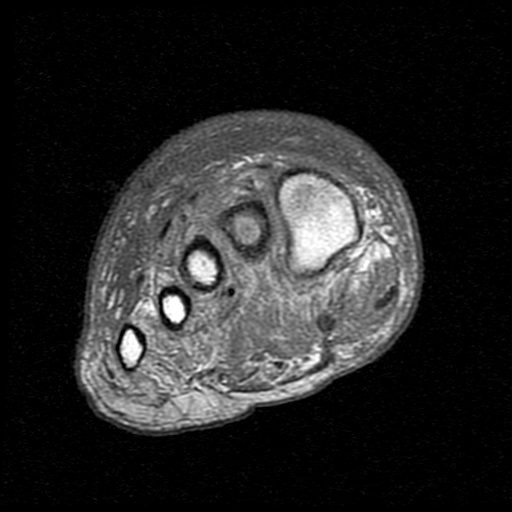
[im 52/52]
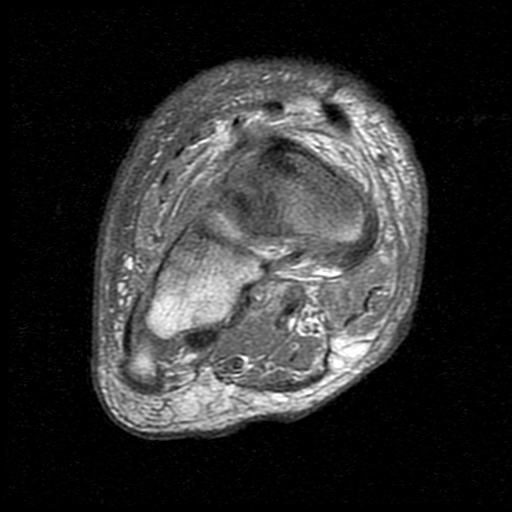

[Series 6: T1 · oblique · 3.0mm · 0.37mm/px · 3 of 30 slices shown (2 of 2)]
[im 1/30]
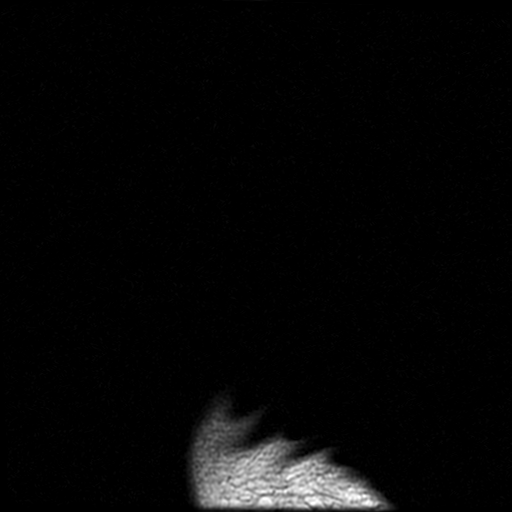
[im 15/30]
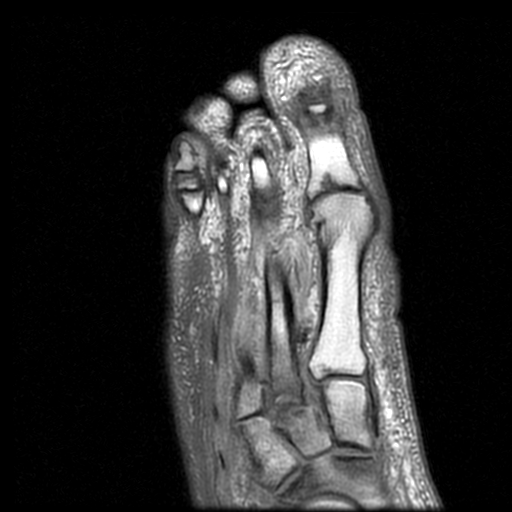
[im 30/30]
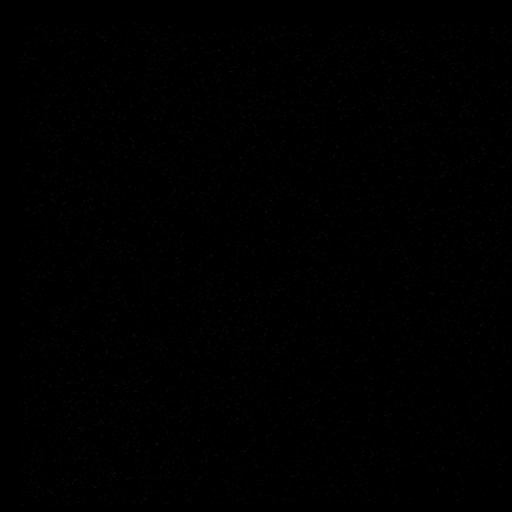

[Series 8: T1 fat-sat · coronal · non-contrast · 3.0mm · 0.27mm/px · 6 of 52 slices shown]
[im 1/52]
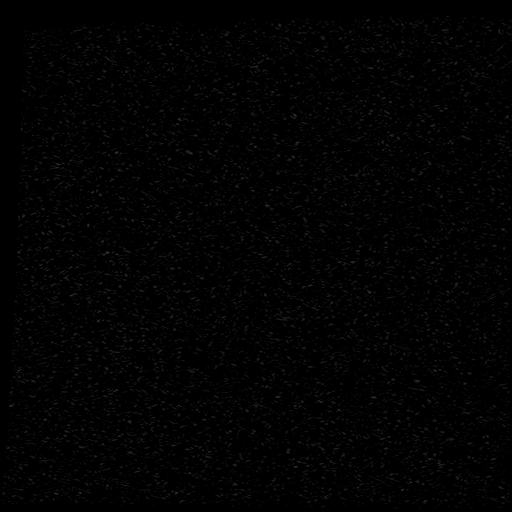
[im 11/52]
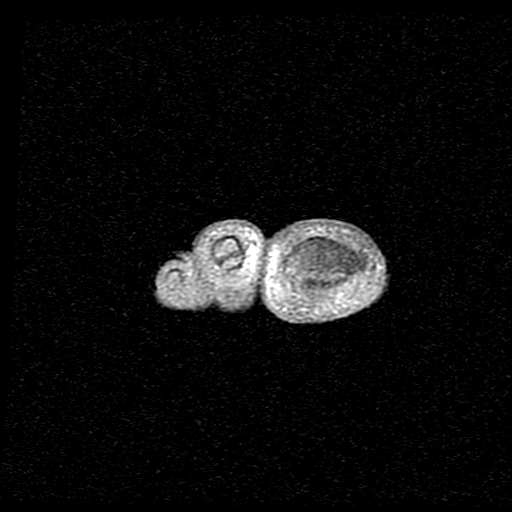
[im 21/52]
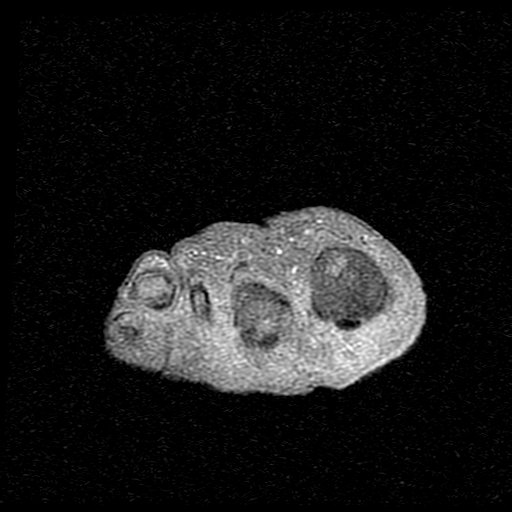
[im 31/52]
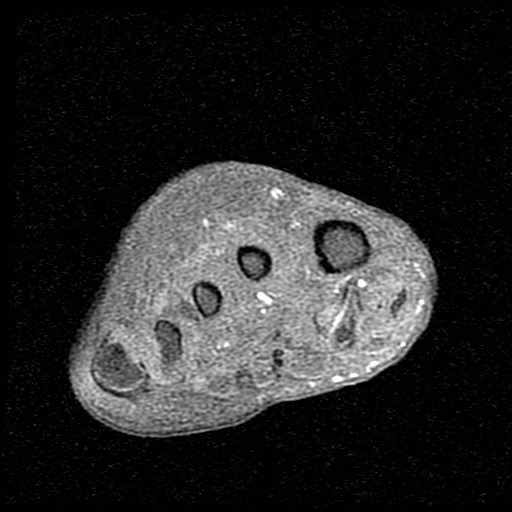
[im 41/52]
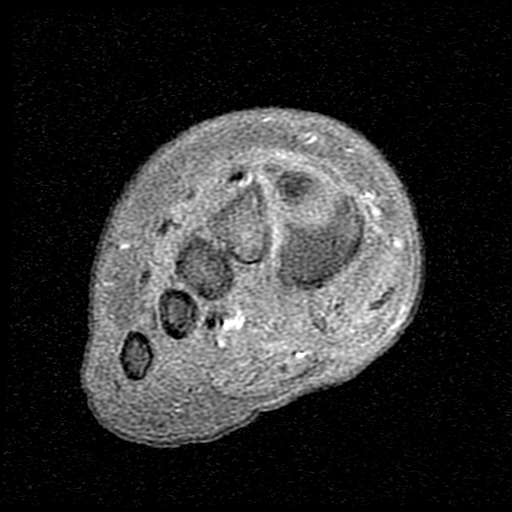
[im 52/52]
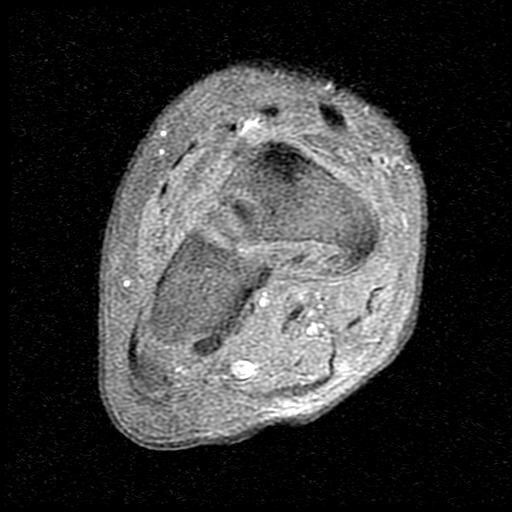

[Series 9: T1 fat-sat post-contrast · coronal · 3.0mm · 0.27mm/px · 5 of 52 slices shown]
[im 1/52]
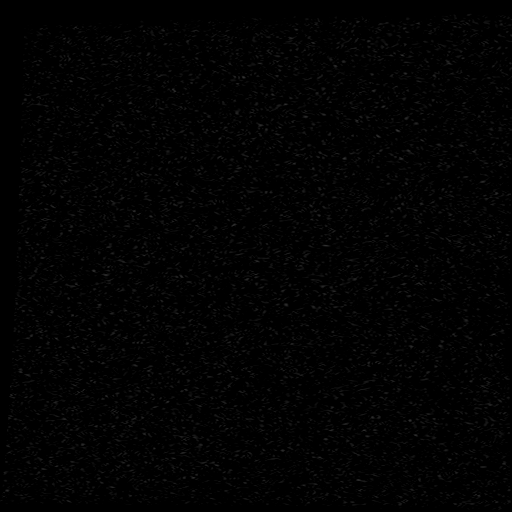
[im 11/52]
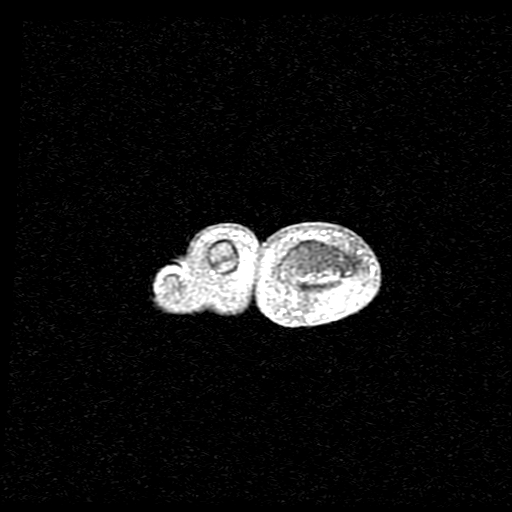
[im 21/52]
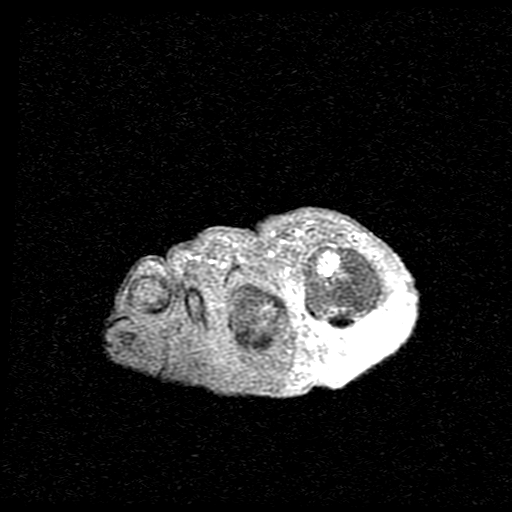
[im 31/52]
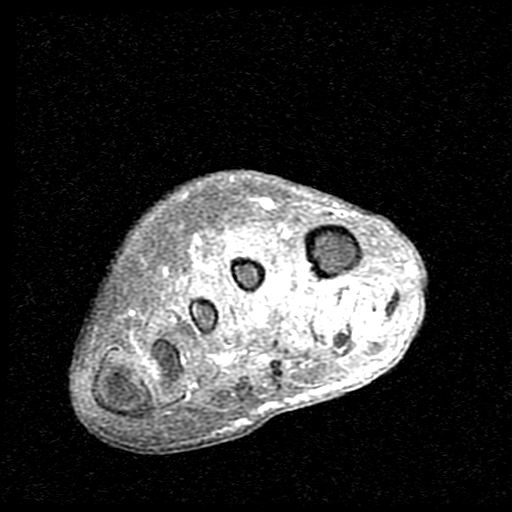
[im 52/52]
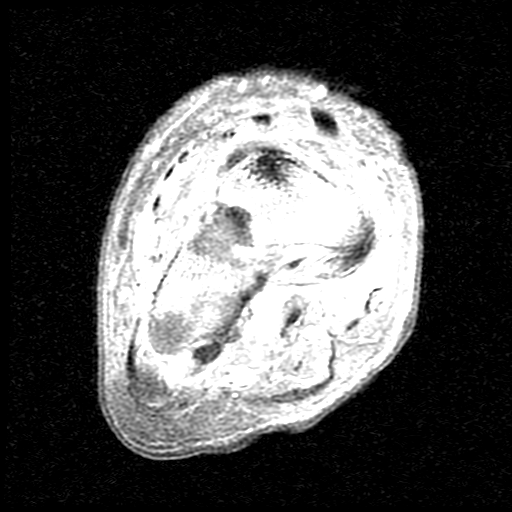

[19 of 40 positions shown; findings below may reference images not displayed]

FINDINGS: Bones/Joint/Cartilage

Patchy marrow edema and enhancement involving the base of the second
and third metatarsals, cuneiforms, cuboid, and navicular. Second TMT
joint space narrowing. Nondisplaced stress fracture at the base of
the second metatarsal. Slight lateral subluxation of the second
metatarsal base with respect to the middle cuneiform. Mild first MTP
joint osteoarthritis. No joint effusion.

Ligaments

Collateral ligaments are intact.

Muscles and Tendons
Flexor, peroneal and extensor compartment tendons are intact.
Increased T2 signal and enhancement within the intrinsic muscles of
the forefoot, nonspecific, but likely related to diabetic muscle
changes.

Soft tissue
Small ulceration at the plantar base of the first MTP joint.
Prominent non-enhancing dorsal forefoot soft tissue swelling. No
fluid collection or hematoma. No soft tissue mass.
IMPRESSION: 1. Small ulceration at the plantar base of the first MTP joint
without underlying osteomyelitis. No abscess.
2. Patchy marrow edema and enhancement of the midfoot with second
TMT joint space narrowing and slight lateral subluxation, as well as
a suspected nondisplaced stress fracture at the base of the second
metatarsal. Findings are most consistent with neuropathic
arthropathy.

## 2020-06-18 ENCOUNTER — Encounter: Payer: Self-pay | Admitting: Physician Assistant

## 2020-06-18 ENCOUNTER — Other Ambulatory Visit: Payer: Self-pay

## 2020-06-18 ENCOUNTER — Ambulatory Visit: Payer: BC Managed Care – PPO | Admitting: Physician Assistant

## 2020-06-18 VITALS — BP 154/76 | HR 89 | Temp 97.4°F | Ht 71.0 in | Wt 225.6 lb

## 2020-06-18 DIAGNOSIS — M7989 Other specified soft tissue disorders: Secondary | ICD-10-CM

## 2020-06-18 DIAGNOSIS — E782 Mixed hyperlipidemia: Secondary | ICD-10-CM

## 2020-06-18 DIAGNOSIS — I152 Hypertension secondary to endocrine disorders: Secondary | ICD-10-CM

## 2020-06-18 DIAGNOSIS — E1142 Type 2 diabetes mellitus with diabetic polyneuropathy: Secondary | ICD-10-CM

## 2020-06-18 DIAGNOSIS — E1159 Type 2 diabetes mellitus with other circulatory complications: Secondary | ICD-10-CM

## 2020-06-18 DIAGNOSIS — K3184 Gastroparesis: Secondary | ICD-10-CM

## 2020-06-18 DIAGNOSIS — E1169 Type 2 diabetes mellitus with other specified complication: Secondary | ICD-10-CM

## 2020-06-18 LAB — POCT GLYCOSYLATED HEMOGLOBIN (HGB A1C): Hemoglobin A1C: 7.9 % — AB (ref 4.0–5.6)

## 2020-06-18 MED ORDER — LOSARTAN POTASSIUM 50 MG PO TABS: 50.0000 mg | ORAL_TABLET | Freq: Every day | ORAL | 0 refills | Status: DC

## 2020-06-18 MED ORDER — FUROSEMIDE 20 MG PO TABS: 20.0000 mg | ORAL_TABLET | Freq: Every day | ORAL | 0 refills | Status: DC | PRN

## 2020-06-18 MED ORDER — BLOOD GLUCOSE METER KIT: PACK | 0 refills | Status: DC

## 2020-06-18 MED ORDER — METOCLOPRAMIDE HCL 10 MG PO TABS: 10.0000 mg | ORAL_TABLET | Freq: Three times a day (TID) | ORAL | 0 refills | Status: DC

## 2020-06-18 MED ORDER — GABAPENTIN 600 MG PO TABS: ORAL_TABLET | ORAL | 0 refills | Status: DC

## 2020-06-18 NOTE — Assessment & Plan Note (Addendum)
-  A1c today 7.9, has improved from 8.4.  Patient recently started Trulicity so we will continue with current medication regimen.  Will repeat A1c in 3 months and make medication adjustments if A1c fails to improve or worsen. -Recommend to start ambulatory glucose monitoring.  Dexcom sensor not covered by insurance so will provide prescription for glucometer kit with testing supplies. -Monitor/reduce carbohydrates and glucose, especially sweet tea.

## 2020-06-18 NOTE — Assessment & Plan Note (Signed)
-  Last direct LDL 46. -Continue current medication regimen.  -Advised patient to schedule lab visit 1 wk before next appointment to repeat lipid panel and hepatic function.

## 2020-06-18 NOTE — Assessment & Plan Note (Signed)
>>  ASSESSMENT AND PLAN FOR DIABETIC PERIPHERAL NEUROPATHY ASSOCIATED WITH TYPE 2 DIABETES MELLITUS WRITTEN ON 06/18/2020  5:54 PM BY ABONZA, MARITZA, PA-C  -Foot exam performed today, abnormal.  Patient recently treated by podiatry for foot ulcer which has healed.  Discussed with patient the importance of controlling diabetes to reduce further complications such as progressive neuropathy. -Continue current medication regimen.  Provided refill. -Will continue to monitor.

## 2020-06-18 NOTE — Progress Notes (Addendum)
Established Patient Office Visit  Subjective:  Patient ID: [name], male    DOB: 04-30-1966  Age: 55 y.o. MRN: [id number]  CC:  Chief Complaint  Patient presents with  . Diabetes  . Hypertension  . Hyperlipidemia    HPI [name] presents for follow up on diabetes mellitus, hypertension and hyperlipidemia.  Diabetes: Pt denies increased urination or thirst. Pt reports recently started Trulicity. Stopped Metformin due to nausea and unable to keep medication in system. Does report a few episodes of feeling his sugar is low when not being able to eat. Does not check glucose at home. Dexcom sensor was not approved by insurance. Continues to have issue with nausea and being able to eat. States when eating food his stomach will get upset and feels like food is stuck so stops eating. Has been seen by Gastroenterology and given Reglan 5 mg and Omeprazole which did not help. He drinks sweet tea and has been trying to drink more water, 3-4 20 oz water bottles/day. Verbalizes needs to do better with lifestyle habits. Drinks Ensure or protein drink to help substitute meal. Thinks he might have a hernia which is noticeable when bending his head and can be painful.   HTN: Pt denies chest pain, palpitations, dizziness. Does report sharp eye pain that lasts for 10-15 seconds and happens rarely. Taking medication as directed without side effects. Does not check BP at home. Pt follows a low salt diet.  HLD: Pt taking medication as directed without issues. Denies side effects including myalgias and RUQ pain.   Neuropathy: Takes Gabapentin for nerve pain and numbness of lower extremity. States has also started having sharp pain of fingers/hands.   Past Medical History:  Diagnosis Date  . Cirrhosis ([place])   . Colon polyps   . Diabetes mellitus without complication ([place])   . Esophageal candidiasis ([place])   . GERD (gastroesophageal reflux disease)    as a child, some as an adult  . Hypertension    . Sleep apnea    does not use Cpap  . Thrombocytopenia ([place])   . Wears glasses     Past Surgical History:  Procedure Laterality Date  . APPENDECTOMY    . BACK SURGERY    . COLONOSCOPY    . COLONOSCOPY WITH PROPOFOL N/A 07/02/2019   Procedure: COLONOSCOPY WITH PROPOFOL;  Surgeon: Rush Landmark Telford Nab., MD;  Location: [facility];  Service: Gastroenterology;  Laterality: N/A;  . COLONOSCOPY WITH PROPOFOL N/A 09/17/2019   Procedure: COLONOSCOPY WITH PROPOFOL;  Surgeon: Rush Landmark Telford Nab., MD;  Location: [facility];  Service: Gastroenterology;  Laterality: N/A;  . ENDOSCOPIC MUCOSAL RESECTION N/A 09/17/2019   Procedure: ENDOSCOPIC MUCOSAL RESECTION;  Surgeon: Rush Landmark Telford Nab., MD;  Location: [facility];  Service: Gastroenterology;  Laterality: N/A;  . HEMOSTASIS CLIP PLACEMENT  09/17/2019   Procedure: HEMOSTASIS CLIP PLACEMENT;  Surgeon: Irving Copas., MD;  Location: [facility];  Service: Gastroenterology;;  . Lia Foyer LIFTING INJECTION  09/17/2019   Procedure: SUBMUCOSAL LIFTING INJECTION;  Surgeon: Irving Copas., MD;  Location: [facility] ENDOSCOPY;  Service: Gastroenterology;;    Family History  Problem Relation Age of Onset  . Brain cancer Mother   . Diabetes Father   . Breast cancer Sister   . Colon cancer Neg Hx   . Liver cancer Neg Hx   . Esophageal cancer Neg Hx   . Inflammatory bowel disease Neg Hx   . Liver disease Neg Hx   . Pancreatic cancer Neg Hx   .  Rectal cancer Neg Hx   . Stomach cancer Neg Hx   . Colon polyps Neg Hx     Social History   Socioeconomic History  . Marital status: Married    Spouse name: Not on file  . Number of children: 1  . Years of education: Not on file  . Highest education level: Not on file  Occupational History  . Occupation: unemployed  Tobacco Use  . Smoking status: Former Smoker    Types: Cigarettes    Quit date: 06/23/1995    Years since quitting: 25.0  . Smokeless tobacco: Current User     Types: Chew  Vaping Use  . Vaping Use: Never used  Substance and Sexual Activity  . Alcohol use: Yes    Comment: 3 drinks a month  . Drug use: Yes    Types: Marijuana  . Sexual activity: Yes    Birth control/protection: None  Other Topics Concern  . Not on file  Social History Narrative  . Not on file   Social Determinants of Health   Financial Resource Strain: Not on file  Food Insecurity: Not on file  Transportation Needs: Not on file  Physical Activity: Not on file  Stress: Not on file  Social Connections: Not on file  Intimate Partner Violence: Not on file    Outpatient Medications Prior to Visit  Medication Sig Dispense Refill  . atorvastatin (LIPITOR) 20 MG tablet Take 1 tablet (20 mg total) by mouth at bedtime. 90 tablet 0  . Dulaglutide (TRULICITY) 8.29 FA/2.1HY SOPN Inject 0.75 mg into the skin once a week. 2 mL 3  . ibuprofen (ADVIL) 800 MG tablet Take 1 tablet (800 mg total) by mouth 3 (three) times daily. 21 tablet 0  . polyethylene glycol (MIRALAX) 17 g packet Take 17 g by mouth daily as needed for moderate constipation. Increase as needed    . Vitamin D, Ergocalciferol, (DRISDOL) 1.25 MG (50000 UNIT) CAPS capsule Take 1 capsule (50,000 Units total) by mouth 2 (two) times a week. Wednesdays & Sundays. 30 capsule 1  . furosemide (LASIX) 20 MG tablet Take 1 tablet (20 mg total) by mouth daily as needed for edema. 30 tablet 1  . gabapentin (NEURONTIN) 600 MG tablet 2 tablets every morning, 2 tablets every afternoon, 3 tablets nightly. 630 tablet 0  . losartan (COZAAR) 50 MG tablet Take 1 tablet (50 mg total) by mouth daily. 30 tablet 1  . Continuous Blood Gluc Sensor (DEXCOM G6 SENSOR) MISC Use to check blood sugars continuously. Change every 7 days. (Patient not taking: Reported on 06/18/2020) 12 each 1  . doxycycline (VIBRA-TABS) 100 MG tablet Take 100 mg by mouth 2 (two) times daily. (Patient not taking: Reported on 06/18/2020)    . metFORMIN (GLUCOPHAGE) 500 MG  tablet Take 1 tablet (500 mg total) by mouth in the morning and at bedtime. (Patient not taking: Reported on 06/18/2020) 30 tablet 1  . methocarbamol (ROBAXIN) 500 MG tablet Take 1 tablet (500 mg total) by mouth every 6 (six) hours as needed. (Patient not taking: Reported on 06/18/2020) 40 tablet 1  . metoCLOPramide (REGLAN) 5 MG tablet Take 1 tablet (5 mg total) by mouth 3 (three) times daily before meals. (Patient not taking: Reported on 06/18/2020) 90 tablet 0  . omeprazole (PRILOSEC) 40 MG capsule Take 1 capsule (40 mg total) by mouth daily. (Patient not taking: Reported on 06/18/2020) 30 capsule 3  . tiZANidine (ZANAFLEX) 4 MG tablet Take 1 tablet (4 mg total) by  mouth every 6 (six) hours as needed for muscle spasms. (Patient not taking: Reported on 06/18/2020) 30 tablet 0  . traMADol (ULTRAM) 50 MG tablet Take 1 tablet (50 mg total) by mouth every 6 (six) hours as needed. (Patient not taking: Reported on 06/18/2020) 15 tablet 0   No facility-administered medications prior to visit.    No Known Allergies  ROS Review of Systems A fourteen system review of systems was performed and found to be positive as per HPI.   Objective:    Physical Exam General:  Well Developed, well nourished, appropriate for stated age.  Neuro:  Alert and oriented,  extra-ocular muscles intact  HEENT:  Normocephalic, atraumatic, neck supple Skin:  no gross rash, warm, pink. Cardiac:  RRR, S1 S2 Respiratory:  ECTA B/L w/o wheezing, Not using accessory muscles, speaking in full sentences- unlabored. Abdomen: Hypoactive bowel sounds, TTP of epigastric area, negative Murphy's sign, mid-abdominal mass is noticeable with head raised Vascular:  Ext warm, no cyanosis apprec.; cap RF less 2 sec. Edema+ Psych:  No HI/SI, judgement and insight good, Euthymic mood. Full Affect.  BP (!) 154/76   Pulse 89   Temp (!) 97.4 F (36.3 C)   Ht _0  (1.803 m)   Wt 225 lb 9.6 oz (102.3 kg)   SpO2 100%   BMI 31.46 kg/m  Wt  Readings from Last 3 Encounters:  06/18/20 225 lb 9.6 oz (102.3 kg)  02/20/20 225 lb (102.1 kg)  01/24/20 228 lb 6.4 oz (103.6 kg)     Health Maintenance Due  Topic Date Due  . PNEUMOCOCCAL POLYSACCHARIDE VACCINE AGE 2-64 HIGH RISK  Never done  . OPHTHALMOLOGY EXAM  Never done  . HIV Screening  Never done  . TETANUS/TDAP  Never done  . FOOT EXAM  11/17/2018  . INFLUENZA VACCINE  12/02/2019  . COVID-19 Vaccine (3 - Booster for Moderna series) 02/27/2020    There are no preventive care reminders to display for this patient.  Lab Results  Component Value Date   TSH 0.731 11/30/2018   Lab Results  Component Value Date   WBC 7.2 05/11/2019   HGB 12.4 (L) 05/11/2019   HCT 35.5 (L) 05/11/2019   MCV 92.8 05/11/2019   PLT 92.0 (L) 05/11/2019   Lab Results  Component Value Date   NA 137 01/24/2020   K 3.8 01/24/2020   CO2 29 01/24/2020   GLUCOSE 197 (H) 01/24/2020   BUN 6 01/24/2020   CREATININE 1.12 01/24/2020   BILITOT 0.9 01/24/2020   ALKPHOS 192 (H) 01/24/2020   AST 35 01/24/2020   ALT 33 01/24/2020   PROT 7.1 01/24/2020   ALBUMIN 3.6 (L) 01/24/2020   CALCIUM 9.3 01/24/2020   GFR 102.34 05/11/2019   Lab Results  Component Value Date   CHOL 86 (L) 11/30/2018   Lab Results  Component Value Date   HDL 29 (L) 11/30/2018   Lab Results  Component Value Date   LDLCALC 47 11/30/2018   Lab Results  Component Value Date   TRIG 49 11/30/2018   Lab Results  Component Value Date   CHOLHDL 3.0 11/30/2018   Lab Results  Component Value Date   HGBA1C 7.9 (A) 06/18/2020      Assessment & Plan:   Problem List Items Addressed This Visit      Cardiovascular and Mediastinum   Hypertension associated with diabetes ([place]) (Chronic)    -BP elevated today. Recommend to start monitoring BP at home and keep a log to  bring at next OV, and should notify the clinic if BP consistently >135/85 for medication adjustments. Continue current medication regimen. -Continue  low-sodium diet and recommend to stay well-hydrated, at least 64 fluid ounces. -Will repeat CMP next OV.  Advised to schedule lab visit 1 week before appointment.      Relevant Medications   furosemide (LASIX) 20 MG tablet   losartan (COZAAR) 50 MG tablet     Endocrine   Diabetes mellitus ([name]) - Primary (Chronic)    -A1c today 7.9, has improved from 8.4.  Patient recently started Trulicity so we will continue with current medication regimen.  Will repeat A1c in 3 months and make medication adjustments if A1c fails to improve or worsen. -Recommend to start ambulatory glucose monitoring.  Dexcom sensor not covered by insurance so will provide prescription for glucometer kit with testing supplies. -Monitor/reduce carbohydrates and glucose, especially sweet tea.      Relevant Medications   losartan (COZAAR) 50 MG tablet   blood glucose meter kit and supplies   Other Relevant Orders   POCT glycosylated hemoglobin (Hb A1C) (Completed)   Diabetic peripheral neuropathy associated with type 2 diabetes mellitus ([place])    -Foot exam performed today, abnormal.  Patient recently treated by podiatry for foot ulcer which has healed.  Discussed with patient the importance of controlling diabetes to reduce further complications such as progressive neuropathy. -Continue current medication regimen.  Provided refill. -Will continue to monitor.      Relevant Medications   gabapentin (NEURONTIN) 600 MG tablet   losartan (COZAAR) 50 MG tablet   Mixed diabetic hyperlipidemia associated with type 2 diabetes mellitus ([name])    -Last direct LDL 46. -Continue current medication regimen.  -Advised patient to schedule lab visit 1 wk before next appointment to repeat lipid panel and hepatic function.       Relevant Medications   furosemide (LASIX) 20 MG tablet   losartan (COZAAR) 50 MG tablet     Other   Swelling of both lower extremities    -Stable. -Continue Lasix, low sodium diet and recommend  elevation and using compression socks. -Will repeat CMP for medication monitoring next OV.      Relevant Medications   furosemide (LASIX) 20 MG tablet    Other Visit Diagnoses    Gastroparesis       Relevant Medications   metoCLOPramide (REGLAN) 10 MG tablet     Gastroparesis:  -Dicussed with patient increasing metoclopramide to 10 mg 3 times daily and is agreeable to try. -Recommend to follow-up with gastroenterology. -Reviewed abdominal ultrasound 05/2019 which revealed hepatic cirrhosis without discrete focal mass and gallstones measuring up to 0.7 cm in diameter.  If symptoms fail to improve or worsen and patient will likely benefit from additional imaging such as CT A/P.  Discussed with patient abdominal mass is more suggestive of diastasis recti versus hernia.  Meds ordered this encounter  Medications  . gabapentin (NEURONTIN) 600 MG tablet    Sig: 2 tablets every morning, 2 tablets every afternoon, 3 tablets nightly.    Dispense:  630 tablet    Refill:  0  . furosemide (LASIX) 20 MG tablet    Sig: Take 1 tablet (20 mg total) by mouth daily as needed for edema.    Dispense:  90 tablet    Refill:  0  . losartan (COZAAR) 50 MG tablet    Sig: Take 1 tablet (50 mg total) by mouth daily.    Dispense:  90 tablet  Refill:  0  . metoCLOPramide (REGLAN) 10 MG tablet    Sig: Take 1 tablet (10 mg total) by mouth 3 (three) times daily before meals.    Dispense:  270 tablet    Refill:  0  . blood glucose meter kit and supplies    Sig: Dispense based on patient and insurance preference. Use up to four times daily as directed. (FOR ICD-10 E10.9, E11.9). Use to check fasting blood sugar and 2 hrs after largest meal.    Dispense:  1 each    Refill:  0    Order Specific Question:   Number of strips    Answer:   100    Order Specific Question:   Number of lancets    Answer:   100    Follow-up: Return in about 3 months (around 09/15/2020) for DM, HTN, HLD with FBW 1 wk before.     Lorrene Reid, PA-C

## 2020-06-18 NOTE — Assessment & Plan Note (Signed)
-  Stable. -Continue Lasix, low sodium diet and recommend elevation and using compression socks. -Will repeat CMP for medication monitoring next OV.

## 2020-06-18 NOTE — Assessment & Plan Note (Addendum)
-  BP elevated today. Recommend to start monitoring BP at home and keep a log to bring at next OV, and should notify the clinic if BP consistently >135/85 for medication adjustments. Continue current medication regimen. -Continue low-sodium diet and recommend to stay well-hydrated, at least 64 fluid ounces. -Will repeat CMP next OV.  Advised to schedule lab visit 1 week before appointment.

## 2020-06-18 NOTE — Patient Instructions (Signed)
Diabetic Neuropathy Diabetic neuropathy refers to nerve damage that is caused by diabetes. Over time, people with diabetes can develop nerve damage throughout the body. There are several types of diabetic neuropathy:  Peripheral neuropathy. This is the most common type of diabetic neuropathy. It damages the nerves that carry signals between the spinal cord and other parts of the body (peripheral nerves). This usually affects nerves in the feet, legs, hands, and arms.  Autonomic neuropathy. This type causes damage to nerves that control involuntary functions (autonomic nerves). Involuntary functions are functions of the body that you do not control. They include heartbeat, body temperature, blood pressure, urination, digestion, sweating, sexual function, or response to changes in blood glucose.  Focal neuropathy. This type of nerve damage affects one area of the body, such as an arm, a leg, or the face. The injury may involve one nerve or a small group of nerves. Focal neuropathy can be painful and unpredictable. It occurs most often in older adults with diabetes. This often develops suddenly, but usually improves over time and does not cause long-term problems.  Proximal neuropathy. This type of nerve damage affects the nerves of the thighs, hips, buttocks, or legs. It causes severe pain, weakness, and muscle death (atrophy), usually in the thigh muscles. It is more common among older men and people who have type 2 diabetes. The length of recovery time may vary. What are the causes? Peripheral, autonomic, and focal neuropathies are caused by diabetes that is not well controlled with treatment. The cause of proximal neuropathy is not known, but it may be caused by inflammation related to uncontrolled blood glucose levels. What are the signs or symptoms? Peripheral neuropathy Peripheral neuropathy develops slowly over time. When the nerves of the feet and legs no longer work, you may  experience:  Burning, stabbing, or aching pain in the legs or feet.  Pain or cramping in the legs or feet.  Loss of feeling (numbness) and inability to feel pressure or pain in the feet. This can lead to: ? Thick calluses or sores on areas of constant pressure. ? Ulcers. ? Reduced ability to feel temperature changes.  Foot deformities.  Muscle weakness.  Loss of balance or coordination. Autonomic neuropathy The symptoms of autonomic neuropathy vary depending on which nerves are affected. Symptoms may include:  Problems with digestion, such as: ? Nausea or vomiting. ? Poor appetite. ? Bloating. ? Diarrhea or constipation. ? Trouble swallowing. ? Losing weight without trying to.  Problems with the heart, blood, and lungs, such as: ? Dizziness, especially when standing up. ? Fainting. ? Shortness of breath. ? Irregular heartbeat.  Bladder problems, such as: ? Trouble starting or stopping urination. ? Leaking urine. ? Trouble emptying the bladder. ? Urinary tract infections (UTIs).  Problems with other body functions, such as: ? Sweat. You may sweat too much or too little. ? Temperature. You might get hot easily. Or, you might feel cold more than usual. ? Sexual function. Men may not be able to get or maintain an erection. Women may have vaginal dryness and difficulty with arousal. Focal neuropathy Symptoms affect only one area of the body. Common symptoms include:  Numbness.  Tingling.  Burning pain.  Prickling feeling.  Very sensitive skin.  Weakness.  Inability to move (paralysis).  Muscle twitching.  Muscles getting smaller (wasting).  Poor coordination.  Double or blurred vision. Proximal neuropathy  Sudden, severe pain in the hip, thigh, or buttocks. Pain may spread from the back into the legs (  sciatica).  Pain and numbness in the arms and legs.  Tingling.  Loss of bladder control or bowel control.  Weakness and wasting of thigh  muscles.  Difficulty getting up from a seated position.  Abdominal swelling.  Unexplained weight loss. How is this diagnosed? Diagnosis varies depending on the type of neuropathy your health care provider suspects. Peripheral neuropathy Your health care provider will do a neurologic exam. This exam checks your reflexes, how you move, and what you can feel. You may have other tests, such as:  Blood tests.  Tests of the fluid that surrounds the spinal cord (lumbar puncture).  CT scan.  MRI.  Checking the nerves that control muscles (electromyogram, or EMG).  Checking how quickly signals pass through your nerves (nerve conduction study).  Checking a small piece of a nerve using a microscope (biopsy). Autonomic neuropathy You may have tests, such as:  Tests to measure your blood pressure and heart rate. You may be secured to an exam table that moves you from a lying position to an upright position (table tilt test).  Breathing tests to check your lungs.  Tests to check how food moves through the digestive system (gastric emptying tests).  Blood, sweat, or urine tests.  Ultrasound of your bladder.  Spinal fluid tests. Focal neuropathy This condition may be diagnosed with:  A neurologic exam.  CT scan.  MRI.  EMG.  Nerve conduction study. Proximal neuropathy There is no test to diagnose this type of neuropathy. You may have tests to rule out other possible causes of this type of neuropathy. Tests may include:  X-rays of your spine and lumbar region.  Lumbar puncture.  MRI. How is this treated? The goal of treatment is to keep nerve damage from getting worse. Treatment may include:  Following your diabetes management plan. This will help keep your blood glucose level and your A1C level within your target range. This is the most important treatment.  Using prescription pain medicine. Follow these instructions at home: Diabetes management Follow your diabetes  management plan as told by your health care provider.  Check your blood glucose levels.  Keep your blood glucose in your target range.  Have your A1C level checked at least two times a year, or as often as told.  Take over the counter and prescription medicines only as told by your health care provider. This includes insulin and diabetes medicine.   Lifestyle  Do not use any products that contain nicotine or tobacco, such as cigarettes, e-cigarettes, and chewing tobacco. If you need help quitting, ask your health care provider.  Be physically active every day. Include strength training and balance exercises.  Follow a healthy meal plan.  Work with your health care provider to manage your blood pressure.   General instructions  Ask your health care provider if the medicine prescribed to you requires you to avoid driving or using machinery.  Check your skin and feet every day for cuts, bruises, redness, blisters, or sores.  Keep all follow-up visits. This is important. Contact a health care provider if:  You have burning, stabbing, or aching pain in your legs or feet.  You are unable to feel pressure or pain in your feet.  You develop problems with digestion, such as: ? Nausea. ? Vomiting. ? Bloating. ? Constipation. ? Diarrhea. ? Abdominal pain.  You have difficulty with urination, such as: ? Inability to control when you urinate (incontinence). ? Inability to completely empty the bladder (retention).  You feel   as if your heart is racing (palpitations).  You feel dizzy, weak, or faint when you stand up. Get help right away if:  You cannot urinate.  You have sudden weakness or loss of coordination.  You have trouble speaking.  You have pain or pressure in your chest.  You have an irregular heartbeat.  You have sudden inability to move a part of your body. These symptoms may represent a serious problem that is an emergency. Do not wait to see if the symptoms  will go away. Get medical help right away. Call your local emergency services (911 in the [place] Do not drive yourself to the hospital. Summary  Diabetic neuropathy is nerve damage that is caused by diabetes. It can cause numbness and pain in the arms, legs, digestive tract, heart, and other body systems.  This condition is treated by keeping your blood glucose level and your A1C level within your target range. This can help prevent neuropathy from getting worse.  Check your skin and feet every day for cuts, bruises, redness, blisters, or sores.  Do not use any products that contain nicotine or tobacco, such as cigarettes, e-cigarettes, and chewing tobacco. This information is not intended to replace advice given to you by your health care provider. Make sure you discuss any questions you have with your health care provider. Document Revised: 08/30/2019 Document Reviewed: 08/30/2019 Elsevier Patient Education  2021 [employer]  

## 2020-06-18 NOTE — Assessment & Plan Note (Signed)
-  Foot exam performed today, abnormal.  Patient recently treated by podiatry for foot ulcer which has healed.  Discussed with patient the importance of controlling diabetes to reduce further complications such as progressive neuropathy. -Continue current medication regimen.  Provided refill. -Will continue to monitor.

## 2020-07-07 ENCOUNTER — Ambulatory Visit: Payer: BC Managed Care – PPO | Admitting: Physician Assistant

## 2020-07-11 ENCOUNTER — Other Ambulatory Visit: Payer: Self-pay

## 2020-07-11 ENCOUNTER — Ambulatory Visit (INDEPENDENT_AMBULATORY_CARE_PROVIDER_SITE_OTHER): Payer: BC Managed Care – PPO

## 2020-07-11 ENCOUNTER — Ambulatory Visit: Payer: BC Managed Care – PPO | Admitting: Podiatry

## 2020-07-11 ENCOUNTER — Other Ambulatory Visit: Payer: Self-pay | Admitting: Podiatry

## 2020-07-11 ENCOUNTER — Ambulatory Visit (HOSPITAL_COMMUNITY)
Admission: RE | Admit: 2020-07-11 | Discharge: 2020-07-11 | Disposition: A | Discharge status: Home / Self Care | Payer: BC Managed Care – PPO | Source: Ambulatory Visit | Attending: Podiatry | Admitting: Podiatry

## 2020-07-11 ENCOUNTER — Telehealth: Payer: Self-pay | Admitting: Podiatry

## 2020-07-11 DIAGNOSIS — E1169 Type 2 diabetes mellitus with other specified complication: Secondary | ICD-10-CM

## 2020-07-11 DIAGNOSIS — I739 Peripheral vascular disease, unspecified: Secondary | ICD-10-CM

## 2020-07-11 DIAGNOSIS — I999 Unspecified disorder of circulatory system: Secondary | ICD-10-CM | POA: Diagnosis not present

## 2020-07-11 DIAGNOSIS — L97522 Non-pressure chronic ulcer of other part of left foot with fat layer exposed: Secondary | ICD-10-CM

## 2020-07-11 DIAGNOSIS — M86172 Other acute osteomyelitis, left ankle and foot: Secondary | ICD-10-CM

## 2020-07-11 DIAGNOSIS — L97512 Non-pressure chronic ulcer of other part of right foot with fat layer exposed: Secondary | ICD-10-CM | POA: Diagnosis not present

## 2020-07-11 DIAGNOSIS — Z01818 Encounter for other preprocedural examination: Secondary | ICD-10-CM

## 2020-07-11 MED ORDER — DOXYCYCLINE HYCLATE 100 MG PO TABS: 100.0000 mg | ORAL_TABLET | Freq: Two times a day (BID) | ORAL | 0 refills | Status: DC

## 2020-07-11 NOTE — Telephone Encounter (Signed)
[facility] called to report ABI Results   .95 on R 1.27 on L  .53 R great toe .32 L great toe

## 2020-07-14 ENCOUNTER — Other Ambulatory Visit: Payer: Self-pay | Admitting: Podiatry

## 2020-07-14 ENCOUNTER — Telehealth: Payer: Self-pay | Admitting: Podiatry

## 2020-07-14 ENCOUNTER — Telehealth: Payer: Self-pay | Admitting: *Deleted

## 2020-07-14 DIAGNOSIS — M86672 Other chronic osteomyelitis, left ankle and foot: Secondary | ICD-10-CM

## 2020-07-14 DIAGNOSIS — R6889 Other general symptoms and signs: Secondary | ICD-10-CM

## 2020-07-14 MED ORDER — IBUPROFEN 800 MG PO TABS: 800.0000 mg | ORAL_TABLET | Freq: Four times a day (QID) | ORAL | 1 refills | Status: DC | PRN

## 2020-07-14 MED ORDER — OXYCODONE-ACETAMINOPHEN 5-325 MG PO TABS: 1.0000 | ORAL_TABLET | ORAL | 0 refills | Status: DC | PRN

## 2020-07-14 MED ORDER — ONDANSETRON HCL 4 MG PO TABS: 4.0000 mg | ORAL_TABLET | Freq: Three times a day (TID) | ORAL | 0 refills | Status: DC | PRN

## 2020-07-14 NOTE — Telephone Encounter (Signed)
Gildardo Cranker w/ [facility] is calling to verify directions on a prescription ,Percocet-5/325 mg tablets, 1-2 tablets every 4 hours,prn which is (90 morphine equivalents). She explained that the CDC recommends no more that 50 morphine equivalent for first time users of opioids. She would like  to lower the dosage to 1 tablet every 4 hours which would be 50 morphine equivalent. Please advise.

## 2020-07-14 NOTE — Telephone Encounter (Signed)
That is fine she can lower it

## 2020-07-14 NOTE — Telephone Encounter (Signed)
Called,spoke with [name] [facility]) and informed per Dr Posey Pronto that it is ok to lower the dosage of Percocet 5/325 mg. She verbalized understanding.

## 2020-07-15 ENCOUNTER — Encounter: Payer: Self-pay | Admitting: Podiatry

## 2020-07-15 NOTE — Progress Notes (Signed)
Subjective:  Patient ID: [name], male    DOB: [birth year],  MRN: [id number]  Chief Complaint  Patient presents with  . Foot Ulcer    Left foot 2nd toe     55 y.o. male presents with the above complaint.  Patient presents with concern for left second digit ulceration with no concern for infection.  Patient states been like this for a week there is some is some drainage associated with it.  Patient worried he may lose his toe.  She states that it started to become a little bit darker as well.  He denies any other acute complaints.  He has not done anything for the toe.  He has not been putting dressing changes.  His right side is doing well.   Review of Systems: Negative except as noted in the HPI. Denies N/V/F/Ch.  Past Medical History:  Diagnosis Date  . Cirrhosis ([place])   . Colon polyps   . Diabetes mellitus without complication ([place])   . Esophageal candidiasis ([place])   . GERD (gastroesophageal reflux disease)    as a child, some as an adult  . Hypertension   . Sleep apnea    does not use Cpap  . Thrombocytopenia ([place])   . Wears glasses     Current Outpatient Medications:  .  doxycycline (VIBRA-TABS) 100 MG tablet, Take 1 tablet (100 mg total) by mouth 2 (two) times daily., Disp: 20 tablet, Rfl: 0 .  atorvastatin (LIPITOR) 20 MG tablet, Take 1 tablet (20 mg total) by mouth at bedtime., Disp: 90 tablet, Rfl: 0 .  blood glucose meter kit and supplies, Dispense based on patient and insurance preference. Use up to four times daily as directed. (FOR ICD-10 E10.9, E11.9). Use to check fasting blood sugar and 2 hrs after largest meal., Disp: 1 each, Rfl: 0 .  Dulaglutide (TRULICITY) 3.23 FT/7.3UK SOPN, Inject 0.75 mg into the skin once a week., Disp: 2 mL, Rfl: 3 .  furosemide (LASIX) 20 MG tablet, Take 1 tablet (20 mg total) by mouth daily as needed for edema., Disp: 90 tablet, Rfl: 0 .  gabapentin (NEURONTIN) 600 MG tablet, 2 tablets every morning, 2 tablets every afternoon, 3  tablets nightly., Disp: 630 tablet, Rfl: 0 .  ibuprofen (ADVIL) 800 MG tablet, Take 1 tablet (800 mg total) by mouth 3 (three) times daily., Disp: 21 tablet, Rfl: 0 .  ibuprofen (ADVIL) 800 MG tablet, Take 1 tablet (800 mg total) by mouth every 6 (six) hours as needed., Disp: 60 tablet, Rfl: 1 .  losartan (COZAAR) 50 MG tablet, Take 1 tablet (50 mg total) by mouth daily., Disp: 90 tablet, Rfl: 0 .  metoCLOPramide (REGLAN) 10 MG tablet, Take 1 tablet (10 mg total) by mouth 3 (three) times daily before meals., Disp: 270 tablet, Rfl: 0 .  ondansetron (ZOFRAN) 4 MG tablet, Take 1 tablet (4 mg total) by mouth every 8 (eight) hours as needed for nausea or vomiting., Disp: 20 tablet, Rfl: 0 .  oxyCODONE-acetaminophen (PERCOCET) 5-325 MG tablet, Take 1-2 tablets by mouth every 4 (four) hours as needed for severe pain., Disp: 30 tablet, Rfl: 0 .  polyethylene glycol (MIRALAX) 17 g packet, Take 17 g by mouth daily as needed for moderate constipation. Increase as needed, Disp: , Rfl:  .  Vitamin D, Ergocalciferol, (DRISDOL) 1.25 MG (50000 UNIT) CAPS capsule, Take 1 capsule (50,000 Units total) by mouth 2 (two) times a week. Wednesdays & Sundays., Disp: 30 capsule, Rfl: 1  Social History  Tobacco Use  Smoking Status Former Smoker  . Types: Cigarettes  . Quit date: 06/23/1995  . Years since quitting: 25.0  Smokeless Tobacco Current User  . Types: Chew    No Known Allergies Objective:  There were no vitals filed for this visit. There is no height or weight on file to calculate BMI. Constitutional Well developed. Well nourished.  Vascular Dorsalis pedis pulses nonpalpable bilaterally. Posterior tibial pulses non palpable bilaterally. Capillary refill normal to all digits.  No cyanosis or clubbing noted. Pedal hair growth normal.  Neurologic Normal speech. Oriented to person, place, and time. Epicritic sensation to light touch grossly present bilaterally.  Dermatologic Nails well groomed and  normal in appearance. No open wounds. No skin lesions.  Orthopedic:  No purulent drainage noted.  The wound does probe down to bone.  Concern for bone infection.  There is malodor present to it.  Dark discoloration present to the digit as well.  Discoloration/gangrene of the second digit is up to the level of the PIPJ joint.   Radiographs: 3 views of skeletally mature adult left foot: There is cortical irregularity noted to the distal phalanx of the second toe.  Consistent with osteomyelitis. Assessment:   1. Acute osteomyelitis of toe, left ([name])   2. Vascular abnormality   3. Type 2 diabetes mellitus with other specified complication, without long-term current use of insulin ([place])   4. Peripheral vascular disease ([place])   5. Preoperative examination    Plan:  Patient was evaluated and treated and all questions answered.  Left second digit osteomyelitis with concern for loss of limb preoperative examination -I explained to patient the etiology of osteomyelitis and various treatment options were extensively discussed.  Clinically patient has all the signs of bone infection as it currently probes down to bone with malodor present.  There is beginning signs of gangrene is associated with it as well.  In the setting of nonpalpable pulses I believe patient will benefit from stat ABIs PVRs.  He will follow with vascular surgery next week after my amputation.  I will plan on proceeding with the amputation of the left second digit partial to control infection.  Patient agrees with the plan.  He would like to proceed with infection control with amputation first and then vascular intervention. -He will be weightbearing as tolerated in surgical shoe after the surgery. -ABIs PVRs were ordered stat -Doxycycline was dispensed -Surgical shoe was dispensed -Informed surgical risk consent was reviewed and read aloud to the patient.  I reviewed the films.  I have discussed my findings with the patient in great  detail.  I have discussed all risks including but not limited to infection, stiffness, scarring, limp, disability, deformity, damage to blood vessels and nerves, numbness, poor healing, need for braces, arthritis, chronic pain, amputation, death.  All benefits and realistic expectations discussed in great detail.  I have made no promises as to the outcome.  I have provided realistic expectations.  I have offered the patient a 2nd opinion, which they have declined and assured me they preferred to proceed despite the risks -A total of 33 minutes was spent in direct patient care as well as pre and post patient encounter activities.  This includes documentation as well as reviewing patient chart for labs, imaging, past medical, surgical, social, and family history as documented in the EMR.  I have reviewed medication allergies as documented in EMR.  I discussed the etiology of condition and treatment options from conservative to surgical care.  All risks and benefit of the treatment course was discussed in detail.  All questions were answered and return appointment was discussed.  Since the visit completed in an ambulatory/outpatient setting, the patient and/or parent/guardian has been advised to contact the providers office for worsening condition and seek medical treatment and/or call 911 if the patient deems either is necessary.   No follow-ups on file.

## 2020-07-16 ENCOUNTER — Other Ambulatory Visit: Payer: Self-pay

## 2020-07-16 ENCOUNTER — Ambulatory Visit
Admission: RE | Admit: 2020-07-16 | Discharge: 2020-07-16 | Disposition: A | Discharge status: Home / Self Care | Payer: BC Managed Care – PPO | Source: Ambulatory Visit | Attending: Podiatry | Admitting: Podiatry

## 2020-07-16 DIAGNOSIS — R6889 Other general symptoms and signs: Secondary | ICD-10-CM

## 2020-07-17 ENCOUNTER — Other Ambulatory Visit: Payer: Self-pay

## 2020-07-17 ENCOUNTER — Telehealth: Payer: Self-pay | Admitting: Podiatry

## 2020-07-17 ENCOUNTER — Ambulatory Visit
Admission: RE | Admit: 2020-07-17 | Discharge: 2020-07-17 | Disposition: A | Discharge status: Home / Self Care | Payer: BC Managed Care – PPO | Source: Ambulatory Visit | Attending: Podiatry | Admitting: Podiatry

## 2020-07-17 ENCOUNTER — Encounter: Payer: Self-pay | Admitting: *Deleted

## 2020-07-17 HISTORY — PX: IR RADIOLOGIST EVAL & MGMT: IMG5224

## 2020-07-17 NOTE — Telephone Encounter (Signed)
Patient had sx this week with Dr. Posey Pronto and the sx site has some oozing and. Pt wife would like to know what to do? Dressing also came off

## 2020-07-17 NOTE — Consult Note (Signed)
Chief Complaint: Left second toe gangrene   Referring Physician(s): Patel,Kevin P  History of Present Illness: [name] is a 55 y.o. male presenting as a scheduled consultation to [place] [facility], kindly referred by Dr. Posey Pronto of [facility], for evaluation of his lower extremity atherosclerosis, left second toe wound, and possible candidacy for revascularization.   Mr Heathman joins [place] today by virtual visit, given the COVID situation.  We confirmed his identity with 2 personal identifiers.   He tells me that his left second toe has been "[place]" for a few months now, but recently started to deteriorate and ulcerate when he "pulled off the nail."  He recently underwent amputation with Dr. Posey Pronto for the gangrene and evidence of osteomyelitis.    I cannot elicit any history of resting pain of the left or right lower extremity.  He tells me that he has bilateral "leg tiredness" especially when he ambulated short distances.  He states that the symptoms are fairly symmetric.    He tells me this was the first wound of the left foot, but he has had an ulcer/wound of the "bottom of the right foot" about a year ago in early 2021.  This healed eventually, but took a few months.  He did not receive any advanced care for this right foot wound.    He denies any history of MI or stroke.  He denies resting chest pain.    CV risk factors: DM, HTN, HLD, smoking history  ABI/non-invasive: 07/11/2020 Right ABI: 0.95.  Segmental doppler shows monophasic PT, biphasic AT Left ABI: 1.27.  Segmental doppler shows monophasic PT & AT. Digital pressure is 66mHg, with index of 0.32  He is married, and has 1 son.    He remains active, performing all of his ADL's.  No longer working.   Past Medical History:  Diagnosis Date  . Cirrhosis ([facility]   . Colon polyps   . Diabetes mellitus without complication ([facility]   . Esophageal candidiasis ([facility]   . GERD (gastroesophageal reflux disease)    as a child,  some as an adult  . Hypertension   . Sleep apnea    does not use Cpap  . Thrombocytopenia ([facility]   . Wears glasses     Past Surgical History:  Procedure Laterality Date  . APPENDECTOMY    . BACK SURGERY    . COLONOSCOPY    . COLONOSCOPY WITH PROPOFOL N/A 07/02/2019   Procedure: COLONOSCOPY WITH PROPOFOL;  Surgeon: MRush LandmarkGTelford Nab, MD;  [facility]  Service: Gastroenterology;  Laterality: N/A;  . COLONOSCOPY WITH PROPOFOL N/A 09/17/2019   Procedure: COLONOSCOPY WITH PROPOFOL;  Surgeon: MRush LandmarkGTelford Nab, MD;  [facility]  Service: Gastroenterology;  Laterality: N/A;  . ENDOSCOPIC MUCOSAL RESECTION N/A 09/17/2019   Procedure: ENDOSCOPIC MUCOSAL RESECTION;  Surgeon: MRush LandmarkGTelford Nab, MD;  [place] [facility]  Service: Gastroenterology;  Laterality: N/A;  . HEMOSTASIS CLIP PLACEMENT  09/17/2019   Procedure: HEMOSTASIS CLIP PLACEMENT;  Surgeon: MIrving Copas, MD;  [facility]  Service: Gastroenterology;;  . SLia FoyerLIFTING INJECTION  09/17/2019   Procedure: SUBMUCOSAL LIFTING INJECTION;  Surgeon: MIrving Copas, MD;  [facility]  Service: Gastroenterology;;    Allergies: Patient has no known allergies.  Medications: Prior to Admission medications   Medication Sig Start Date End Date Taking? Authorizing Provider  atorvastatin (LIPITOR) 20 MG tablet Take 1 tablet (20 mg total) by mouth at bedtime. 01/16/20   BLoura Halt  A, NP  blood glucose meter kit and supplies Dispense based on patient and insurance preference. Use up to four times daily as directed. (FOR ICD-10 E10.9, E11.9). Use to check fasting blood sugar and 2 hrs after largest meal. 06/18/20   Abonza, Maritza, PA-C  doxycycline (VIBRA-TABS) 100 MG tablet Take 1 tablet (100 mg total) by mouth 2 (two) times daily. 07/11/20   Felipa Furnace, DPM  Dulaglutide (TRULICITY) 7.67 HA/1.9FX SOPN Inject 0.75 mg into the skin once a week. 01/30/20   Lorrene Reid, PA-C  furosemide (LASIX) 20 MG tablet Take 1 tablet (20 mg total) by mouth daily as needed for edema. 06/18/20   Lorrene Reid, PA-C  gabapentin (NEURONTIN) 600 MG tablet 2 tablets every morning, 2 tablets every afternoon, 3 tablets nightly. 06/18/20   Lorrene Reid, PA-C  ibuprofen (ADVIL) 800 MG tablet Take 1 tablet (800 mg total) by mouth 3 (three) times daily. 01/16/20   Loura Halt A, NP  ibuprofen (ADVIL) 800 MG tablet Take 1 tablet (800 mg total) by mouth every 6 (six) hours as needed. 07/14/20   Felipa Furnace, DPM  losartan (COZAAR) 50 MG tablet Take 1 tablet (50 mg total) by mouth daily. 06/18/20   Lorrene Reid, PA-C  metoCLOPramide (REGLAN) 10 MG tablet Take 1 tablet (10 mg total) by mouth 3 (three) times daily before meals. 06/18/20   Lorrene Reid, PA-C  ondansetron (ZOFRAN) 4 MG tablet Take 1 tablet (4 mg total) by mouth every 8 (eight) hours as needed for nausea or vomiting. 07/14/20   Felipa Furnace, DPM  oxyCODONE-acetaminophen (PERCOCET) 5-325 MG tablet Take 1-2 tablets by mouth every 4 (four) hours as needed for severe pain. 07/14/20   Felipa Furnace, DPM  polyethylene glycol (MIRALAX) 17 g packet Take 17 g by mouth daily as needed for moderate constipation. Increase as needed 01/16/20   Loura Halt A, NP  Vitamin D, Ergocalciferol, (DRISDOL) 1.25 MG (50000 UNIT) CAPS capsule Take 1 capsule (50,000 Units total) by mouth 2 (two) times a week. Wednesdays & Sundays. 01/17/20   Orvan July, NP     Family History  Problem Relation Age of Onset  . Brain cancer Mother   . Diabetes Father   . Breast cancer Sister   . Colon cancer Neg Hx   . Liver cancer Neg Hx   . Esophageal cancer Neg Hx   . Inflammatory bowel disease Neg Hx   . Liver disease Neg Hx   . Pancreatic cancer Neg Hx   . Rectal cancer Neg Hx   . Stomach cancer Neg Hx   . Colon polyps Neg Hx     Social History   Socioeconomic History  . Marital status: Married    Spouse name: Not on file  . Number of  children: 1  . Years of education: Not on file  . Highest education level: Not on file  Occupational History  . Occupation: unemployed  Tobacco Use  . Smoking status: Former Smoker    Types: Cigarettes    Quit date: 06/23/1995    Years since quitting: 25.0  . Smokeless tobacco: Current User    Types: Chew  Vaping Use  . Vaping Use: Never used  Substance and Sexual Activity  . Alcohol use: Yes    Comment: 3 drinks a month  . Drug use: Yes    Types: Marijuana  . Sexual activity: Yes    Birth control/protection: None  Other Topics Concern  . Not on file  Social History Narrative  . Not on file   Social Determinants of Health   Financial Resource Strain: Not on file  Food Insecurity: Not on file  Transportation Needs: Not on file  Physical Activity: Not on file  Stress: Not on file  Social Connections: Not on file      Review of Systems  Review of Systems: A 12 point ROS discussed and pertinent positives are indicated in the HPI above.  All other systems are negative.  Physical Exam No direct physical exam was performed (except for noted visual exam findings with Video Visits).    Vital Signs: There were no vitals taken for this visit.  Imaging: DG Foot Complete Left  Result Date: 07/11/2020 Please see detailed radiograph report in office note.  VAS [place] ABI WITH/WO TBI  Result Date: 07/11/2020 LOWER EXTREMITY DOPPLER STUDY Indications: Claudication, and ulceration. second left toe High Risk Factors: Hypertension, Diabetes, past history of smoking.  Performing Technologist: Alvia Grove RVT  Examination Guidelines: A complete evaluation includes at minimum, Doppler waveform signals and systolic blood pressure reading at the level of bilateral brachial, anterior tibial, and posterior tibial arteries, when vessel segments are accessible. Bilateral testing is considered an integral part of a complete examination. Photoelectric Plethysmograph (PPG) waveforms and toe systolic  pressure readings are included as required and additional duplex testing as needed. Limited examinations for reoccurring indications may be performed as noted.  ABI Findings: +---------+------------------+-----+----------+--------+ Right    Rt Pressure (mmHg)IndexWaveform  Comment  +---------+------------------+-----+----------+--------+ Brachial 142                                       +---------+------------------+-----+----------+--------+ PTA      135               0.95 monophasic         +---------+------------------+-----+----------+--------+ DP       133               0.94 biphasic           +---------+------------------+-----+----------+--------+ Great Toe75                0.53 Abnormal           +---------+------------------+-----+----------+--------+ +---------+------------------+-----+----------+-------+ Left     Lt Pressure (mmHg)IndexWaveform  Comment +---------+------------------+-----+----------+-------+ Brachial 137                                      +---------+------------------+-----+----------+-------+ PTA      181               1.27 monophasic        +---------+------------------+-----+----------+-------+ DP       122               0.86 monophasic        +---------+------------------+-----+----------+-------+ Great Toe45                0.32 Abnormal          +---------+------------------+-----+----------+-------+   Findings reported to Message left with triage at 3:43. Summary: Right: Resting right ankle-brachial index indicates mild right lower extremity arterial disease using the DPA . The right toe-brachial index is abnormal. Left: The left toe-brachial index is abnormal. ABIs are unreliable.  *See table(s) above for measurements and observations.  Electronically signed by Servando Snare MD on  07/11/2020 at 4:41:50 PM.    Final     Labs:  CBC: No results for input(s): WBC, HGB, HCT, PLT in the last 8760 hours.  COAGS: No  results for input(s): INR, APTT in the last 8760 hours.  BMP: Recent Labs    01/24/20 1401  NA 137  K 3.8  CL 100  CO2 29  GLUCOSE 197*  BUN 6  CALCIUM 9.3  CREATININE 1.12  GFRNONAA 76  GFRAA 87    LIVER FUNCTION TESTS: Recent Labs    01/24/20 1401  BILITOT 0.9  AST 35  ALT 33  ALKPHOS 192*  PROT 7.1  ALBUMIN 3.6*    TUMOR MARKERS: No results for input(s): AFPTM, CEA, CA199, CHROMGRNA in the last 8760 hours.  Assessment and Plan:  Assessment:  Mr Sires is a 56 yo AA male, presenting with Ruther[place] 5 class symptoms of CLI, left lower extremity, second toe.   Non-invasive lower extremity exam shows monophasic waveforms of the left tibial arteries, I suspect with typical distribution of tibial disease, given his DM and presentation.    The WIfI matrix calculation, using a surrogate for the ABI of severe range of ABI, places Mr Londo in the "High risk", "high benefit" category for revascularization.     I had a lengthy discussion with Mr [facility] regarding anatomy, pathology/pathophysiology, natural history, and prognosis of PAD/CLI. This included the information regarding the "pathway to amputation", and the ~50% risk of major amputation within 2 years of the ipsilateral limb, given his DM and history, if left untreated.   Informed consent regarding treatment strategies was performed which would possibly include medical management, vs endovascular options, with risk/benefit discussion.  The indications for treatment supported by updated guidelines1, 2 were discussed.  Patient has elected to proceed with endovascular options.   Regarding endovascular options, specific risks discussed include: bleeding, infection, contrast reaction, renal injury/nephropathy, arterial injury/dissection, need for additional procedure/surgery, worsening symptoms/tissue including limb loss, cardiopulmonary collapse, death.    Regarding medical management, maximal medical therapy for reduction of  risk factors is indicated as recommended by updated AHA guidelines1.  This includes anti-platelet medication, tight blood glucose control to a HbA1c < 7, tight blood pressure control, maximum-dose HMG-CoA reductase inhibitor, and smoking cessation.  He has already quit smoking, as of the late 90's by his report.   Annual flu vaccination is also recommended, with Class 1 recommendation1.   Finally, he wanted to know about treating his right leg as well, knowing that the flow is abnormal.  I did emphasize that the left is of concern currently, and we will follow up in clinic to discuss further management strategies.   Plan: -Plan is to proceed with aorto-peripheral angiogram and possible intervention, with Dr. Earleen Newport at [facility], [place].    - Plan for dual antiplatelet therapy after his treatment, with ASA and plavix.  This was discussed.  -Recommend maximal medical therapy for cardiovascular risk reduction, including anti-platelet therapy.   ___________________________________________________________________   1Morley Kos MD, et al. 2016 AHA/ACC Guideline on the Management of Patients With Lower Extremity Peripheral Artery Disease: Executive Summary: A Report of the American College of Cardiology/American Heart Association Task Force on Clinical Practice Guidelines. J Am Coll Cardiol. 2017 Mar 21;69(11):1465-1508. doi: 10.1016/j.jacc.2016.11.008.   2 - Norgren L, et al. TASC II Working Group. Inter-society consensus for the management of peripheral arterial disease. Int Tressia Miners. 2007 Jun;26(2):81-157. Review. PubMed PMID: 81275170  3 - Hingorani A, et al. The management of diabetic foot: A clinical  practice guideline by the Society for Vascular Surgery in collaboration with the [place] and the Society  for Vascular Medicine. J Vasc Surg. 2016 Feb;63(2 Suppl):3S-21S. doi: 10.1016/j.jvs.2015.10.003. PubMed PMID: 02542706.  4 - Corinna Gab, Saab FA, Luberta Mutter, Grant Ruts, Ewell Poe, Driver VR, Manning, Lookstein R, van den Baldemar Lenis, Jaff MR, Guadalupe Dawn, Henao S, AlMahameed A, Katzen B. Digital Subtraction Angiography Prior to an Amputation for Critical Limb Ischemia (CLI): An Expert Recommendation Statement From the CLI Global Society to Optimize Limb Salvage. J Endovasc Ther. 2020 Aug;27(4):540-546. doi: 10.1177/1526602820928590. Epub 2020 May 29. PMID: 23762831.    Thank you for this interesting consult.  I greatly enjoyed meeting BRENNON OTTERNESS and look forward to participating in their care.  A copy of this report was sent to the requesting provider on this date.  Electronically Signed: Corrie Mckusick 07/17/2020, 10:51 AM   I spent a total of  60 Minutes   in remote  clinical consultation, greater than 50% of which was counseling/coordinating care for left leg critical limb ischemia, possible intervention.    Visit type: Audio only (telephone). Audio (no video) only due to patient's lack of internet/smartphone capability. Alternative for in-person consultation at [facility], [address], [place], [place]. This visit type was conducted due to national recommendations for restrictions regarding the COVID-19 Pandemic (e.g. social distancing).  This format is felt to be most appropriate for this patient at this time.  All issues noted in this document were discussed and addressed.

## 2020-07-17 NOTE — Telephone Encounter (Signed)
Do betadine wet to dry dressings

## 2020-07-22 ENCOUNTER — Other Ambulatory Visit (HOSPITAL_COMMUNITY): Payer: Self-pay | Admitting: Interventional Radiology

## 2020-07-22 ENCOUNTER — Telehealth (HOSPITAL_COMMUNITY): Payer: Self-pay | Admitting: Radiology

## 2020-07-22 DIAGNOSIS — I739 Peripheral vascular disease, unspecified: Secondary | ICD-10-CM

## 2020-07-22 NOTE — Telephone Encounter (Signed)
Called pt, left VM for him to call to set up his leg treatment with Dr. Earleen Newport for 07/24/20. JM

## 2020-07-23 ENCOUNTER — Ambulatory Visit (INDEPENDENT_AMBULATORY_CARE_PROVIDER_SITE_OTHER): Payer: BC Managed Care – PPO

## 2020-07-23 ENCOUNTER — Other Ambulatory Visit: Payer: Self-pay | Admitting: Radiology

## 2020-07-23 ENCOUNTER — Other Ambulatory Visit: Payer: Self-pay

## 2020-07-23 ENCOUNTER — Ambulatory Visit (INDEPENDENT_AMBULATORY_CARE_PROVIDER_SITE_OTHER): Payer: BC Managed Care – PPO | Admitting: Podiatry

## 2020-07-23 DIAGNOSIS — Z89422 Acquired absence of other left toe(s): Secondary | ICD-10-CM

## 2020-07-23 DIAGNOSIS — Z9889 Other specified postprocedural states: Secondary | ICD-10-CM

## 2020-07-23 DIAGNOSIS — M86172 Other acute osteomyelitis, left ankle and foot: Secondary | ICD-10-CM

## 2020-07-23 DIAGNOSIS — E1169 Type 2 diabetes mellitus with other specified complication: Secondary | ICD-10-CM

## 2020-07-24 ENCOUNTER — Other Ambulatory Visit (HOSPITAL_COMMUNITY): Payer: Self-pay | Admitting: Interventional Radiology

## 2020-07-24 ENCOUNTER — Encounter: Payer: Self-pay | Admitting: Podiatry

## 2020-07-24 ENCOUNTER — Other Ambulatory Visit: Payer: Self-pay

## 2020-07-24 ENCOUNTER — Ambulatory Visit (HOSPITAL_COMMUNITY)
Admission: RE | Admit: 2020-07-24 | Discharge: 2020-07-24 | Disposition: A | Discharge status: Home / Self Care | Payer: BC Managed Care – PPO | Source: Ambulatory Visit | Attending: Interventional Radiology | Admitting: Interventional Radiology

## 2020-07-24 DIAGNOSIS — I10 Essential (primary) hypertension: Secondary | ICD-10-CM | POA: Insufficient documentation

## 2020-07-24 DIAGNOSIS — E11621 Type 2 diabetes mellitus with foot ulcer: Secondary | ICD-10-CM | POA: Diagnosis not present

## 2020-07-24 DIAGNOSIS — Z794 Long term (current) use of insulin: Secondary | ICD-10-CM | POA: Diagnosis not present

## 2020-07-24 DIAGNOSIS — I739 Peripheral vascular disease, unspecified: Secondary | ICD-10-CM

## 2020-07-24 DIAGNOSIS — Z87891 Personal history of nicotine dependence: Secondary | ICD-10-CM | POA: Insufficient documentation

## 2020-07-24 DIAGNOSIS — I70262 Atherosclerosis of native arteries of extremities with gangrene, left leg: Secondary | ICD-10-CM | POA: Insufficient documentation

## 2020-07-24 DIAGNOSIS — E1152 Type 2 diabetes mellitus with diabetic peripheral angiopathy with gangrene: Secondary | ICD-10-CM | POA: Insufficient documentation

## 2020-07-24 DIAGNOSIS — L97529 Non-pressure chronic ulcer of other part of left foot with unspecified severity: Secondary | ICD-10-CM | POA: Insufficient documentation

## 2020-07-24 DIAGNOSIS — Z79899 Other long term (current) drug therapy: Secondary | ICD-10-CM | POA: Diagnosis not present

## 2020-07-24 HISTORY — PX: IR US GUIDE VASC ACCESS LEFT: IMG2389

## 2020-07-24 HISTORY — PX: IR ANGIOGRAM EXTREMITY LEFT: IMG651

## 2020-07-24 HISTORY — PX: IR TIB-PERO ART ATHEREC INC PTA MOD SED: IMG2314

## 2020-07-24 LAB — GLUCOSE, CAPILLARY: Glucose-Capillary: 152 mg/dL — ABNORMAL HIGH (ref 70–99)

## 2020-07-24 LAB — BASIC METABOLIC PANEL
Anion gap: 6 (ref 5–15)
BUN: <5 mg/dL — ABNORMAL LOW (ref 6–20)
CO2: 26 mmol/L (ref 22–32)
Calcium: 8.9 mg/dL (ref 8.9–10.3)
Chloride: 103 mmol/L (ref 98–111)
Creatinine, Ser: 0.89 mg/dL (ref 0.61–1.24)
GFR, Estimated: >60 mL/min (ref >60)
Glucose, Bld: 143 mg/dL — ABNORMAL HIGH (ref 70–99)
Potassium: 3.7 mmol/L (ref 3.5–5.1)
Sodium: 135 mmol/L (ref 135–145)

## 2020-07-24 LAB — CBC
HCT: 36 % — ABNORMAL LOW (ref 39.0–52.0)
Hemoglobin: 13 g/dL (ref 13.0–17.0)
MCH: 32.7 pg (ref 26.0–34.0)
MCHC: 36.1 g/dL — ABNORMAL HIGH (ref 30.0–36.0)
MCV: 90.7 fL (ref 80.0–100.0)
Platelets: 98 10*3/uL — ABNORMAL LOW (ref 150–400)
RBC: 3.97 MIL/uL — ABNORMAL LOW (ref 4.22–5.81)
RDW: 12.1 % (ref 11.5–15.5)
WBC: 6.7 10*3/uL (ref 4.0–10.5)
nRBC: 0 % (ref 0.0–0.2)

## 2020-07-24 LAB — PROTIME-INR
INR: 1 (ref 0.8–1.2)
Prothrombin Time: 13.2 seconds (ref 11.4–15.2)

## 2020-07-24 MED ORDER — CLOPIDOGREL BISULFATE 75 MG PO TABS: 75.0000 mg | ORAL_TABLET | Freq: Every day | ORAL | 0 refills | Status: AC

## 2020-07-24 MED ORDER — ASPIRIN 325 MG PO TABS
ORAL_TABLET | ORAL | Status: AC
  Administered 2020-07-24: 650 mg via ORAL
  Filled 2020-07-24: qty 2

## 2020-07-24 MED ORDER — SODIUM CHLORIDE 0.9 % IV SOLN: INTRAVENOUS | Status: DC

## 2020-07-24 MED ORDER — ASPIRIN 325 MG PO TABS: 650.0000 mg | ORAL_TABLET | Freq: Once | ORAL | Status: AC

## 2020-07-24 MED ORDER — FENTANYL CITRATE (PF) 100 MCG/2ML IJ SOLN
INTRAMUSCULAR | Status: AC | PRN
  Administered 2020-07-24 (×6): 50 ug via INTRAVENOUS

## 2020-07-24 MED ORDER — IOHEXOL 300 MG/ML  SOLN
150.0000 mL | Freq: Once | INTRAMUSCULAR | Status: AC | PRN
  Administered 2020-07-24: 125 mL via INTRA_ARTERIAL

## 2020-07-24 MED ORDER — IOHEXOL 300 MG/ML  SOLN
150.0000 mL | Freq: Once | INTRAMUSCULAR | Status: AC | PRN
  Administered 2020-07-24: 65 mL via INTRA_ARTERIAL

## 2020-07-24 MED ORDER — CLOPIDOGREL BISULFATE 300 MG PO TABS
ORAL_TABLET | ORAL | Status: AC
  Administered 2020-07-24: 300 mg via ORAL
  Filled 2020-07-24: qty 1

## 2020-07-24 MED ORDER — MIDAZOLAM HCL 2 MG/2ML IJ SOLN
INTRAMUSCULAR | Status: AC
  Filled 2020-07-24: qty 6

## 2020-07-24 MED ORDER — IOHEXOL 240 MG/ML SOLN
INTRAMUSCULAR | Status: AC
  Filled 2020-07-24: qty 100

## 2020-07-24 MED ORDER — FENTANYL CITRATE (PF) 100 MCG/2ML IJ SOLN
INTRAMUSCULAR | Status: AC
  Filled 2020-07-24: qty 4

## 2020-07-24 MED ORDER — MIDAZOLAM HCL 2 MG/2ML IJ SOLN
INTRAMUSCULAR | Status: AC | PRN
  Administered 2020-07-24 (×4): 1 mg via INTRAVENOUS

## 2020-07-24 MED ORDER — HEPARIN SODIUM (PORCINE) 1000 UNIT/ML IJ SOLN
INTRAMUSCULAR | Status: AC
  Filled 2020-07-24: qty 1

## 2020-07-24 MED ORDER — FENTANYL CITRATE (PF) 100 MCG/2ML IJ SOLN
INTRAMUSCULAR | Status: AC
  Filled 2020-07-24: qty 2

## 2020-07-24 MED ORDER — HEPARIN SODIUM (PORCINE) 1000 UNIT/ML IJ SOLN
INTRAMUSCULAR | Status: AC | PRN
  Administered 2020-07-24: 10000 [IU] via INTRAVENOUS

## 2020-07-24 MED ORDER — LIDOCAINE HCL 1 % IJ SOLN
INTRAMUSCULAR | Status: AC
  Filled 2020-07-24: qty 20

## 2020-07-24 MED ORDER — CLOPIDOGREL BISULFATE 75 MG PO TABS: 300.0000 mg | ORAL_TABLET | Freq: Once | ORAL | Status: AC

## 2020-07-24 MED ORDER — LIDOCAINE HCL (PF) 1 % IJ SOLN
INTRAMUSCULAR | Status: AC | PRN
  Administered 2020-07-24: 30 mL

## 2020-07-24 NOTE — Sedation Documentation (Signed)
LEFT femoral arterial puncture site drsg saturated with blood upon arrival to short stay [place] 4. Drsg removed. Small venous ooze revealed. Manual pressure applied for 5 minutes. New gauze/tegaderm drsg applied. Groin level 0. Sonia Baller, RN, recovery nurse, at bedside and aware.

## 2020-07-24 NOTE — H&P (Signed)
Chief Complaint: Patient was seen in consultation today for lower extremity atherosclerosis  Referring Physician(s): Esmeralda  Supervising Physician: Corrie Mckusick  Patient Status: [name]  History of Present Illness: [name] is a 55 y.o. male with past medical history of lower extremitiy atherosclerosis, DM, HTN with slow/poor healing of diabetic foot ulcers.  He was referred to [facility] for revascularization.  [name] was evaluated in consultation 07/17/20 by Dr. Earleen Newport.  Baesd on his lower extremity exam including dopplers and ABI he is suspected to have at least tibial disease and recommendation was made for intervention as soon as possible.   [name] presents to [facility] today in his usual state of health.  He reports he did undergo amputation of the 2nd left toe and feels his recovery is going well.  Denies any new concerns or wounds since last assessment.  He does report anxiety related to the procedure today.  He has been NPO.  He has not yet initiated aspirin or Plavix. He does not take blood thinners.   Past Medical History:  Diagnosis Date  . Cirrhosis ([place])   . Colon polyps   . Diabetes mellitus without complication ([place])   . Esophageal candidiasis ([place])   . GERD (gastroesophageal reflux disease)    as a child, some as an adult  . Hypertension   . Sleep apnea    does not use Cpap  . Thrombocytopenia ([place])   . Wears glasses     Past Surgical History:  Procedure Laterality Date  . APPENDECTOMY    . BACK SURGERY    . COLONOSCOPY    . COLONOSCOPY WITH PROPOFOL N/A 07/02/2019   Procedure: COLONOSCOPY WITH PROPOFOL;  Surgeon: Rush Landmark Telford Nab., MD;  Location: [place];  Service: Gastroenterology;  Laterality: N/A;  . COLONOSCOPY WITH PROPOFOL N/A 09/17/2019   Procedure: COLONOSCOPY WITH PROPOFOL;  Surgeon: Rush Landmark Telford Nab., MD;  Location: [place];  Service: Gastroenterology;  Laterality: N/A;  . ENDOSCOPIC MUCOSAL RESECTION N/A  09/17/2019   Procedure: ENDOSCOPIC MUCOSAL RESECTION;  Surgeon: Rush Landmark Telford Nab., MD;  Location: [place];  Service: Gastroenterology;  Laterality: N/A;  . HEMOSTASIS CLIP PLACEMENT  09/17/2019   Procedure: HEMOSTASIS CLIP PLACEMENT;  Surgeon: Irving Copas., MD;  Location: [place];  Service: Gastroenterology;;  . IR RADIOLOGIST EVAL & MGMT  07/17/2020  . SUBMUCOSAL LIFTING INJECTION  09/17/2019   Procedure: SUBMUCOSAL LIFTING INJECTION;  Surgeon: Rush Landmark Telford Nab., MD;  Location: [place];  Service: Gastroenterology;;    Allergies: Patient has no known allergies.  Medications: Prior to Admission medications   Medication Sig Start Date End Date Taking? Authorizing Provider  atorvastatin (LIPITOR) 20 MG tablet Take 1 tablet (20 mg total) by mouth at bedtime. 01/16/20  Yes Bast, Traci A, NP  doxycycline (VIBRA-TABS) 100 MG tablet Take 1 tablet (100 mg total) by mouth 2 (two) times daily. 07/11/20  Yes Felipa Furnace, DPM  Dulaglutide (TRULICITY) 8.27 MB/8.6LJ SOPN Inject 0.75 mg into the skin once a week. 01/30/20  Yes Abonza, Maritza, PA-C  ibuprofen (ADVIL) 800 MG tablet Take 1 tablet (800 mg total) by mouth every 6 (six) hours as needed. 07/14/20  Yes Felipa Furnace, DPM  losartan (COZAAR) 50 MG tablet Take 1 tablet (50 mg total) by mouth daily. 06/18/20  Yes Abonza, Maritza, PA-C  ondansetron (ZOFRAN) 4 MG tablet Take 1 tablet (4 mg total) by mouth every 8 (eight) hours as needed for nausea or vomiting. 07/14/20  Yes Posey Pronto,  [name], DPM  oxyCODONE-acetaminophen (PERCOCET) 5-325 MG tablet Take 1-2 tablets by mouth every 4 (four) hours as needed for severe pain. 07/14/20  Yes Felipa Furnace, DPM  blood glucose meter kit and supplies Dispense based on patient and insurance preference. Use up to four times daily as directed. (FOR ICD-10 E10.9, E11.9). Use to check fasting blood sugar and 2 hrs after largest meal. 06/18/20   Abonza, Maritza, PA-C  furosemide (LASIX) 20 MG  tablet Take 1 tablet (20 mg total) by mouth daily as needed for edema. 06/18/20   Lorrene Reid, PA-C  gabapentin (NEURONTIN) 600 MG tablet 2 tablets every morning, 2 tablets every afternoon, 3 tablets nightly. Patient taking differently: Take 1,200 mg by mouth See admin instructions. 2 tablets every morning, 2 tablets every afternoon, 3 tablets nightly. 06/18/20   Lorrene Reid, PA-C  metoCLOPramide (REGLAN) 10 MG tablet Take 1 tablet (10 mg total) by mouth 3 (three) times daily before meals. 06/18/20   Lorrene Reid, PA-C  polyethylene glycol (MIRALAX) 17 g packet Take 17 g by mouth daily as needed for moderate constipation. Increase as needed 01/16/20   Loura Halt A, NP  Vitamin D, Ergocalciferol, (DRISDOL) 1.25 MG (50000 UNIT) CAPS capsule Take 1 capsule (50,000 Units total) by mouth 2 (two) times a week. Wednesdays & Sundays. 01/17/20   Orvan July, NP     Family History  Problem Relation Age of Onset  . Brain cancer Mother   . Diabetes Father   . Breast cancer Sister   . Colon cancer Neg Hx   . Liver cancer Neg Hx   . Esophageal cancer Neg Hx   . Inflammatory bowel disease Neg Hx   . Liver disease Neg Hx   . Pancreatic cancer Neg Hx   . Rectal cancer Neg Hx   . Stomach cancer Neg Hx   . Colon polyps Neg Hx     Social History   Socioeconomic History  . Marital status: Married    Spouse name: Not on file  . Number of children: 1  . Years of education: Not on file  . Highest education level: Not on file  Occupational History  . Occupation: unemployed  Tobacco Use  . Smoking status: Former Smoker    Types: Cigarettes    Quit date: 06/23/1995    Years since quitting: 25.1  . Smokeless tobacco: Current User    Types: Chew  Vaping Use  . Vaping Use: Never used  Substance and Sexual Activity  . Alcohol use: Yes    Comment: 3 drinks a month  . Drug use: Yes    Types: Marijuana  . Sexual activity: Yes    Birth control/protection: None  Other Topics Concern  . Not on  file  Social History Narrative  . Not on file   Social Determinants of Health   Financial Resource Strain: Not on file  Food Insecurity: Not on file  Transportation Needs: Not on file  Physical Activity: Not on file  Stress: Not on file  Social Connections: Not on file     Review of Systems: A 12 point ROS discussed and pertinent positives are indicated in the HPI above.  All other systems are negative.  Review of Systems  Vital Signs: BP (!) 148/77   Pulse 73   Temp 98.1 F (36.7 C)   Ht '5\' 11"'  (1.803 m)   Wt 225 lb 8.5 oz (102.3 kg)   SpO2 100%   BMI 31.46 kg/m  Physical Exam Vitals and nursing note reviewed.  Constitutional:      General: He is not in acute distress.    Appearance: Normal appearance. He is not ill-appearing.  HENT:     Mouth/Throat:     Mouth: Mucous membranes are moist.     Pharynx: Oropharynx is clear.  Cardiovascular:     Rate and Rhythm: Normal rate and regular rhythm.  Pulmonary:     Effort: Pulmonary effort is normal. No respiratory distress.     Breath sounds: Normal breath sounds.  Abdominal:     General: Abdomen is flat.     Palpations: Abdomen is soft.  Musculoskeletal:     Right lower leg: No edema.     Left lower leg: No edema.     Comments: Left foot in boot.  2nd left toe removed at base.   Skin:    General: Skin is warm and dry.  Neurological:     General: No focal deficit present.     Mental Status: He is alert and oriented to person, place, and time. Mental status is at baseline.  Psychiatric:        Mood and Affect: Mood normal.        Behavior: Behavior normal.        Thought Content: Thought content normal.        Judgment: Judgment normal.      MD Evaluation Airway: WNL Heart: WNL Abdomen: WNL Chest/ Lungs: WNL ASA  Classification: 3 Mallampati/Airway Score: Two   Imaging: DG Foot Complete Left  Result Date: 07/23/2020 Please see detailed radiograph report in office note.  DG Foot Complete  Left  Result Date: 07/11/2020 Please see detailed radiograph report in office note.  VAS [place] ABI WITH/WO TBI  Result Date: 07/11/2020 LOWER EXTREMITY DOPPLER STUDY Indications: Claudication, and ulceration. second left toe High Risk Factors: Hypertension, Diabetes, past history of smoking.  Performing Technologist: Alvia Grove RVT  Examination Guidelines: A complete evaluation includes at minimum, Doppler waveform signals and systolic blood pressure reading at the level of bilateral brachial, anterior tibial, and posterior tibial arteries, when vessel segments are accessible. Bilateral testing is considered an integral part of a complete examination. Photoelectric Plethysmograph (PPG) waveforms and toe systolic pressure readings are included as required and additional duplex testing as needed. Limited examinations for reoccurring indications may be performed as noted.  ABI Findings: +---------+------------------+-----+----------+--------+ Right    Rt Pressure (mmHg)IndexWaveform  Comment  +---------+------------------+-----+----------+--------+ Brachial 142                                       +---------+------------------+-----+----------+--------+ PTA      135               0.95 monophasic         +---------+------------------+-----+----------+--------+ DP       133               0.94 biphasic           +---------+------------------+-----+----------+--------+ Great Toe75                0.53 Abnormal           +---------+------------------+-----+----------+--------+ +---------+------------------+-----+----------+-------+ Left     Lt Pressure (mmHg)IndexWaveform  Comment +---------+------------------+-----+----------+-------+ Brachial 137                                      +---------+------------------+-----+----------+-------+  PTA      181               1.27 monophasic        +---------+------------------+-----+----------+-------+ DP       122                0.86 monophasic        +---------+------------------+-----+----------+-------+ Great Toe45                0.32 Abnormal          +---------+------------------+-----+----------+-------+   Findings reported to Message left with triage at 3:43. Summary: Right: Resting right ankle-brachial index indicates mild right lower extremity arterial disease using the DPA . The right toe-brachial index is abnormal. Left: The left toe-brachial index is abnormal. ABIs are unreliable.  *See table(s) above for measurements and observations.  Electronically signed by Servando Snare MD on 07/11/2020 at 4:41:50 PM.    Final    IR Radiologist Eval & Mgmt  Result Date: 07/17/2020 Please refer to notes tab for details about interventional procedure. (Op Note)   Labs:  CBC: No results for input(s): WBC, HGB, HCT, PLT in the last 8760 hours.  COAGS: No results for input(s): INR, APTT in the last 8760 hours.  BMP: Recent Labs    01/24/20 1401  NA 137  K 3.8  CL 100  CO2 29  GLUCOSE 197*  BUN 6  CALCIUM 9.3  CREATININE 1.12  GFRNONAA 76  GFRAA 87    LIVER FUNCTION TESTS: Recent Labs    01/24/20 1401  BILITOT 0.9  AST 35  ALT 33  ALKPHOS 192*  PROT 7.1  ALBUMIN 3.6*    TUMOR MARKERS: No results for input(s): AFPTM, CEA, CA199, CHROMGRNA in the last 8760 hours.  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  IR consulted for angiogram with intervention at the request of Dr. Posey Pronto. Case reviewed by Dr. Earleen Newport who has met with the patient in consultation and approves patient for procedure.  Patient presents today in their usual state of health.  He has been NPO and is not currently on blood thinners.  Per Dr. Earleen Newport, load with 300 mg Plavix, 650 mg aspirin.   Risks and benefits of angiogram with intervention were discussed with the patient including, but not limited to  bleeding, infection, vascular injury or contrast induced renal failure.  This interventional procedure involves the use of X-rays and because of the nature of the planned procedure, it is possible that we will have prolonged use of X-ray fluoroscopy.  Potential radiation risks to you include (but are not limited to) the following: - A slightly elevated risk for cancer  several years later in life. This risk is typically less than 0.5% percent. This risk is low in comparison to the normal incidence of human cancer, which is 33% for women and 50% for men according to the [place]. - Radiation induced injury can include skin redness, resembling a rash, tissue breakdown / ulcers and hair loss (which can be temporary or permanent).   The likelihood of either of these occurring depends on the difficulty of the procedure and whether you are sensitive to radiation due to previous procedures, disease, or genetic conditions.   IF your procedure requires a prolonged use of radiation, you will be notified and given written instructions for further action.  It is your responsibility to monitor the  irradiated area for the 2 weeks following the procedure and to notify your physician if you are concerned that you have suffered a radiation induced injury.    All of the patient's questions were answered, patient is agreeable to proceed.  Consent signed and in chart.  Thank you for this interesting consult.  I greatly enjoyed meeting [name] and look forward to participating in their care.  A copy of this report was sent to the requesting provider on this date.  Electronically Signed: Docia Barrier, PA 07/24/2020, 8:39 AM   I spent a total of  30 Minutes   in face to face in clinical consultation, greater than 50% of which was counseling/coordinating care for left 2nd toe gangrene, lower extremity atherosclerosis.

## 2020-07-24 NOTE — Discharge Instructions (Signed)
Femoral Site Care  This sheet gives you information about how to care for yourself after your procedure. Your health care provider may also give you more specific instructions. If you have problems or questions, contact your health care provider. What can I expect after the procedure? After the procedure, it is common to have:  Bruising that usually fades within 1-2 weeks.  Tenderness at the site. Follow these instructions at home: Wound care  Follow instructions from your health care provider about how to take care of your insertion site. Make sure you: ? Wash your hands with soap and water before you change your bandage (dressing). If soap and water are not available, use hand sanitizer. ? Change your dressing as told by your health care provider. ? Leave stitches (sutures), skin glue, or adhesive strips in place. These skin closures may need to stay in place for 2 weeks or longer. If adhesive strip edges start to loosen and curl up, you may trim the loose edges. Do not remove adhesive strips completely unless your health care provider tells you to do that.  Do not take baths, swim, or use a hot tub until your health care provider approves.  You may shower 24-48 hours after the procedure or as told by your health care provider. ? Gently wash the site with plain soap and water. ? Pat the area dry with a clean towel. ? Do not rub the site. This may cause bleeding.  Do not apply powder or lotion to the site. Keep the site clean and dry.  Check your femoral site every day for signs of infection. Check for: ? Redness, swelling, or pain. ? Fluid or blood. ? Warmth. ? Pus or a bad smell. Activity  For the first 2-3 days after your procedure, or as long as directed: ? Avoid climbing stairs as much as possible. ? Do not squat.  Do not lift anything that is heavier than 10 lb (4.5 kg), or the limit that you are told, until your health care provider says that it is safe.  Rest as  directed. ? Avoid sitting for a long time without moving. Get up to take short walks every 1-2 hours.  Do not drive for 24 hours if you were given a medicine to help you relax (sedative). General instructions  Take over-the-counter and prescription medicines only as told by your health care provider.  Keep all follow-up visits as told by your health care provider. This is important. Contact a health care provider if you have:  A fever or chills.  You have redness, swelling, or pain around your insertion site. Get help right away if:  The catheter insertion area swells very fast.  You pass out.  You suddenly start to sweat or your skin gets clammy.  The catheter insertion area is bleeding, and the bleeding does not stop when you hold steady pressure on the area.  The area near or just beyond the catheter insertion site becomes pale, cool, tingly, or numb. These symptoms may represent a serious problem that is an emergency. Do not wait to see if the symptoms will go away. Get medical help right away. Call your local emergency services (911 in the [place] Do not drive yourself to the hospital. Summary  After the procedure, it is common to have bruising that usually fades within 1-2 weeks.  Check your femoral site every day for signs of infection.  Do not lift anything that is heavier than 10 lb (4.5 kg), or   the limit that you are told, until your health care provider says that it is safe. This information is not intended to replace advice given to you by your health care provider. Make sure you discuss any questions you have with your health care provider. Document Revised: 12/21/2019 Document Reviewed: 12/21/2019 Elsevier Patient Education  2021 Elsevier Inc.  

## 2020-07-24 NOTE — Procedures (Addendum)
Interventional Radiology Procedure Note  Procedure:   US guided left CFA access Left LE angiogram Treatment of left AT occlusion, with laser atherectomy and PTA, including DEB at the AT origin  Deployment of CELT device at the right CFA access .  Complications: None  Findings:  Pre:       Post:        Recommendations:  - Ok to shower tomorrow - Do not submerge for 7 days - Routine wound care - May DC after 1 hour of sedation recovery.  The CELT device has indication for immediate ambulation - Advance diet - Follow up with Dr. Earleen Newport in ~4 weeks.  Continue wound care - Dual anti-platelet therapy, with plavix 75mg  x 90 days, and ASA 81 mg daily.  Plavix Rx has been forwarded to [place]. Earleen Newport, DO

## 2020-07-25 ENCOUNTER — Encounter: Payer: Self-pay | Admitting: Podiatry

## 2020-07-25 NOTE — Progress Notes (Signed)
Subjective:  Patient ID: [name], male    DOB: [birth year],  MRN: [id number]  Chief Complaint  Patient presents with  . Routine Post Op    Post op dos 3.14.22     Procedure: Left second digit amputation partial  55 y.o. male returns for post-op check.  Patient is doing well.  He has been keeping the bandages clean dry and intact.  Ambulating with surgical shoe.  Denies any other acute complaints.  Review of Systems: Negative except as noted in the HPI. Denies N/V/F/Ch.  Past Medical History:  Diagnosis Date  . Cirrhosis ([place])   . Colon polyps   . Diabetes mellitus without complication ([place])   . Esophageal candidiasis ([place])   . GERD (gastroesophageal reflux disease)    as a child, some as an adult  . Hypertension   . Sleep apnea    does not use Cpap  . Thrombocytopenia ([place])   . Wears glasses     Current Outpatient Medications:  .  atorvastatin (LIPITOR) 20 MG tablet, Take 1 tablet (20 mg total) by mouth at bedtime., Disp: 90 tablet, Rfl: 0 .  blood glucose meter kit and supplies, Dispense based on patient and insurance preference. Use up to four times daily as directed. (FOR ICD-10 E10.9, E11.9). Use to check fasting blood sugar and 2 hrs after largest meal., Disp: 1 each, Rfl: 0 .  clopidogrel (PLAVIX) 75 MG tablet, Take 1 tablet (75 mg total) by mouth daily., Disp: 90 tablet, Rfl: 0 .  doxycycline (VIBRA-TABS) 100 MG tablet, Take 1 tablet (100 mg total) by mouth 2 (two) times daily., Disp: 20 tablet, Rfl: 0 .  Dulaglutide (TRULICITY) 8.54 OE/7.0JJ SOPN, Inject 0.75 mg into the skin once a week., Disp: 2 mL, Rfl: 3 .  furosemide (LASIX) 20 MG tablet, Take 1 tablet (20 mg total) by mouth daily as needed for edema., Disp: 90 tablet, Rfl: 0 .  gabapentin (NEURONTIN) 600 MG tablet, 2 tablets every morning, 2 tablets every afternoon, 3 tablets nightly. (Patient taking differently: Take 1,200 mg by mouth See admin instructions. 2 tablets every morning, 2 tablets every  afternoon, 3 tablets nightly.), Disp: 630 tablet, Rfl: 0 .  ibuprofen (ADVIL) 800 MG tablet, Take 1 tablet (800 mg total) by mouth every 6 (six) hours as needed., Disp: 60 tablet, Rfl: 1 .  losartan (COZAAR) 50 MG tablet, Take 1 tablet (50 mg total) by mouth daily., Disp: 90 tablet, Rfl: 0 .  metoCLOPramide (REGLAN) 10 MG tablet, Take 1 tablet (10 mg total) by mouth 3 (three) times daily before meals., Disp: 270 tablet, Rfl: 0 .  ondansetron (ZOFRAN) 4 MG tablet, Take 1 tablet (4 mg total) by mouth every 8 (eight) hours as needed for nausea or vomiting., Disp: 20 tablet, Rfl: 0 .  oxyCODONE-acetaminophen (PERCOCET) 5-325 MG tablet, Take 1-2 tablets by mouth every 4 (four) hours as needed for severe pain., Disp: 30 tablet, Rfl: 0 .  polyethylene glycol (MIRALAX) 17 g packet, Take 17 g by mouth daily as needed for moderate constipation. Increase as needed, Disp: , Rfl:  .  Vitamin D, Ergocalciferol, (DRISDOL) 1.25 MG (50000 UNIT) CAPS capsule, Take 1 capsule (50,000 Units total) by mouth 2 (two) times a week. Wednesdays & Sundays., Disp: 30 capsule, Rfl: 1  Social History   Tobacco Use  Smoking Status Former Smoker  . Types: Cigarettes  . Quit date: 06/23/1995  . Years since quitting: 25.1  Smokeless Tobacco Current User  . Types: Chew  No Known Allergies Objective:  There were no vitals filed for this visit. There is no height or weight on file to calculate BMI. Constitutional Well developed. Well nourished.  Vascular Foot warm and well perfused. Capillary refill normal to all digits.   Neurologic Normal speech. Oriented to person, place, and time. Epicritic sensation to light touch grossly present bilaterally.  Dermatologic Skin healing well without signs of infection. Skin edges well coapted without signs of infection.  Orthopedic: Tenderness to palpation noted about the surgical site.   Radiographs: 3 views of skeletally mature adult left foot: Sharp surgical margins noted.   Status post amputation of left second digit partially Assessment:   1. History of amputation of lesser toe of left foot ([place])   2. Type 2 diabetes mellitus with other specified complication, without long-term current use of insulin ([place])   3. Acute osteomyelitis of toe, left ([place])    Plan:  Patient was evaluated and treated and all questions answered.  S/p foot surgery left -Progressing as expected post-operatively. -XR: See above -WB Status: Weightbearing as tolerated in surgical shoe -Sutures: Intact.  No signs of dehiscence noted.  No clinical signs of infection noted. -Medications: None -Foot redressed.  No follow-ups on file.

## 2020-08-06 ENCOUNTER — Other Ambulatory Visit: Payer: Self-pay

## 2020-08-06 ENCOUNTER — Encounter: Payer: Self-pay | Admitting: Podiatry

## 2020-08-06 ENCOUNTER — Ambulatory Visit (INDEPENDENT_AMBULATORY_CARE_PROVIDER_SITE_OTHER): Payer: BC Managed Care – PPO | Admitting: Podiatry

## 2020-08-06 DIAGNOSIS — Z89422 Acquired absence of other left toe(s): Secondary | ICD-10-CM | POA: Diagnosis not present

## 2020-08-06 DIAGNOSIS — E1169 Type 2 diabetes mellitus with other specified complication: Secondary | ICD-10-CM | POA: Diagnosis not present

## 2020-08-06 NOTE — Progress Notes (Signed)
Subjective:  Patient ID: [name], male    DOB: 1966/03/23,  MRN: [id number]  Chief Complaint  Patient presents with  . Routine Post Op    PT stated that he is doing okay      Procedure: Left second digit amputation partial  55 y.o. male returns for post-op check.  Patient is doing well.  He has been keeping the bandages clean dry and intact.  Ambulating with surgical shoe.  Denies any other acute complaints.  Review of Systems: Negative except as noted in the HPI. Denies N/V/F/Ch.  Past Medical History:  Diagnosis Date  . Cirrhosis ([place])   . Colon polyps   . Diabetes mellitus without complication ([place])   . Esophageal candidiasis ([place])   . GERD (gastroesophageal reflux disease)    as a child, some as an adult  . Hypertension   . Sleep apnea    does not use Cpap  . Thrombocytopenia ([place])   . Wears glasses     Current Outpatient Medications:  .  atorvastatin (LIPITOR) 20 MG tablet, Take 1 tablet (20 mg total) by mouth at bedtime., Disp: 90 tablet, Rfl: 0 .  blood glucose meter kit and supplies, Dispense based on patient and insurance preference. Use up to four times daily as directed. (FOR ICD-10 E10.9, E11.9). Use to check fasting blood sugar and 2 hrs after largest meal., Disp: 1 each, Rfl: 0 .  clopidogrel (PLAVIX) 75 MG tablet, Take 1 tablet (75 mg total) by mouth daily., Disp: 90 tablet, Rfl: 0 .  doxycycline (VIBRA-TABS) 100 MG tablet, Take 1 tablet (100 mg total) by mouth 2 (two) times daily., Disp: 20 tablet, Rfl: 0 .  Dulaglutide (TRULICITY) 8.58 IF/0.2DX SOPN, Inject 0.75 mg into the skin once a week., Disp: 2 mL, Rfl: 3 .  furosemide (LASIX) 20 MG tablet, Take 1 tablet (20 mg total) by mouth daily as needed for edema., Disp: 90 tablet, Rfl: 0 .  gabapentin (NEURONTIN) 600 MG tablet, 2 tablets every morning, 2 tablets every afternoon, 3 tablets nightly. (Patient taking differently: Take 1,200 mg by mouth See admin instructions. 2 tablets every morning, 2 tablets  every afternoon, 3 tablets nightly.), Disp: 630 tablet, Rfl: 0 .  ibuprofen (ADVIL) 800 MG tablet, Take 1 tablet (800 mg total) by mouth every 6 (six) hours as needed., Disp: 60 tablet, Rfl: 1 .  losartan (COZAAR) 50 MG tablet, Take 1 tablet (50 mg total) by mouth daily., Disp: 90 tablet, Rfl: 0 .  metoCLOPramide (REGLAN) 10 MG tablet, Take 1 tablet (10 mg total) by mouth 3 (three) times daily before meals., Disp: 270 tablet, Rfl: 0 .  ondansetron (ZOFRAN) 4 MG tablet, Take 1 tablet (4 mg total) by mouth every 8 (eight) hours as needed for nausea or vomiting., Disp: 20 tablet, Rfl: 0 .  oxyCODONE-acetaminophen (PERCOCET) 5-325 MG tablet, Take 1-2 tablets by mouth every 4 (four) hours as needed for severe pain., Disp: 30 tablet, Rfl: 0 .  polyethylene glycol (MIRALAX) 17 g packet, Take 17 g by mouth daily as needed for moderate constipation. Increase as needed, Disp: , Rfl:  .  Vitamin D, Ergocalciferol, (DRISDOL) 1.25 MG (50000 UNIT) CAPS capsule, Take 1 capsule (50,000 Units total) by mouth 2 (two) times a week. Wednesdays & Sundays., Disp: 30 capsule, Rfl: 1  Social History   Tobacco Use  Smoking Status Former Smoker  . Types: Cigarettes  . Quit date: 06/23/1995  . Years since quitting: 25.1  Smokeless Tobacco Current User  .  Types: Chew    No Known Allergies Objective:  There were no vitals filed for this visit. There is no height or weight on file to calculate BMI. Constitutional Well developed. Well nourished.  Vascular Foot warm and well perfused. Capillary refill normal to all digits.   Neurologic Normal speech. Oriented to person, place, and time. Epicritic sensation to light touch grossly present bilaterally.  Dermatologic  skin completely epithelialized.  No clinical signs of infection noted no dehiscence noted.  Orthopedic:  No tenderness to palpation noted about the surgical site.   Radiographs: 3 views of skeletally mature adult left foot: Sharp surgical margins noted.   Status post amputation of left second digit partially Assessment:   1. History of amputation of lesser toe of left foot ([place])   2. Type 2 diabetes mellitus with other specified complication, without long-term current use of insulin ([place])    Plan:  Patient was evaluated and treated and all questions answered.  S/p foot surgery left -Progressing as expected post-operatively. -XR: See above -WB Status: Transition to regular shoe. -Sutures: Removed no signs of dehiscence noted.  No clinical signs of infection noted. -Medications: None -Patient can transition to regular shoes without reservation.  No follow-ups on file.

## 2020-09-09 ENCOUNTER — Other Ambulatory Visit: Payer: BC Managed Care – PPO

## 2020-09-09 ENCOUNTER — Other Ambulatory Visit: Payer: Self-pay

## 2020-09-09 DIAGNOSIS — Z0000 Encounter for general adult medical examination without abnormal findings: Secondary | ICD-10-CM

## 2020-09-09 DIAGNOSIS — E559 Vitamin D deficiency, unspecified: Secondary | ICD-10-CM

## 2020-09-09 DIAGNOSIS — E1169 Type 2 diabetes mellitus with other specified complication: Secondary | ICD-10-CM

## 2020-09-09 DIAGNOSIS — E782 Mixed hyperlipidemia: Secondary | ICD-10-CM

## 2020-09-09 DIAGNOSIS — R7989 Other specified abnormal findings of blood chemistry: Secondary | ICD-10-CM

## 2020-09-09 DIAGNOSIS — I152 Hypertension secondary to endocrine disorders: Secondary | ICD-10-CM

## 2020-09-10 LAB — COMPREHENSIVE METABOLIC PANEL
ALT: 28 IU/L (ref 0–44)
AST: 30 IU/L (ref 0–40)
Albumin/Globulin Ratio: 0.9 — ABNORMAL LOW (ref 1.2–2.2)
Albumin: 3.3 g/dL — ABNORMAL LOW (ref 3.8–4.9)
Alkaline Phosphatase: 167 IU/L — ABNORMAL HIGH (ref 44–121)
BUN/Creatinine Ratio: 6 — ABNORMAL LOW (ref 9–20)
BUN: 6 mg/dL (ref 6–24)
Bilirubin Total: 0.6 mg/dL (ref 0.0–1.2)
CO2: 24 mmol/L (ref 20–29)
Calcium: 9.2 mg/dL (ref 8.7–10.2)
Chloride: 100 mmol/L (ref 96–106)
Creatinine, Ser: 0.97 mg/dL (ref 0.76–1.27)
Globulin, Total: 3.7 g/dL (ref 1.5–4.5)
Glucose: 174 mg/dL — ABNORMAL HIGH (ref 65–99)
Potassium: 3.6 mmol/L (ref 3.5–5.2)
Sodium: 136 mmol/L (ref 134–144)
Total Protein: 7 g/dL (ref 6.0–8.5)
eGFR: 94 mL/min/{1.73_m2} (ref >59)

## 2020-09-10 LAB — CBC
Hematocrit: 34.5 % — ABNORMAL LOW (ref 37.5–51.0)
Hemoglobin: 12.4 g/dL — ABNORMAL LOW (ref 13.0–17.7)
MCH: 33.1 pg — ABNORMAL HIGH (ref 26.6–33.0)
MCHC: 35.9 g/dL — ABNORMAL HIGH (ref 31.5–35.7)
MCV: 92 fL (ref 79–97)
Platelets: 99 10*3/uL — CL (ref 150–450)
RBC: 3.75 x10E6/uL — ABNORMAL LOW (ref 4.14–5.80)
RDW: 12.1 % (ref 11.6–15.4)
WBC: 6.4 10*3/uL (ref 3.4–10.8)

## 2020-09-10 LAB — LIPID PANEL
Chol/HDL Ratio: 3.8 ratio (ref 0.0–5.0)
Cholesterol, Total: 107 mg/dL (ref 100–199)
HDL: 28 mg/dL — ABNORMAL LOW (ref >39)
LDL Chol Calc (NIH): 63 mg/dL (ref 0–99)
Triglycerides: 77 mg/dL (ref 0–149)
VLDL Cholesterol Cal: 16 mg/dL (ref 5–40)

## 2020-09-10 LAB — HEMOGLOBIN A1C
Est. average glucose Bld gHb Est-mCnc: 143 mg/dL
Hgb A1c MFr Bld: 6.6 % — ABNORMAL HIGH (ref 4.8–5.6)

## 2020-09-10 LAB — VITAMIN D 25 HYDROXY (VIT D DEFICIENCY, FRACTURES): Vit D, 25-Hydroxy: 35.6 ng/mL (ref 30.0–100.0)

## 2020-09-10 LAB — TSH: TSH: 0.628 u[IU]/mL (ref 0.450–4.500)

## 2020-09-15 ENCOUNTER — Other Ambulatory Visit: Payer: Self-pay

## 2020-09-15 ENCOUNTER — Encounter: Payer: Self-pay | Admitting: Physician Assistant

## 2020-09-15 ENCOUNTER — Ambulatory Visit (INDEPENDENT_AMBULATORY_CARE_PROVIDER_SITE_OTHER): Payer: BC Managed Care – PPO | Admitting: Physician Assistant

## 2020-09-15 VITALS — BP 151/77 | HR 86 | Temp 97.8°F | Ht 70.0 in | Wt 228.0 lb

## 2020-09-15 DIAGNOSIS — E782 Mixed hyperlipidemia: Secondary | ICD-10-CM | POA: Diagnosis not present

## 2020-09-15 DIAGNOSIS — D696 Thrombocytopenia, unspecified: Secondary | ICD-10-CM

## 2020-09-15 DIAGNOSIS — E1169 Type 2 diabetes mellitus with other specified complication: Secondary | ICD-10-CM

## 2020-09-15 DIAGNOSIS — I152 Hypertension secondary to endocrine disorders: Secondary | ICD-10-CM

## 2020-09-15 DIAGNOSIS — D649 Anemia, unspecified: Secondary | ICD-10-CM

## 2020-09-15 DIAGNOSIS — E559 Vitamin D deficiency, unspecified: Secondary | ICD-10-CM

## 2020-09-15 DIAGNOSIS — K5900 Constipation, unspecified: Secondary | ICD-10-CM

## 2020-09-15 DIAGNOSIS — E1142 Type 2 diabetes mellitus with diabetic polyneuropathy: Secondary | ICD-10-CM

## 2020-09-15 DIAGNOSIS — E1159 Type 2 diabetes mellitus with other circulatory complications: Secondary | ICD-10-CM | POA: Diagnosis not present

## 2020-09-15 DIAGNOSIS — M7989 Other specified soft tissue disorders: Secondary | ICD-10-CM

## 2020-09-15 LAB — POCT UA - MICROALBUMIN
Creatinine, POC: 300 mg/dL
Microalbumin Ur, POC: 80 mg/L

## 2020-09-15 MED ORDER — TRULICITY 0.75 MG/0.5ML ~~LOC~~ SOAJ: 0.7500 mg | SUBCUTANEOUS | 3 refills | Status: DC

## 2020-09-15 MED ORDER — VITAMIN D (ERGOCALCIFEROL) 1.25 MG (50000 UNIT) PO CAPS: 50000.0000 [IU] | ORAL_CAPSULE | ORAL | 0 refills | Status: DC

## 2020-09-15 MED ORDER — ATORVASTATIN CALCIUM 20 MG PO TABS: 20.0000 mg | ORAL_TABLET | Freq: Every day | ORAL | 1 refills | Status: DC

## 2020-09-15 MED ORDER — LINACLOTIDE 72 MCG PO CAPS: 72.0000 ug | ORAL_CAPSULE | Freq: Every day | ORAL | 0 refills | Status: DC

## 2020-09-15 NOTE — Assessment & Plan Note (Signed)
-  Stable. Continue Gabapentin. -Recommend daily foot care/checks. -Will continue to monitor.

## 2020-09-15 NOTE — Assessment & Plan Note (Signed)
-  Recent Vit D 35.6, has decreased from 93.4. Will send refill for once a week Vit D for 12 weeks. -Will continue to monitor.

## 2020-09-15 NOTE — Assessment & Plan Note (Addendum)
-  Recent A1c has improved from 7.9 to 6.6, at goal <9.6. -Continue Trulicity. Provided refill. -Continue ambulatory glucose monitoring. -UA microalbumin abnl, A:C 30-300 mg/d. Discussed with patient the importance of maintaining diabetes under good control to reduce progression of diabetes related complications. Will place referral to ophthalmology for diabetic eye exam.  -Will continue to monitor.

## 2020-09-15 NOTE — Assessment & Plan Note (Signed)
>>  ASSESSMENT AND PLAN FOR DIABETIC PERIPHERAL NEUROPATHY ASSOCIATED WITH TYPE 2 DIABETES MELLITUS WRITTEN ON 09/15/2020  5:53 PM BY ABONZA, MARITZA, PA-C  -Stable. Continue Gabapentin. -Recommend daily foot care/checks. -Will continue to monitor.

## 2020-09-15 NOTE — Progress Notes (Signed)
Established Patient Office Visit  Subjective:  Patient ID: [name], male    DOB: 05-05-1965  Age: 55 y.o. MRN: [id number]  CC:  Chief Complaint  Patient presents with  . Follow-up  . Diabetes  . Hypertension  . Hyperlipidemia    HPI [name] presents for follow up on diabetes mellitus, hypertension and hyperlipidemia. Patient reports chronic constipation. Takes Mirarlax and uses enema to help with disimpaction. Continues to have decreased appetite and fatigue. Reports drinks an ensure. Patient has seen gastroenterology and reports was told he has cirrhosis. States his father was an alcoholic and did not want to go through the same process so he has been careful about his alcohol intake. Reports zofran does not help with chronic nausea. Reglan has not made a difference with early satiety. Reports has 1 drink of bourbon once or twice per month.   Diabetes mellitus: Pt denies increased urination or thirst. Pt reports medication compliance. No hypoglycemic events. Checking glucose at home. Reports FBS fluctuate with readings in the 140s and sometimes 170s. Patient is s/p left toe amputation due to osteomyelitis. Reports unsteady balance and gait, which he attributes to recent amputation and neuropathy.  HTN: Pt denies chest pain, palpitations, dizziness or increased edema from baseline. Patient reports has not taken lasix recently. Will start having more swelling when sitting for prolonged period which will prompt him to get moving and swelling improves. Feels like Lasix works about the same. Taking medication as directed without side effects. Has not been checking BP at home. Pt continues to follow a low salt diet.  HLD: Pt taking medication as directed without issues. Denies side effects including myalgias and RUQ pain.   Vitamin D deficiency: Patient reports in the past was taking Vit D twice a week. Currently not taking Vitamin D supplement.  Anemia, low platelets: States has  not been evaluated by hematology. Does not know what is the cause of his anemia.   Past Medical History:  Diagnosis Date  . Cirrhosis ([place])   . Colon polyps   . Diabetes mellitus without complication ([place])   . Esophageal candidiasis ([place])   . GERD (gastroesophageal reflux disease)    as a child, some as an adult  . Hypertension   . Sleep apnea    does not use Cpap  . Thrombocytopenia ([place])   . Wears glasses     Past Surgical History:  Procedure Laterality Date  . APPENDECTOMY    . BACK SURGERY    . COLONOSCOPY    . COLONOSCOPY WITH PROPOFOL N/A 07/02/2019   Procedure: COLONOSCOPY WITH PROPOFOL;  Surgeon: Rush Landmark Telford Nab., MD;  Location: [facility];  Service: Gastroenterology;  Laterality: N/A;  . COLONOSCOPY WITH PROPOFOL N/A 09/17/2019   Procedure: COLONOSCOPY WITH PROPOFOL;  Surgeon: Rush Landmark Telford Nab., MD;  Location: [facility];  Service: Gastroenterology;  Laterality: N/A;  . ENDOSCOPIC MUCOSAL RESECTION N/A 09/17/2019   Procedure: ENDOSCOPIC MUCOSAL RESECTION;  Surgeon: Rush Landmark Telford Nab., MD;  Location: [facility];  Service: Gastroenterology;  Laterality: N/A;  . HEMOSTASIS CLIP PLACEMENT  09/17/2019   Procedure: HEMOSTASIS CLIP PLACEMENT;  Surgeon: Irving Copas., MD;  Location: [facility];  Service: Gastroenterology;;  . Everlean Alstrom ANGIOGRAM EXTREMITY LEFT  07/24/2020  . IR RADIOLOGIST EVAL & MGMT  07/17/2020  . IR TIB-PERO ART ATHEREC INC PTA MOD SED  07/24/2020  . IR US GUIDE VASC ACCESS LEFT  07/24/2020  . SUBMUCOSAL LIFTING INJECTION  09/17/2019   Procedure: SUBMUCOSAL LIFTING INJECTION;  Surgeon: Rush Landmark Telford Nab., MD;  Location: [facility] ENDOSCOPY;  Service: Gastroenterology;;    Family History  Problem Relation Age of Onset  . Brain cancer Mother   . Diabetes Father   . Breast cancer Sister   . Colon cancer Neg Hx   . Liver cancer Neg Hx   . Esophageal cancer Neg Hx   . Inflammatory bowel disease Neg Hx   . Liver disease Neg Hx   .  Pancreatic cancer Neg Hx   . Rectal cancer Neg Hx   . Stomach cancer Neg Hx   . Colon polyps Neg Hx     Social History   Socioeconomic History  . Marital status: Married    Spouse name: Not on file  . Number of children: 1  . Years of education: Not on file  . Highest education level: Not on file  Occupational History  . Occupation: unemployed  Tobacco Use  . Smoking status: Former Smoker    Types: Cigarettes    Quit date: 06/23/1995    Years since quitting: 25.2  . Smokeless tobacco: Current User    Types: Chew  Vaping Use  . Vaping Use: Never used  Substance and Sexual Activity  . Alcohol use: Yes    Comment: 3 drinks a month  . Drug use: Yes    Types: Marijuana  . Sexual activity: Yes    Birth control/protection: None  Other Topics Concern  . Not on file  Social History Narrative  . Not on file   Social Determinants of Health   Financial Resource Strain: Not on file  Food Insecurity: Not on file  Transportation Needs: Not on file  Physical Activity: Not on file  Stress: Not on file  Social Connections: Not on file  Intimate Partner Violence: Not on file    Outpatient Medications Prior to Visit  Medication Sig Dispense Refill  . gabapentin (NEURONTIN) 600 MG tablet 2 tablets every morning, 2 tablets every afternoon, 3 tablets nightly. (Patient taking differently: Take 1,200 mg by mouth See admin instructions. 2 tablets every morning, 2 tablets every afternoon, 3 tablets nightly.) 630 tablet 0  . losartan (COZAAR) 50 MG tablet Take 1 tablet (50 mg total) by mouth daily. 90 tablet 0  . polyethylene glycol (MIRALAX) 17 g packet Take 17 g by mouth daily as needed for moderate constipation. Increase as needed    . blood glucose meter kit and supplies Dispense based on patient and insurance preference. Use up to four times daily as directed. (FOR ICD-10 E10.9, E11.9). Use to check fasting blood sugar and 2 hrs after largest meal. 1 each 0  . clopidogrel (PLAVIX) 75  MG tablet Take 1 tablet (75 mg total) by mouth daily. 90 tablet 0  . doxycycline (VIBRA-TABS) 100 MG tablet Take 1 tablet (100 mg total) by mouth 2 (two) times daily. 20 tablet 0  . furosemide (LASIX) 20 MG tablet Take 1 tablet (20 mg total) by mouth daily as needed for edema. 90 tablet 0  . ibuprofen (ADVIL) 800 MG tablet Take 1 tablet (800 mg total) by mouth every 6 (six) hours as needed. 60 tablet 1  . metoCLOPramide (REGLAN) 10 MG tablet Take 1 tablet (10 mg total) by mouth 3 (three) times daily before meals. 270 tablet 0  . ondansetron (ZOFRAN) 4 MG tablet Take 1 tablet (4 mg total) by mouth every 8 (eight) hours as needed for nausea or vomiting. 20 tablet 0  . oxyCODONE-acetaminophen (PERCOCET) 5-325 MG tablet Take  1-2 tablets by mouth every 4 (four) hours as needed for severe pain. 30 tablet 0  . atorvastatin (LIPITOR) 20 MG tablet Take 1 tablet (20 mg total) by mouth at bedtime. 90 tablet 0  . Dulaglutide (TRULICITY) 2.94 TM/5.4YT SOPN Inject 0.75 mg into the skin once a week. 2 mL 3  . Vitamin D, Ergocalciferol, (DRISDOL) 1.25 MG (50000 UNIT) CAPS capsule Take 1 capsule (50,000 Units total) by mouth 2 (two) times a week. Wednesdays & Sundays. 30 capsule 1   No facility-administered medications prior to visit.    No Known Allergies  ROS Review of Systems Review of Systems:  A fourteen system review of systems was performed and found to be positive as per HPI.   Objective:    Physical Exam General:  Well Developed, well nourished, in no acute distress  Neuro:  Alert and oriented,  extra-ocular muscles intact  HEENT:  Normocephalic, atraumatic, neck supple  Skin:  no gross rash, warm, pink. Cardiac:  RRR, S1 S2 Respiratory:  ECTA B/L, Not using accessory muscles, speaking in full sentences- unlabored. Extremities: Second toe of left foot missing Vascular:  Ext warm, no cyanosis apprec.; cap RF less 2 sec. +edema Psych:  No HI/SI, judgement and insight good, Euthymic mood. Full  Affect.   BP (!) 151/77   Pulse 86   Temp 97.8 F (36.6 C)   Ht 5' 10" (1.778 m)   Wt 228 lb (103.4 kg)   SpO2 100%   BMI 32.71 kg/m  Wt Readings from Last 3 Encounters:  09/15/20 228 lb (103.4 kg)  07/24/20 225 lb 8.5 oz (102.3 kg)  06/18/20 225 lb 9.6 oz (102.3 kg)     Health Maintenance Due  Topic Date Due  . PNEUMOCOCCAL POLYSACCHARIDE VACCINE AGE 68-64 HIGH RISK  Never done  . OPHTHALMOLOGY EXAM  Never done  . HIV Screening  Never done  . TETANUS/TDAP  Never done  . COVID-19 Vaccine (3 - Booster for Moderna series) 01/28/2020    There are no preventive care reminders to display for this patient.  Lab Results  Component Value Date   TSH 0.628 09/09/2020   Lab Results  Component Value Date   WBC 6.4 09/09/2020   HGB 12.4 (L) 09/09/2020   HCT 34.5 (L) 09/09/2020   MCV 92 09/09/2020   PLT 99 (LL) 09/09/2020   Lab Results  Component Value Date   NA 136 09/09/2020   K 3.6 09/09/2020   CO2 24 09/09/2020   GLUCOSE 174 (H) 09/09/2020   BUN 6 09/09/2020   CREATININE 0.97 09/09/2020   BILITOT 0.6 09/09/2020   ALKPHOS 167 (H) 09/09/2020   AST 30 09/09/2020   ALT 28 09/09/2020   PROT 7.0 09/09/2020   ALBUMIN 3.3 (L) 09/09/2020   CALCIUM 9.2 09/09/2020   ANIONGAP 6 07/24/2020   EGFR 94 09/09/2020   GFR 102.34 05/11/2019   Lab Results  Component Value Date   CHOL 107 09/09/2020   Lab Results  Component Value Date   HDL 28 (L) 09/09/2020   Lab Results  Component Value Date   LDLCALC 63 09/09/2020   Lab Results  Component Value Date   TRIG 77 09/09/2020   Lab Results  Component Value Date   CHOLHDL 3.8 09/09/2020   Lab Results  Component Value Date   HGBA1C 6.6 (H) 09/09/2020      Assessment & Plan:   Problem List Items Addressed This Visit      Cardiovascular and Mediastinum  Hypertension associated with diabetes ([place]) (Chronic)    -Elevated in office today. Asymptomatic. Recommend to monitor BP/pulse at home for the next two weeks and  forward a copy. If BP consistently >135/85 then recommend medication adjustments. -Continue current medication regimen. Recent CMP, renal function and electrolytes wnl's. -Continue low sodium diet. -Will continue to monitor.      Relevant Medications   atorvastatin (LIPITOR) 20 MG tablet   Dulaglutide (TRULICITY) 7.82 NF/6.2ZH SOPN   Other Relevant Orders   POCT UA - Microalbumin (Completed)     Endocrine   Diabetes mellitus ([place]) - Primary (Chronic)    -Recent A1c has improved from 7.9 to 6.6, at goal <0.8. -Continue Trulicity. Provided refill. -Continue ambulatory glucose monitoring. -UA microalbumin abnl, A:C 30-300 mg/d. Discussed with patient the importance of maintaining diabetes under good control to reduce progression of diabetes related complications. Will place referral to ophthalmology for diabetic eye exam.  -Will continue to monitor.      Relevant Medications   atorvastatin (LIPITOR) 20 MG tablet   Dulaglutide (TRULICITY) 6.57 QI/6.9GE SOPN   Other Relevant Orders   POCT UA - Microalbumin (Completed)   Ambulatory referral to Ophthalmology   Diabetic peripheral neuropathy associated with type 2 diabetes mellitus ([place])    -Stable. Continue Gabapentin. -Recommend daily foot care/checks. -Will continue to monitor.      Relevant Medications   atorvastatin (LIPITOR) 20 MG tablet   Dulaglutide (TRULICITY) 9.52 WU/1.3KG SOPN   Mixed diabetic hyperlipidemia associated with type 2 diabetes mellitus ([name])    -Recent lipid panel: total cholesterol 107, triglycerides 7, HDL 28, LDL 63 (at goal <70). -Continue atorvastatin. Provided refill. -Recommend to follow a heart healthy diet and increase physical activity as tolerated. -Will continue to monitor.      Relevant Medications   atorvastatin (LIPITOR) 20 MG tablet   Dulaglutide (TRULICITY) 4.01 UU/7.2ZD SOPN   Other Relevant Orders   POCT UA - Microalbumin (Completed)     Other   Vitamin D deficiency (Chronic)     -Recent Vit D 35.6, has decreased from 93.4. Will send refill for once a week Vit D for 12 weeks. -Will continue to monitor.      Relevant Medications   Vitamin D, Ergocalciferol, (DRISDOL) 1.25 MG (50000 UNIT) CAPS capsule   Thrombocytopenia ([name])   Relevant Orders   Ambulatory referral to Hematology / Oncology   Swelling of both lower extremities    Other Visit Diagnoses    Anemia, unspecified type       Relevant Orders   Ambulatory referral to Hematology / Oncology   Constipation, unspecified constipation type       Relevant Medications   linaclotide (LINZESS) 72 MCG capsule     Anemia, Thrombocytopenia: -Recent CBC: WBC 6.4, RBC 3.75, Hemoglobin 12.4, Hematocrit 34.5, MCV 92, MCH 33.1, platelets 99 -Patient continues to complain of fatigue and decreased appetite and discussed would benefit from hematology evaluation to evaluate/rule out potential underlying cause of anemia and thrombocytopenia. Reviewed prior labs for iron panel which were essentially wnl's, folate 2.3 11/30/2018 and improved to 15.4 01/15/2019. 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 Dr. Havery Moros recommend hematology evaluation OV 07/25/2019. -Patient is on anticoagulant for s/p laser atherectomy and balloon angioplasty of anterior tibial artery. Advised obtaining hemoccult cards to r/o blood loss as potential etiology for anemia, patient declined and states plans to continue anticoagulant therapy.  -Will place referral to hematology/oncology.  Constipation: -Recommend to stay hydrated and will start trial  of Linzess. Provided savings card. -Will continue to monitor.   Swelling of both lower extremity: -Recommend to use compression socks, elevation and continue low sodium diet. Advised Lasix can be discontinued if symptoms are stable with conservative therapy.   Discussed most recent labs, most are essentially wnl's or stable from prior. Liver enzymes normal, alk  phosphatase has mildly improved from 192 to 167. Recommend to improve nutrition intake.   Meds ordered this encounter  Medications  . atorvastatin (LIPITOR) 20 MG tablet    Sig: Take 1 tablet (20 mg total) by mouth at bedtime.    Dispense:  90 tablet    Refill:  1    Order Specific Question:   Supervising Provider    Answer:   Beatrice Lecher D [[id number]]  . Dulaglutide (TRULICITY) 6.56 CL/2.7NT SOPN    Sig: Inject 0.75 mg into the skin once a week.    Dispense:  2 mL    Refill:  3    Order Specific Question:   Supervising Provider    Answer:   Beatrice Lecher D [[id number]]  . DISCONTD: Vitamin D, Ergocalciferol, (DRISDOL) 1.25 MG (50000 UNIT) CAPS capsule    Sig: Take 1 capsule (50,000 Units total) by mouth every 7 (seven) days. Wednesdays & Sundays.    Dispense:  12 capsule    Refill:  0    Order Specific Question:   Supervising Provider    Answer:   Beatrice Lecher D [[id number]]  . linaclotide (LINZESS) 72 MCG capsule    Sig: Take 1 capsule (72 mcg total) by mouth daily before breakfast.    Dispense:  90 capsule    Refill:  0    Order Specific Question:   Supervising Provider    Answer:   Beatrice Lecher D [[id number]]  . Vitamin D, Ergocalciferol, (DRISDOL) 1.25 MG (50000 UNIT) CAPS capsule    Sig: Take 1 capsule (50,000 Units total) by mouth every 7 (seven) days.    Dispense:  12 capsule    Refill:  0    Order Specific Question:   Supervising Provider    Answer:   Beatrice Lecher D [[id number]]    Follow-up: Return in about 3 months (around 12/16/2020) for DM, HTN, HLD.    Lorrene Reid, PA-C

## 2020-09-15 NOTE — Assessment & Plan Note (Signed)
-  Elevated in office today. Asymptomatic. Recommend to monitor BP/pulse at home for the next two weeks and forward a copy. If BP consistently >135/85 then recommend medication adjustments. -Continue current medication regimen. Recent CMP, renal function and electrolytes wnl's. -Continue low sodium diet. -Will continue to monitor.

## 2020-09-15 NOTE — Assessment & Plan Note (Signed)
-  Recent lipid panel: total cholesterol 107, triglycerides 7, HDL 28, LDL 63 (at goal <70). -Continue atorvastatin. Provided refill. -Recommend to follow a heart healthy diet and increase physical activity as tolerated. -Will continue to monitor.

## 2020-09-15 NOTE — Patient Instructions (Signed)
[facility]    Thrombocytopenia Thrombocytopenia is a condition in which you have a low number of platelets in your blood. Platelets are also called thrombocytes. Platelets are tiny cells in the blood. When you bleed, they clump together at the cut or injury to stop the bleeding. This is called blood clotting. Not having enough platelets can cause bleeding problems. Some cases of thrombocytopenia are mild while others are more severe. What are the causes? This condition may be caused by:  Decreased production of platelets. This may be caused by: ? Aplastic anemia. This is when your bone marrow stops making blood cells. ? Cancer in the bone marrow. ? Certain medicines, including chemotherapy. ? Infection in the bone marrow. ? Drinking a lot of alcohol.  Increased destruction of platelets. This may be caused by: ? Certain immune diseases. ? Certain medicines. ? Certain blood clotting disorders. ? Certain inherited disorders. ? Certain bleeding disorders. ? Pregnancy. ? Having an enlarged spleen (hypersplenism). In hypersplenism, the spleen gathers up platelets from circulation. This means that the platelets are not available to help with blood clotting. The spleen can be enlarged because of cirrhosis or other conditions. What are the signs or symptoms? Symptoms of this condition are the result of poor blood clotting. They will vary depending on how low the platelet counts are. Symptoms may include:  Abnormal bleeding.  Nosebleeds.  Heavy menstrual periods.  Blood in the urine or stool (feces).  A purplish discoloration in the skin (purpura).  Bruising.  A rash that looks like pinpoint, purplish-red spots (petechiae) on the skin and mucous membranes. How is this diagnosed? This condition may be diagnosed with blood tests and a physical exam. Sometimes, a sample of bone marrow may be removed to look for the original cells (megakaryocytes) that make platelets.  Other tests may be needed depending on the cause.   How is this treated? Treatment for this condition depends on the cause. Treatment options may include:  Treatment of another condition that is causing the low platelet count.  Medicines to help protect your platelets from being destroyed.  A replacement (transfusion) of platelets to stop or prevent bleeding.  Surgery to remove the spleen. Follow these instructions at home: Activity  Avoid activities that could cause injury or bruising, and follow instructions about how to prevent falls.  Take extra care not to cut yourself when you shave or when you use scissors, needles, knives, and other tools.  Take extra care to protect yourself from burns when ironing or cooking. General instructions  Check your skin and the inside of your mouth for bruising or bleeding as told by your health care provider.  Check your spit (sputum), urine, and stool for blood as told by your health care provider.  Do not drink alcohol.  Take over-the-counter and prescription medicines only as told by your health care provider.  Do not take any medicines that have aspirin or NSAIDs in them. These medicines can thin your blood and cause you to bleed more easily.  Tell all your health care providers, including dentists and eye doctors, about your condition.   Contact a health care provider if you have:  Unexplained bruising. Get help right away if you have:  Active bleeding from anywhere on your body.  Blood in your sputum, urine, or stool. Summary  Thrombocytopenia is a condition in which you have a low number of platelets in your blood.  Platelets are needed for blood clotting.  Symptoms of this  condition are the result of poor blood clotting and may include abnormal bleeding, nosebleeds, and bruising.  This condition may be diagnosed with blood tests and a physical exam.  Treatment for this condition depends on the cause. This information is  not intended to replace advice given to you by your health care provider. Make sure you discuss any questions you have with your health care provider. Document Revised: 01/19/2018 Document Reviewed: 01/19/2018 Elsevier Patient Education  [place].

## 2020-09-24 ENCOUNTER — Encounter: Payer: BC Managed Care – PPO | Admitting: Podiatry

## 2020-10-21 ENCOUNTER — Other Ambulatory Visit: Payer: Self-pay | Admitting: Physician Assistant

## 2020-10-21 DIAGNOSIS — I152 Hypertension secondary to endocrine disorders: Secondary | ICD-10-CM

## 2020-10-21 DIAGNOSIS — E1159 Type 2 diabetes mellitus with other circulatory complications: Secondary | ICD-10-CM

## 2020-10-21 DIAGNOSIS — E1142 Type 2 diabetes mellitus with diabetic polyneuropathy: Secondary | ICD-10-CM

## 2020-10-22 NOTE — Telephone Encounter (Signed)
error 

## 2020-12-13 ENCOUNTER — Other Ambulatory Visit: Payer: Self-pay | Admitting: Podiatry

## 2020-12-15 NOTE — Telephone Encounter (Signed)
Please advise 

## 2020-12-16 ENCOUNTER — Ambulatory Visit: Payer: BC Managed Care – PPO | Admitting: Physician Assistant

## 2021-01-13 ENCOUNTER — Other Ambulatory Visit: Payer: Self-pay | Admitting: Physician Assistant

## 2021-01-13 DIAGNOSIS — I152 Hypertension secondary to endocrine disorders: Secondary | ICD-10-CM

## 2021-01-13 DIAGNOSIS — E1159 Type 2 diabetes mellitus with other circulatory complications: Secondary | ICD-10-CM

## 2021-02-06 ENCOUNTER — Ambulatory Visit: Payer: BC Managed Care – PPO | Admitting: Physician Assistant

## 2021-02-06 ENCOUNTER — Encounter: Payer: Self-pay | Admitting: Physician Assistant

## 2021-02-06 ENCOUNTER — Other Ambulatory Visit: Payer: Self-pay

## 2021-02-06 VITALS — BP 124/78 | HR 68 | Temp 98.1°F | Ht 71.0 in | Wt 229.0 lb

## 2021-02-06 DIAGNOSIS — Z23 Encounter for immunization: Secondary | ICD-10-CM

## 2021-02-06 DIAGNOSIS — F4323 Adjustment disorder with mixed anxiety and depressed mood: Secondary | ICD-10-CM

## 2021-02-06 DIAGNOSIS — E559 Vitamin D deficiency, unspecified: Secondary | ICD-10-CM | POA: Diagnosis not present

## 2021-02-06 DIAGNOSIS — E538 Deficiency of other specified B group vitamins: Secondary | ICD-10-CM

## 2021-02-06 DIAGNOSIS — E1159 Type 2 diabetes mellitus with other circulatory complications: Secondary | ICD-10-CM

## 2021-02-06 DIAGNOSIS — I152 Hypertension secondary to endocrine disorders: Secondary | ICD-10-CM

## 2021-02-06 DIAGNOSIS — R112 Nausea with vomiting, unspecified: Secondary | ICD-10-CM

## 2021-02-06 DIAGNOSIS — D696 Thrombocytopenia, unspecified: Secondary | ICD-10-CM | POA: Diagnosis not present

## 2021-02-06 DIAGNOSIS — E1169 Type 2 diabetes mellitus with other specified complication: Secondary | ICD-10-CM

## 2021-02-06 DIAGNOSIS — E1142 Type 2 diabetes mellitus with diabetic polyneuropathy: Secondary | ICD-10-CM

## 2021-02-06 LAB — POCT GLYCOSYLATED HEMOGLOBIN (HGB A1C): Hemoglobin A1C: 6.6 % — AB (ref 4.0–5.6)

## 2021-02-06 MED ORDER — GABAPENTIN 600 MG PO TABS: ORAL_TABLET | ORAL | 0 refills | Status: DC

## 2021-02-06 MED ORDER — VITAMIN D (ERGOCALCIFEROL) 1.25 MG (50000 UNIT) PO CAPS: 50000.0000 [IU] | ORAL_CAPSULE | ORAL | 0 refills | Status: DC

## 2021-02-06 MED ORDER — ONDANSETRON HCL 4 MG PO TABS: ORAL_TABLET | ORAL | 0 refills | Status: DC

## 2021-02-06 NOTE — Patient Instructions (Signed)

## 2021-02-06 NOTE — Progress Notes (Signed)
Established Patient Office Visit  Subjective:  Patient ID: [name], male    DOB: [birth year]  Age: 55 y.o. MRN: [id number]  CC:  Chief Complaint  Patient presents with   Follow-up   Diabetes   Hypertension   Hyperlipidemia    HPI [name] presents for follow up on diabetes mellitus, hypertension and hyperlipidemia. Patient having a difficult time adjusting to staying home. Reports is not working and his son uses his car for school so does not have transportation to get around as used to.  Diabetes: Pt denies increased urination or thirst. Pt reports medication compliance. No hypoglycemic events. Has not been checking glucose at home. Reports eating small meals due to chronic nausea. States Reglan did not help with nausea but Zofran did.   HTN: Pt denies chest pain, palpitations, dizziness. States lower extremity swelling has been stable. Taking medication as directed without side effects.    Neuropathy: Taking medication as directed. Medication helps keep neuropathic pain tolerable but some days can be tough.    Past Medical History:  Diagnosis Date   Cirrhosis ([place])    Colon polyps    Diabetes mellitus without complication ([place])    Esophageal candidiasis ([place])    GERD (gastroesophageal reflux disease)    as a child, some as an adult   Hypertension    Sleep apnea    does not use Cpap   Thrombocytopenia ([name])    Wears glasses     Past Surgical History:  Procedure Laterality Date   APPENDECTOMY     BACK SURGERY     COLONOSCOPY     COLONOSCOPY WITH PROPOFOL N/A 07/02/2019   Procedure: COLONOSCOPY WITH PROPOFOL;  Surgeon: Irving Copas., MD;  Location[facility] ENDOSCOPY;  Service: Gastroenterology;  Laterality: N/A;   COLONOSCOPY WITH PROPOFOL N/A 09/17/2019   Procedure: COLONOSCOPY WITH PROPOFOL;  Surgeon: Rush Landmark Telford Nab., MD;  Location: [place];  Service: Gastroenterology;  Laterality: N/A;   ENDOSCOPIC MUCOSAL RESECTION N/A 09/17/2019    Procedure: ENDOSCOPIC MUCOSAL RESECTION;  Surgeon: Rush Landmark Telford Nab., MD;  Location: [place];  Service: Gastroenterology;  Laterality: N/A;   HEMOSTASIS CLIP PLACEMENT  09/17/2019   Procedure: HEMOSTASIS CLIP PLACEMENT;  Surgeon: Irving Copas., MD;  Location: [place];  Service: Gastroenterology;;   IR ANGIOGRAM EXTREMITY LEFT  07/24/2020   IR RADIOLOGIST EVAL & MGMT  07/17/2020   IR TIB-PERO ART ATHEREC INC PTA MOD SED  07/24/2020   IR US GUIDE VASC ACCESS LEFT  07/24/2020   SUBMUCOSAL LIFTING INJECTION  09/17/2019   Procedure: SUBMUCOSAL LIFTING INJECTION;  Surgeon: Irving Copas., MD;  Location: [facility] ENDOSCOPY;  Service: Gastroenterology;;    Family History  Problem Relation Age of Onset   Brain cancer Mother    Diabetes Father    Breast cancer Sister    Colon cancer Neg Hx    Liver cancer Neg Hx    Esophageal cancer Neg Hx    Inflammatory bowel disease Neg Hx    Liver disease Neg Hx    Pancreatic cancer Neg Hx    Rectal cancer Neg Hx    Stomach cancer Neg Hx    Colon polyps Neg Hx     Social History   Socioeconomic History   Marital status: Married    Spouse name: Not on file   Number of children: 1   Years of education: Not on file   Highest education level: Not on file  Occupational History   Occupation: unemployed  Tobacco Use   Smoking status: Former    Types: Cigarettes    Quit date: 06/23/1995    Years since quitting: 25.6   Smokeless tobacco: Current    Types: Chew  Vaping Use   Vaping Use: Never used  Substance and Sexual Activity   Alcohol use: Yes    Comment: 3 drinks a month   Drug use: Yes    Types: Marijuana   Sexual activity: Yes    Birth control/protection: None  Other Topics Concern   Not on file  Social History Narrative   Not on file   Social Determinants of Health   Financial Resource Strain: Not on file  Food Insecurity: Not on file  Transportation Needs: Not on file  Physical Activity: Not on file   Stress: Not on file  Social Connections: Not on file  Intimate Partner Violence: Not on file    Outpatient Medications Prior to Visit  Medication Sig Dispense Refill   atorvastatin (LIPITOR) 20 MG tablet Take 1 tablet (20 mg total) by mouth at bedtime. 90 tablet 1   blood glucose meter kit and supplies Dispense based on patient and insurance preference. Use up to four times daily as directed. (FOR ICD-10 E10.9, E11.9). Use to check fasting blood sugar and 2 hrs after largest meal. 1 each 0   doxycycline (VIBRA-TABS) 100 MG tablet Take 1 tablet (100 mg total) by mouth 2 (two) times daily. 20 tablet 0   Dulaglutide (TRULICITY) 7.59 FM/3.8GY SOPN Inject 0.75 mg into the skin once a week. 2 mL 3   furosemide (LASIX) 20 MG tablet Take 1 tablet (20 mg total) by mouth daily as needed for edema. 90 tablet 0   ibuprofen (ADVIL) 800 MG tablet Take 1 tablet (800 mg total) by mouth every 6 (six) hours as needed. 60 tablet 1   losartan (COZAAR) 50 MG tablet Take 1 tablet (50 mg total) by mouth daily. **PLEASE CONTACT OUR OFFICE TO SCHEDULE A FOLLOW UP FOR FUTURE MED REFILLS** 30 tablet 0   metoCLOPramide (REGLAN) 10 MG tablet Take 1 tablet (10 mg total) by mouth 3 (three) times daily before meals. 270 tablet 0   gabapentin (NEURONTIN) 600 MG tablet TAKE 2 TABLETS BY MOUTH IN THE MORNING AND 2 TABLETS IN THE AFTERNOON AND 3 TABLETS NIGHTLY 630 tablet 0   ondansetron (ZOFRAN) 4 MG tablet TAKE 1 TABLET BY MOUTH EVERY 8 HOURS AS NEEDED FOR NAUSEA FOR VOMITING 18 tablet 0   Vitamin D, Ergocalciferol, (DRISDOL) 1.25 MG (50000 UNIT) CAPS capsule Take 1 capsule (50,000 Units total) by mouth every 7 (seven) days. 12 capsule 0   linaclotide (LINZESS) 72 MCG capsule Take 1 capsule (72 mcg total) by mouth daily before breakfast. 90 capsule 0   oxyCODONE-acetaminophen (PERCOCET) 5-325 MG tablet Take 1-2 tablets by mouth every 4 (four) hours as needed for severe pain. 30 tablet 0   polyethylene glycol (MIRALAX) 17 g  packet Take 17 g by mouth daily as needed for moderate constipation. Increase as needed     No facility-administered medications prior to visit.    No Known Allergies  ROS Review of Systems A fourteen system review of systems was performed and found to be positive as per HPI.   Objective:    Physical Exam General:  Well Developed, well nourished, appropriate for stated age.  Neuro:  Alert and oriented,  extra-ocular muscles intact  HEENT:  Normocephalic, atraumatic, neck supple Skin:  no gross rash, warm, pink. Cardiac:  RRR,  S1 S2 Respiratory:  CTA B/L, Not using accessory muscles, speaking in full sentences- unlabored. Vascular:  Ext warm, no cyanosis apprec.; cap RF less 2 sec. No pitting edema Psych:  No HI/SI, judgement and insight good, Euthymic mood. Full Affect.  BP 124/78   Pulse 68   Temp 98.1 F (36.7 C)   Ht '5\' 11"'  (1.803 m)   Wt 229 lb (103.9 kg)   SpO2 100%   BMI 31.94 kg/m  Wt Readings from Last 3 Encounters:  02/06/21 229 lb (103.9 kg)  09/15/20 228 lb (103.4 kg)  07/24/20 225 lb 8.5 oz (102.3 kg)     Health Maintenance Due  Topic Date Due   OPHTHALMOLOGY EXAM  Never done   HIV Screening  Never done   TETANUS/TDAP  Never done   Zoster Vaccines- Shingrix (1 of 2) Never done   COVID-19 Vaccine (3 - Moderna risk series) 09/25/2019    There are no preventive care reminders to display for this patient.  Lab Results  Component Value Date   TSH 0.628 09/09/2020   Lab Results  Component Value Date   WBC 7.8 02/06/2021   HGB 13.2 02/06/2021   HCT 36.3 (L) 02/06/2021   MCV 90 02/06/2021   PLT CANCELED 02/06/2021   Lab Results  Component Value Date   NA 137 02/06/2021   K 4.7 02/06/2021   CO2 26 02/06/2021   GLUCOSE 158 (H) 02/06/2021   BUN 5 (L) 02/06/2021   CREATININE 1.04 02/06/2021   BILITOT 0.6 02/06/2021   ALKPHOS 167 (H) 02/06/2021   AST 28 02/06/2021   ALT 21 02/06/2021   PROT 7.4 02/06/2021   ALBUMIN 3.4 (L) 02/06/2021    CALCIUM 9.4 02/06/2021   ANIONGAP 6 07/24/2020   EGFR 86 02/06/2021   GFR 102.34 05/11/2019   Lab Results  Component Value Date   CHOL 107 09/09/2020   Lab Results  Component Value Date   HDL 28 (L) 09/09/2020   Lab Results  Component Value Date   LDLCALC 63 09/09/2020   Lab Results  Component Value Date   TRIG 77 09/09/2020   Lab Results  Component Value Date   CHOLHDL 3.8 09/09/2020   Lab Results  Component Value Date   HGBA1C 6.6 (A) 02/06/2021      Assessment & Plan:   Problem List Items Addressed This Visit       Cardiovascular and Mediastinum   Hypertension associated with diabetes ([place]) (Chronic)     Digestive   Nausea with vomiting   Relevant Medications   ondansetron (ZOFRAN) 4 MG tablet     Endocrine   Diabetes mellitus ([name]) - Primary (Chronic)   Relevant Orders   POCT glycosylated hemoglobin (Hb A1C) (Completed)   Comp Met (CMET) (Completed)   CBC w/Diff (Completed)   Diabetic peripheral neuropathy associated with type 2 diabetes mellitus ([name])   Relevant Medications   gabapentin (NEURONTIN) 600 MG tablet     Other   Vitamin D deficiency (Chronic)   Relevant Medications   Vitamin D, Ergocalciferol, (DRISDOL) 1.25 MG (50000 UNIT) CAPS capsule   Other Relevant Orders   Vitamin D (25 hydroxy) (Completed)   Other Visit Diagnoses     Anemia with low platelet count ([place])       Relevant Orders   Ambulatory referral to Hematology / Oncology   CBC w/Diff (Completed)   Iron, TIBC and Ferritin Panel (Completed)   B12 and Folate Panel (Completed)   Adjustment disorder with mixed anxiety  and depressed mood       Relevant Orders   Ambulatory referral to Psychology   Need for influenza vaccination       Relevant Orders   Flu Vaccine QUAD 80moIM (Fluarix, Fluzone & Alfiuria Quad PF) (Completed)      Diabetes Mellitus: -Stable. A1c 6.6 unchanged.  -Continue current medication regimen. -Will continue to monitor.  Hypertension associated  with diabetes mellitus: -BP elevated, BP repeated and improved.  -Continue current medication regimen. -Will collect CMP for medication monitoring.  Diabetic peripheral neuropathy associated with type 2 diabetes mellitus: -Stable. -Continue current medication regimen.  -Will continue to monitor.  Adjustment disorder with mixed anxiety and depressed mood: -PHQ-9 score of 16, denies SI/HI. Discussed with patient management options [facility] considering medication therapy. Referral placed. -Will continue to monitor.  Anemia with low platelet count: -Will place referral to hematology. Will collect CBC, iron panel and Vitamin b12/folate.  Nausea and vomiting: -Likely secondary to gastroparesis.  -Continue ondansetron as needed for nausea. -Recommend to follow up with gastroenterology.  Vitamin D deficiency: -Last Vitamin D 35.6, will repeat Vitamin D. Pending lab results will adjust treatment plan if indicated. Continue Vitamin D supplement.    Meds ordered this encounter  Medications   ondansetron (ZOFRAN) 4 MG tablet    Sig: TAKE 1 TABLET BY MOUTH EVERY 8 HOURS AS NEEDED FOR NAUSEA FOR VOMITING    Dispense:  30 tablet    Refill:  0    Order Specific Question:   Supervising Provider    Answer:   MBeatrice LecherD [[id number]]   Vitamin D, Ergocalciferol, (DRISDOL) 1.25 MG (50000 UNIT) CAPS capsule    Sig: Take 1 capsule (50,000 Units total) by mouth every 7 (seven) days.    Dispense:  12 capsule    Refill:  0    Order Specific Question:   Supervising Provider    Answer:   MBeatrice LecherD [[id number]]   gabapentin (NEURONTIN) 600 MG tablet    Sig: TAKE 2 TABLETS BY MOUTH IN THE MORNING AND 2 TABLETS IN THE AFTERNOON AND 3 TABLETS NIGHTLY    Dispense:  630 tablet    Refill:  0    Order Specific Question:   Supervising Provider    Answer:   MBeatrice LecherD [[id number]]    Follow-up: Return in about 3 months (around 05/09/2021) for CPE and FBW a few days  prior .    MLorrene Reid PA-C

## 2021-02-07 LAB — COMPREHENSIVE METABOLIC PANEL
ALT: 21 IU/L (ref 0–44)
AST: 28 IU/L (ref 0–40)
Albumin/Globulin Ratio: 0.9 — ABNORMAL LOW (ref 1.2–2.2)
Albumin: 3.4 g/dL — ABNORMAL LOW (ref 3.8–4.9)
Alkaline Phosphatase: 167 IU/L — ABNORMAL HIGH (ref 44–121)
BUN/Creatinine Ratio: 5 — ABNORMAL LOW (ref 9–20)
BUN: 5 mg/dL — ABNORMAL LOW (ref 6–24)
Bilirubin Total: 0.6 mg/dL (ref 0.0–1.2)
CO2: 26 mmol/L (ref 20–29)
Calcium: 9.4 mg/dL (ref 8.7–10.2)
Chloride: 99 mmol/L (ref 96–106)
Creatinine, Ser: 1.04 mg/dL (ref 0.76–1.27)
Globulin, Total: 4 g/dL (ref 1.5–4.5)
Glucose: 158 mg/dL — ABNORMAL HIGH (ref 70–99)
Potassium: 4.7 mmol/L (ref 3.5–5.2)
Sodium: 137 mmol/L (ref 134–144)
Total Protein: 7.4 g/dL (ref 6.0–8.5)
eGFR: 86 mL/min/{1.73_m2} (ref >59)

## 2021-02-07 LAB — CBC WITH DIFFERENTIAL/PLATELET
Basophils Absolute: 0 10*3/uL (ref 0.0–0.2)
Basos: 1 %
EOS (ABSOLUTE): 0.3 10*3/uL (ref 0.0–0.4)
Eos: 4 %
Hematocrit: 36.3 % — ABNORMAL LOW (ref 37.5–51.0)
Hemoglobin: 13.2 g/dL (ref 13.0–17.7)
Immature Grans (Abs): 0 10*3/uL (ref 0.0–0.1)
Immature Granulocytes: 0 %
Lymphocytes Absolute: 1.1 10*3/uL (ref 0.7–3.1)
Lymphs: 14 %
MCH: 32.8 pg (ref 26.6–33.0)
MCHC: 36.4 g/dL — ABNORMAL HIGH (ref 31.5–35.7)
MCV: 90 fL (ref 79–97)
Monocytes Absolute: 0.5 10*3/uL (ref 0.1–0.9)
Monocytes: 7 %
Neutrophils Absolute: 5.8 10*3/uL (ref 1.4–7.0)
Neutrophils: 74 %
RBC: 4.02 x10E6/uL — ABNORMAL LOW (ref 4.14–5.80)
RDW: 11.5 % — ABNORMAL LOW (ref 11.6–15.4)
WBC: 7.8 10*3/uL (ref 3.4–10.8)

## 2021-02-07 LAB — IRON,TIBC AND FERRITIN PANEL
Ferritin: 82 ng/mL (ref 30–400)
Iron Saturation: 21 % (ref 15–55)
Iron: 57 ug/dL (ref 38–169)
Total Iron Binding Capacity: 275 ug/dL (ref 250–450)
UIBC: 218 ug/dL (ref 111–343)

## 2021-02-07 LAB — B12 AND FOLATE PANEL
Folate: 2.2 ng/mL — ABNORMAL LOW (ref >3.0)
Vitamin B-12: 429 pg/mL (ref 232–1245)

## 2021-02-07 LAB — VITAMIN D 25 HYDROXY (VIT D DEFICIENCY, FRACTURES): Vit D, 25-Hydroxy: 67.4 ng/mL (ref 30.0–100.0)

## 2021-02-09 MED ORDER — FOLIC ACID 1 MG PO TABS: 1.0000 mg | ORAL_TABLET | Freq: Every day | ORAL | 0 refills | Status: DC

## 2021-02-09 NOTE — Addendum Note (Signed)
Addended by: Lorrene Reid on: 02/09/2021 05:06 PM   Modules accepted: Orders

## 2021-02-10 ENCOUNTER — Telehealth: Payer: Self-pay | Admitting: Hematology and Oncology

## 2021-02-10 NOTE — Telephone Encounter (Signed)
Scheduled appt per 10/7 referral. Pt is aware of appt date and time.

## 2021-02-16 ENCOUNTER — Other Ambulatory Visit: Payer: Self-pay | Admitting: Orthopaedic Surgery

## 2021-02-16 DIAGNOSIS — M25512 Pain in left shoulder: Secondary | ICD-10-CM

## 2021-02-17 ENCOUNTER — Ambulatory Visit: Payer: BC Managed Care – PPO | Admitting: Hematology and Oncology

## 2021-02-17 NOTE — Assessment & Plan Note (Deleted)
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  Previously we reviewed other reasons for thrombocytopenia and ruled out any effective medications or other bone marrow factors or splenomegaly or alcohol or other toxins.  His platelets are very stable and can be watched and monitored and return back to see [place] if he drops his platelets below 50.

## 2021-02-23 ENCOUNTER — Telehealth: Payer: Self-pay

## 2021-02-23 NOTE — Telephone Encounter (Signed)
Colon EMR with endorotor /full thickness resection

## 2021-02-23 NOTE — Telephone Encounter (Signed)
-----   Message from Irving Copas., MD sent at 02/23/2021  2:24 PM EDT ----- Regarding: Follow-up colonoscopy Shaana Acocella, This patient underwent colonoscopy last year but has not scheduled his follow-up. Can you please ensure that a recall was in the system and if it was by was as to what happened with that? Otherwise patient does need to come in for colonoscopy, and should be scheduled as a colonoscopy with EMR with Endorotor/full-thickness resection available. Please update Dr. Havery Moros and I do when he is scheduled. Thanks. GM

## 2021-02-24 NOTE — Telephone Encounter (Signed)
Left message on machine to call back  

## 2021-02-24 NOTE — Telephone Encounter (Signed)
Patty, As discussed this morning hopefully there should be an opening and you can offer him 1 of those dates. Thank you. GM

## 2021-02-24 NOTE — Telephone Encounter (Signed)
Dr Rush Landmark you have no appts left this year for hospital cases.  We donot have the January schedule out yet.  Is it ok for this pt to wait until January?

## 2021-02-25 NOTE — Telephone Encounter (Signed)
Left message on machine to call back  

## 2021-02-26 NOTE — Telephone Encounter (Signed)
Patient returned the call 

## 2021-02-26 NOTE — Telephone Encounter (Signed)
Left message on machine to call back  I have been unable to reach the pt by phone.  I will mail a letter to the pt to return call to set up procedure.

## 2021-02-27 ENCOUNTER — Other Ambulatory Visit: Payer: Self-pay

## 2021-02-27 DIAGNOSIS — Z86010 Personal history of colonic polyps: Secondary | ICD-10-CM

## 2021-02-27 MED ORDER — PEG 3350-KCL-NA BICARB-NACL 420 G PO SOLR: 4000.0000 mL | Freq: Once | ORAL | 0 refills | Status: AC

## 2021-02-27 NOTE — Telephone Encounter (Signed)
The pt has been scheduled for colon EMR on 04/13/21 at 9 am at [facility] with GM.  Colon scheduled, pt instructed and medications reviewed.  Patient instructions mailed to home and sent via My Chart.  Patient to call with any questions or concerns.

## 2021-03-17 ENCOUNTER — Other Ambulatory Visit: Payer: Self-pay | Admitting: Physician Assistant

## 2021-03-17 DIAGNOSIS — E782 Mixed hyperlipidemia: Secondary | ICD-10-CM

## 2021-03-17 DIAGNOSIS — E559 Vitamin D deficiency, unspecified: Secondary | ICD-10-CM

## 2021-03-17 DIAGNOSIS — E1169 Type 2 diabetes mellitus with other specified complication: Secondary | ICD-10-CM

## 2021-03-17 DIAGNOSIS — M7989 Other specified soft tissue disorders: Secondary | ICD-10-CM

## 2021-03-17 MED ORDER — CLOPIDOGREL BISULFATE 75 MG PO TABS: 75.0000 mg | ORAL_TABLET | Freq: Every day | ORAL | 3 refills | Status: DC

## 2021-04-01 ENCOUNTER — Encounter (HOSPITAL_COMMUNITY): Payer: Self-pay | Admitting: Gastroenterology

## 2021-04-01 NOTE — Progress Notes (Signed)
Attempted to obtain medical history via telephone, unable to reach at this time. Unable to leave voicemail to return pre surgical testing department's phone call.   ?

## 2021-04-13 ENCOUNTER — Ambulatory Visit (HOSPITAL_COMMUNITY)
Admission: RE | Admit: 2021-04-13 | Payer: BC Managed Care – PPO | Source: Ambulatory Visit | Admitting: Gastroenterology

## 2021-04-13 ENCOUNTER — Telehealth: Payer: Self-pay

## 2021-04-13 ENCOUNTER — Encounter (HOSPITAL_COMMUNITY): Admission: RE | Payer: Self-pay | Source: Ambulatory Visit

## 2021-04-13 ENCOUNTER — Encounter (HOSPITAL_COMMUNITY): Payer: Self-pay | Admitting: Anesthesiology

## 2021-04-13 DIAGNOSIS — Z86010 Personal history of colonic polyps: Secondary | ICD-10-CM

## 2021-04-13 SURGERY — COLONOSCOPY WITH PROPOFOL
Anesthesia: Monitor Anesthesia Care

## 2021-04-13 NOTE — Telephone Encounter (Signed)
-----   Message from Irving Copas., MD sent at 04/13/2021  9:00 AM EST ----- Regarding: Follow-up [name], This patient did not show up for the procedure today. They called on Sunday and were asking about the time of arrival for his procedure. Not clear that he ever got follow-up on that. However all of the messaging and information is on my chart.  And I believe you had also spoken with him previously as well. Please schedule this patient for next colonoscopy with EMR 90-minute slot Endorotor available that I have. Also, please confirm that the preparation he has is the preparation that he did for my last colonoscopy attempt. Thanks.  Ardine Bjork about your patient.  GM

## 2021-04-13 NOTE — Anesthesia Preprocedure Evaluation (Deleted)
Anesthesia Evaluation    Reviewed: Allergy & Precautions, H&P , Patient's Chart, lab work & pertinent test results  Airway        Dental   Pulmonary sleep apnea and Continuous Positive Airway Pressure Ventilation , former smoker,           Cardiovascular hypertension, Pt. on medications + Peripheral Vascular Disease       Neuro/Psych negative neurological ROS  negative psych ROS   GI/Hepatic Neg liver ROS, hiatal hernia, GERD  Medicated,  Endo/Other  diabetes, Type 2, Oral Hypoglycemic Agents  Renal/GU negative Renal ROS  negative genitourinary   Musculoskeletal   Abdominal   Peds  Hematology negative hematology ROS (+)   Anesthesia Other Findings   Reproductive/Obstetrics negative OB ROS                             Anesthesia Physical  Anesthesia Plan  ASA: 3  Anesthesia Plan: MAC   Post-op Pain Management:    Induction: Intravenous  PONV Risk Score and Plan: 1 and Propofol infusion  Airway Management Planned: Simple Face Mask  Additional Equipment:   Intra-op Plan:   Post-operative Plan:   Informed Consent:   Plan Discussed with: CRNA and Anesthesiologist  Anesthesia Plan Comments:         Anesthesia Quick Evaluation

## 2021-04-14 ENCOUNTER — Telehealth: Payer: Self-pay

## 2021-04-14 MED ORDER — PEG 3350-KCL-NA BICARB-NACL 420 G PO SOLR: 4000.0000 mL | Freq: Once | ORAL | 0 refills | Status: AC

## 2021-04-14 NOTE — Telephone Encounter (Signed)
The pt has been advised to stop his plavix 5 days prior to his Feb procedure.  I have also sent this to his My Chart

## 2021-04-14 NOTE — Telephone Encounter (Signed)
Left message on machine to call back  

## 2021-04-14 NOTE — Telephone Encounter (Signed)
The pt has been rescheduled for colon EMR on 06/22/21 at [facility] with GM.  The pt has been advised and new instructions have been mailed and sent to My Chart.  Plavix is currently on the med list will send a letter to prescriber for ok to hold.

## 2021-04-14 NOTE — Telephone Encounter (Signed)
-----   Message from Joaquim Nam, PA-C sent at 04/14/2021  2:10 PM EST ----- Hi [name],  This patient may stop his Plavix 5-7 days prior to the endoscopic procedure and he may resume it once the GI provider feels it is safe depending on the outcome of the procedure.   Please let me know if there is anything else you need from me.  Thanks, Larene Beach ----- Message ----- From: Timothy Lasso, RN Sent: 04/14/2021  11:21 AM EST To: Joaquim Nam, PA-C, Minette Brine, FNP, #

## 2021-04-28 NOTE — Telephone Encounter (Signed)
Hi, I have never seen this patient and it has Lorrene Reid, PA as his primary provider  Kindest Regards,   Minette Brine, DNP, FNP-BC

## 2021-04-28 NOTE — Telephone Encounter (Signed)
04/14/2021   RE: VEDANT SHEHADEH DOB: [birth year] MRN: [id number]    We have scheduled the above patient for an endoscopic procedure. Our records show that he is on anticoagulation therapy.   Please advise as to how long the patient may come off his therapy of Plavix prior to the procedure, which is scheduled for 06/22/21.  Please fax back/ or route  to Koren Shiver RN  at [phone].   Sincerely,    Timothy Lasso

## 2021-04-28 NOTE — Telephone Encounter (Signed)
The pt has been advised to hold plavix 5 days prior to his upcoming procedure.

## 2021-04-28 NOTE — Telephone Encounter (Signed)
Please see the letter in regards to holding Plavix for upcoming procedure.  Thank you .

## 2021-05-01 ENCOUNTER — Other Ambulatory Visit: Payer: Self-pay

## 2021-05-01 DIAGNOSIS — R112 Nausea with vomiting, unspecified: Secondary | ICD-10-CM

## 2021-05-01 MED ORDER — ONDANSETRON HCL 4 MG PO TABS: ORAL_TABLET | ORAL | 0 refills | Status: DC

## 2021-05-15 ENCOUNTER — Other Ambulatory Visit: Payer: Self-pay

## 2021-05-15 ENCOUNTER — Encounter: Payer: Self-pay | Admitting: Physician Assistant

## 2021-05-15 ENCOUNTER — Ambulatory Visit (INDEPENDENT_AMBULATORY_CARE_PROVIDER_SITE_OTHER): Payer: BC Managed Care – PPO | Admitting: Physician Assistant

## 2021-05-15 VITALS — BP 138/70 | HR 85 | Temp 97.0°F | Ht 71.0 in | Wt 230.1 lb

## 2021-05-15 DIAGNOSIS — I152 Hypertension secondary to endocrine disorders: Secondary | ICD-10-CM

## 2021-05-15 DIAGNOSIS — E559 Vitamin D deficiency, unspecified: Secondary | ICD-10-CM | POA: Diagnosis not present

## 2021-05-15 DIAGNOSIS — E1159 Type 2 diabetes mellitus with other circulatory complications: Secondary | ICD-10-CM | POA: Diagnosis not present

## 2021-05-15 DIAGNOSIS — E1169 Type 2 diabetes mellitus with other specified complication: Secondary | ICD-10-CM | POA: Diagnosis not present

## 2021-05-15 DIAGNOSIS — Z0000 Encounter for general adult medical examination without abnormal findings: Secondary | ICD-10-CM | POA: Diagnosis not present

## 2021-05-15 DIAGNOSIS — E782 Mixed hyperlipidemia: Secondary | ICD-10-CM

## 2021-05-15 MED ORDER — FREESTYLE LIBRE 3 SENSOR MISC: 1.0000 | 3 refills | Status: DC

## 2021-05-15 NOTE — Progress Notes (Signed)
Medication Samples have been provided to the patient.  Drug name: Freestyle Libre 3    Strength:         Qty: 1  LOT: [id number]  Exp.Date: 08-30-2021   The patient has been instructed regarding the correct time, dose, and frequency of taking this medication, including desired effects and most common side effects.   Lorrene Reid 12:47 PM 05/15/2021     Male physical   Impression and Recommendations:    1. Healthcare maintenance   2. Type 2 diabetes mellitus with other specified complication, without long-term current use of insulin ([place])   3. Hypertension associated with diabetes ([place])   4. Mixed diabetic hyperlipidemia associated with type 2 diabetes mellitus ([place])   5. Vitamin D deficiency      1) Anticipatory Guidance: Discussed skin CA prevention and sunscreen when outside along with skin surveillance; eating a balanced and modest diet; physical activity at least 25 minutes per day or minimum of 150 min/ week moderate to intense activity.  2) Immunizations / Screenings / Labs:   All immunizations are up-to-date per recommendations or will be updated today if pt allows.    - Patient understands with dental and vision screens they will schedule independently.  - Will obtain CBC, CMP, HgA1c, TSH, direct LDL (pt non-fasting) and Vit D. Pending Vit D will determine if additional therapy is needed with once a week Vit D. - Pt deferred immunizations at this time. - Scheduled for colonoscopy 06/22/21.  3) Weight: Recommend to continue to improve diet habits to improve overall feelings of well being and objective health data. Improve nutrient density of diet through increasing intake of fruits and vegetables and decreasing saturated fats, white flour products and refined sugars.   4) Healthcare Maintenance: -Recommend to get established with dermatology. -BP stable. -Follow up in 4 months for DM, HTN, HLD   Orders Placed This Encounter  Procedures   CBC   Comprehensive  metabolic panel    Order Specific Question:   Has the patient fasted?    Answer:   Yes   TSH   Hemoglobin A1c   VITAMIN D 25 Hydroxy (Vit-D Deficiency, Fractures)   Direct LDL    Meds ordered this encounter  Medications   Continuous Blood Gluc Sensor (FREESTYLE LIBRE 3 SENSOR) MISC    Sig: 1 Device by Does not apply route every 14 (fourteen) days. Place 1 sensor on the skin every 14 days. Use to check glucose continuously    Dispense:  2 each    Refill:  3    Order Specific Question:   Supervising Provider    Answer:   Beatrice Lecher D [[id number]]     Return in about 4 months (around 09/12/2021) for DM, HTN, HLD.    Gross side effects, risk and benefits, and alternatives of medications discussed with patient.  Patient is aware that all medications have potential side effects and we are unable to predict every side effect or drug-drug interaction that may occur.  Expresses verbal understanding and consents to current therapy plan and treatment regimen.  Please see AVS handed out to patient at the end of our visit for further patient instructions/ counseling done pertaining to today's office visit.       Subjective:        CC: CPE   HPI: DAIMEN SHOVLIN is a 56 y.o. male who presents to [place] [facility] today for a yearly health maintenance exam.  Health Maintenance Summary  - Reviewed and updated, unless pt declines services.  Last Cologuard or Colonoscopy:   09/17/2019, colonoscopy scheduled next month Tobacco History Reviewed:   yes, former smoker, currently dips  Abdominal Ultrasound:   n/a CT scan for screening lung CA:  n/a Alcohol / drug use:    No concerns, no excessive use / no use Dental Home:  no Eye exams: yes Male history: STD concerns:   none Additional penile/ urinary concerns: none   Additional concerns beyond Health Maintenance issues:  none    Immunization History  Administered Date(s) Administered   Hepb-cpg  05/30/2019, 07/06/2019   Influenza,inj,Quad PF,6+ Mos 01/15/2019, 02/06/2021   Moderna Sars-Covid-2 Vaccination 07/26/2019, 08/28/2019   PPD Test 02/19/2019     Health Maintenance  Topic Date Due   Pneumococcal Vaccine 8-97 Years old (1 - PCV) Never done   OPHTHALMOLOGY EXAM  Never done   HIV Screening  Never done   TETANUS/TDAP  Never done   Zoster Vaccines- Shingrix (1 of 2) Never done   COVID-19 Vaccine (3 - Moderna risk series) 09/25/2019   HEMOGLOBIN A1C  08/07/2021   FOOT EXAM  09/15/2021   COLONOSCOPY (Pts 45-35yr Insurance coverage will need to be confirmed)  09/16/2029   INFLUENZA VACCINE  Completed   Hepatitis C Screening  Completed   HPV VACCINES  Aged Out       Wt Readings from Last 3 Encounters:  05/15/21 230 lb 1.9 oz (104.4 kg)  02/06/21 229 lb (103.9 kg)  09/15/20 228 lb (103.4 kg)   BP Readings from Last 3 Encounters:  05/15/21 138/70  02/06/21 124/78  09/15/20 (!) 151/77   Pulse Readings from Last 3 Encounters:  05/15/21 85  02/06/21 68  09/15/20 86    Patient Active Problem List   Diagnosis Date Noted   Cecal polyp 06/02/2019   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   Abnormal colonoscopy 06/02/2019   Swelling of both lower extremities 05/29/2019   Thrombocytopenia ([name]) 04/03/2019   Peripheral arterial disease ([place] 02/15/2019   Hyperlipidemia 02/15/2019   Persistent proteinuria 11/14/2018   Low TSH level 11/14/2018   High risk medications (not anticoagulants) long-term use 11/14/2018   Noncompliance with diet and medication regimen 11/14/2018   Hiatal hernia 11/14/2018   Anorexia 11/14/2018   Diabetic ulcer of toe of right foot associated with diabetes mellitus due to underlying condition, with fat layer exposed ([place] 07/11/2018   Healthcare maintenance 07/11/2018   Diabetic peripheral neuropathy associated with type 2 diabetes mellitus ([place] 11/16/2017   Mixed diabetic hyperlipidemia associated with type 2 diabetes mellitus ([place]  11/16/2017   Hypertension associated with diabetes ([place] 07/28/2017   Vitamin D deficiency 07/28/2017   Diabetes mellitus ([place] 07/28/2017   H/O noncompliance with medical treatment, presenting hazards to health 07/28/2017   Morbidly obese ([place] 07/28/2017   Essential hypertension, benign 06/22/2012   Pancreatitis, acute 06/22/2012   Nausea with vomiting 06/22/2012   Abdominal pain, epigastric 06/22/2012   Acute hyperkalemia 06/22/2012   Leukocytosis, unspecified 06/22/2012   Diabetic neuropathy, painful ([place] 06/22/2012    Past Medical History:  Diagnosis Date   Cirrhosis ([place]    Colon polyps    Diabetes mellitus without complication ([place]    Esophageal candidiasis ([facility]    GERD (gastroesophageal reflux disease)    as a child, some as an adult   Hypertension    Sleep apnea    does not use Cpap   Thrombocytopenia ([name])    Wears glasses  Past Surgical History:  Procedure Laterality Date   APPENDECTOMY     BACK SURGERY     COLONOSCOPY     COLONOSCOPY WITH PROPOFOL N/A 07/02/2019   Procedure: COLONOSCOPY WITH PROPOFOL;  Surgeon: Rush Landmark Telford Nab., MD;  Location: [place];  Service: Gastroenterology;  Laterality: N/A;   COLONOSCOPY WITH PROPOFOL N/A 09/17/2019   Procedure: COLONOSCOPY WITH PROPOFOL;  Surgeon: Rush Landmark Telford Nab., MD;  Location: [place];  Service: Gastroenterology;  Laterality: N/A;   ENDOSCOPIC MUCOSAL RESECTION N/A 09/17/2019   Procedure: ENDOSCOPIC MUCOSAL RESECTION;  Surgeon: Rush Landmark Telford Nab., MD;  Location: [place];  Service: Gastroenterology;  Laterality: N/A;   HEMOSTASIS CLIP PLACEMENT  09/17/2019   Procedure: HEMOSTASIS CLIP PLACEMENT;  Surgeon: Irving Copas., MD;  Location: [place];  Service: Gastroenterology;;   IR ANGIOGRAM EXTREMITY LEFT  07/24/2020   IR RADIOLOGIST EVAL & MGMT  07/17/2020   IR TIB-PERO ART ATHEREC INC PTA MOD SED  07/24/2020   IR US GUIDE VASC ACCESS LEFT  07/24/2020   SUBMUCOSAL LIFTING  INJECTION  09/17/2019   Procedure: SUBMUCOSAL LIFTING INJECTION;  Surgeon: Irving Copas., MD;  [facility] END[facility]COPY;  Service: Gastroenterology;;    Family History  Problem Relation Age of Onset   Brain cancer Mother    Diabetes Father    Breast cancer Sister    Colon cancer Neg Hx    Liver cancer Neg Hx    Esophageal cancer Neg Hx    Inflammatory bowel disease Neg Hx    Liver disease Neg Hx    Pancreatic cancer Neg Hx    Rectal cancer Neg Hx    Stomach cancer Neg Hx    Colon polyps Neg Hx     Social History   Substance and Sexual Activity  Drug Use Yes   Types: Marijuana  ,  Social History   Substance and Sexual Activity  Alcohol Use Yes   Comment: 3 drinks a month  ,  Social History   Tobacco Use  Smoking Status Former   Types: Cigarettes   Quit date: 06/23/1995   Years since quitting: 25.9  Smokeless Tobacco Current   Types: Chew  ,  Social History   Substance and Sexual Activity  Sexual Activity Yes   Birth control/protection: None    Patient's Medications  New Prescriptions   CONTINUOUS BLOOD GLUC SENSOR (FREESTYLE LIBRE 3 SENSOR) MISC    1 Device by Does not apply route every 14 (fourteen) days. Place 1 sensor on the skin every 14 days. Use to check glucose continuously  Previous Medications   ATORVASTATIN (LIPITOR) 20 MG TABLET    TAKE 1 TABLET BY MOUTH AT BEDTIME   BLOOD GLUCOSE METER KIT AND SUPPLIES    Dispense based on patient and insurance preference. Use up to four times daily as directed. (FOR ICD-10 E10.9, E11.9). Use to check fasting blood sugar and 2 hrs after largest meal.   CLOPIDOGREL (PLAVIX) 75 MG TABLET    Take 1 tablet (75 mg total) by mouth daily.   DULAGLUTIDE (TRULICITY) 1.96 QI/2.9NL SOPN    Inject 0.75 mg into the skin once a week.   FOLIC ACID (FOLVITE) 1 MG TABLET    Take 1 tablet (1 mg total) by mouth daily.   FUROSEMIDE (LASIX) 20 MG TABLET    TAKE 1 TABLET BY MOUTH ONCE DAILY AS NEEDED FOR EDEMA   GABAPENTIN  (NEURONTIN) 600 MG TABLET    TAKE 2 TABLETS BY MOUTH IN THE MORNING AND 2  TABLETS IN THE AFTERNOON AND 3 TABLETS NIGHTLY   IBUPROFEN (ADVIL) 800 MG TABLET    Take 1 tablet (800 mg total) by mouth every 6 (six) hours as needed.   LOSARTAN (COZAAR) 50 MG TABLET    Take 1 tablet (50 mg total) by mouth daily. **PLEASE CONTACT OUR OFFICE TO SCHEDULE A FOLLOW UP FOR FUTURE MED REFILLS**   ONDANSETRON (ZOFRAN) 4 MG TABLET    TAKE 1 TABLET BY MOUTH EVERY 8 HOURS AS NEEDED FOR NAUSEA FOR VOMITING   VITAMIN D, ERGOCALCIFEROL, (DRISDOL) 1.25 MG (50000 UNIT) CAPS CAPSULE    Take 1 capsule (50,000 Units total) by mouth every 7 (seven) days.  Modified Medications   No medications on file  Discontinued Medications   DOXYCYCLINE (VIBRA-TABS) 100 MG TABLET    Take 1 tablet (100 mg total) by mouth 2 (two) times daily.   METOCLOPRAMIDE (REGLAN) 10 MG TABLET    Take 1 tablet (10 mg total) by mouth 3 (three) times daily before meals.    Patient has no known allergies.  Review of Systems: General:   Denies fever, chills, unexplained weight loss.  Optho/Auditory:   Denies visual changes, blurred vision/LOV Respiratory:   Denies SOB, DOE more than baseline levels.   Cardiovascular:   Denies chest pain, palpitations, new onset peripheral edema  Gastrointestinal:   Denies nausea, vomiting, diarrhea.  Genitourinary: Denies dysuria, freq/ urgency, flank pain   Endocrine:     Denies hot or cold intolerance, polyuria, polydipsia. Musculoskeletal:   Denies joint swelling, +arthralgia, +myalgia  Skin:  Denies rash, suspicious lesions Neurological:     Denies dizziness, unexplained weakness, numbness  Psychiatric/Behavioral:   Denies mood changes, suicidal or homicidal ideations, hallucinations    Objective:     Blood pressure 138/70, pulse 85, temperature (!) 97 F (36.1 C), height '5\' 11"'  (1.803 m), weight 230 lb 1.9 oz (104.4 kg), SpO2 100 %. Body mass index is 32.1 kg/m. General Appearance:    Alert,  cooperative, no distress, appears stated age  Head:    Normocephalic, without obvious abnormality, atraumatic  Eyes:    PERRL, conjunctiva/corneas clear, EOM's intact, fundi    benign, both eyes  Ears:    Normal TM's and external ear canals, both ears  Nose:   Nares normal, septum midline, mucosa normal, no drainage    or sinus tenderness  Throat:   Lips w/o lesion, mucosa moist, and tongue normal; teeth and gums fair  Neck:   Supple, symmetrical, trachea midline, no adenopathy;    thyroid:  no enlargement/tenderness/nodules; no JVD  Back:     Kyphosis (mild), no curvature, ROM normal, no CVA tenderness, +back pain  Lungs:     Clear to auscultation bilaterally, respirations unlabored, no  Wh/ R/ R  Chest Wall:    No tenderness or gross deformity; normal excursion   Heart:    Regular rate and rhythm, S1 and S2 normal, no murmur, rub   or gallop  Abdomen:     Soft, non-tender, bowel sounds active all four quadrants, No G/R/R, no masses, no organomegaly  Genitalia:   Deferred.  Rectal:   Deferred.  Extremities:   Extremities normal, atraumatic, no cyanosis ,+ edema  Pulses:   2+ and symmetric all extremities  Skin:   Warm, dry, Skin color, texture, turgor normal, no obvious rashes, multiple skin nodules of LE noted (pt states hereditary)  M-Sk:   Ambulates * 4 w/o difficulty, no gross deformities, tone WNL  Neurologic:   CNII-XII grossly  intact Psych:  No HI/SI, judgement and insight good, Euthymic mood. Full Affect.

## 2021-05-15 NOTE — Patient Instructions (Signed)

## 2021-05-16 LAB — COMPREHENSIVE METABOLIC PANEL
ALT: 27 IU/L (ref 0–44)
AST: 28 IU/L (ref 0–40)
Albumin/Globulin Ratio: 0.9 — ABNORMAL LOW (ref 1.2–2.2)
Albumin: 3.6 g/dL — ABNORMAL LOW (ref 3.8–4.9)
Alkaline Phosphatase: 172 IU/L — ABNORMAL HIGH (ref 44–121)
BUN/Creatinine Ratio: 4 — ABNORMAL LOW (ref 9–20)
BUN: 4 mg/dL — ABNORMAL LOW (ref 6–24)
Bilirubin Total: 0.7 mg/dL (ref 0.0–1.2)
CO2: 25 mmol/L (ref 20–29)
Calcium: 9.1 mg/dL (ref 8.7–10.2)
Chloride: 100 mmol/L (ref 96–106)
Creatinine, Ser: 1.05 mg/dL (ref 0.76–1.27)
Globulin, Total: 3.9 g/dL (ref 1.5–4.5)
Glucose: 209 mg/dL — ABNORMAL HIGH (ref 70–99)
Potassium: 3.7 mmol/L (ref 3.5–5.2)
Sodium: 136 mmol/L (ref 134–144)
Total Protein: 7.5 g/dL (ref 6.0–8.5)
eGFR: 85 mL/min/{1.73_m2} (ref >59)

## 2021-05-16 LAB — CBC
Hematocrit: 36.4 % — ABNORMAL LOW (ref 37.5–51.0)
Hemoglobin: 12.9 g/dL — ABNORMAL LOW (ref 13.0–17.7)
MCH: 31.9 pg (ref 26.6–33.0)
MCHC: 35.4 g/dL (ref 31.5–35.7)
MCV: 90 fL (ref 79–97)
Platelets: 82 10*3/uL — CL (ref 150–450)
RBC: 4.04 x10E6/uL — ABNORMAL LOW (ref 4.14–5.80)
RDW: 11.7 % (ref 11.6–15.4)
WBC: 8.3 10*3/uL (ref 3.4–10.8)

## 2021-05-16 LAB — LDL CHOLESTEROL, DIRECT: LDL Direct: 58 mg/dL (ref 0–99)

## 2021-05-16 LAB — HEMOGLOBIN A1C
Est. average glucose Bld gHb Est-mCnc: 148 mg/dL
Hgb A1c MFr Bld: 6.8 % — ABNORMAL HIGH (ref 4.8–5.6)

## 2021-05-16 LAB — TSH: TSH: 0.892 u[IU]/mL (ref 0.450–4.500)

## 2021-05-16 LAB — VITAMIN D 25 HYDROXY (VIT D DEFICIENCY, FRACTURES): Vit D, 25-Hydroxy: 66.9 ng/mL (ref 30.0–100.0)

## 2021-05-18 ENCOUNTER — Other Ambulatory Visit (HOSPITAL_BASED_OUTPATIENT_CLINIC_OR_DEPARTMENT_OTHER): Payer: Self-pay | Admitting: Physician Assistant

## 2021-05-18 DIAGNOSIS — D696 Thrombocytopenia, unspecified: Secondary | ICD-10-CM

## 2021-05-19 ENCOUNTER — Telehealth: Payer: Self-pay | Admitting: Hematology and Oncology

## 2021-05-19 NOTE — Progress Notes (Signed)
Patient Care Team: Lorrene Reid, PA-C as PCP - General (Physician Assistant) Minette Brine, FNP (General Practice)  DIAGNOSIS:    ICD-10-CM   1. Thrombocytopenia ([name])  D69.6       CHIEF COMPLIANT: Follow-up of anemia and thrombocytopenia  INTERVAL HISTORY: [name] is a 56 y.o. with above-mentioned history of anemia and thrombocytopenia. He presents to the clinic today for follow-up.  He has continued to lose weight.  He lost over 110 pounds in the last year or so because of decreased appetite and difficulty with eating.  He has seen gastroenterology who had performed upper endoscopy and colonoscopy and have not determined any particular reason.  He had an infection which was treated with antibiotics.  ALLERGIES:  has No Known Allergies.  MEDICATIONS:  Current Outpatient Medications  Medication Sig Dispense Refill   atorvastatin (LIPITOR) 20 MG tablet TAKE 1 TABLET BY MOUTH AT BEDTIME 90 tablet 1   blood glucose meter kit and supplies Dispense based on patient and insurance preference. Use up to four times daily as directed. (FOR ICD-10 E10.9, E11.9). Use to check fasting blood sugar and 2 hrs after largest meal. 1 each 0   clopidogrel (PLAVIX) 75 MG tablet Take 1 tablet (75 mg total) by mouth daily. 90 tablet 3   Continuous Blood Gluc Sensor (FREESTYLE LIBRE 3 SENSOR) MISC 1 Device by Does not apply route every 14 (fourteen) days. Place 1 sensor on the skin every 14 days. Use to check glucose continuously 2 each 3   Dulaglutide (TRULICITY) 4.81 EH/6.3JS SOPN Inject 0.75 mg into the skin once a week. (Patient taking differently: Inject 0.75 mg into the skin every Sunday.) 2 mL 3   folic acid (FOLVITE) 1 MG tablet Take 1 tablet (1 mg total) by mouth daily. 90 tablet 0   furosemide (LASIX) 20 MG tablet TAKE 1 TABLET BY MOUTH ONCE DAILY AS NEEDED FOR EDEMA 90 tablet 1   gabapentin (NEURONTIN) 600 MG tablet TAKE 2 TABLETS BY MOUTH IN THE MORNING AND 2 TABLETS IN THE AFTERNOON AND  3 TABLETS NIGHTLY 630 tablet 0   ibuprofen (ADVIL) 800 MG tablet Take 1 tablet (800 mg total) by mouth every 6 (six) hours as needed. 60 tablet 1   losartan (COZAAR) 50 MG tablet Take 1 tablet (50 mg total) by mouth daily. **PLEASE CONTACT OUR OFFICE TO SCHEDULE A FOLLOW UP FOR FUTURE MED REFILLS** 30 tablet 0   ondansetron (ZOFRAN) 4 MG tablet TAKE 1 TABLET BY MOUTH EVERY 8 HOURS AS NEEDED FOR NAUSEA FOR VOMITING 30 tablet 0   Vitamin D, Ergocalciferol, (DRISDOL) 1.25 MG (50000 UNIT) CAPS capsule Take 1 capsule (50,000 Units total) by mouth every 7 (seven) days. (Patient taking differently: Take 50,000 Units by mouth every Sunday.) 12 capsule 0   No current facility-administered medications for this visit.    PHYSICAL EXAMINATION: ECOG PERFORMANCE STATUS: 1 - Symptomatic but completely ambulatory  Vitals:   05/20/21 1019  BP: (!) 146/72  Pulse: 80  Resp: 18  Temp: 97.7 F (36.5 C)  SpO2: 100%   Filed Weights   05/20/21 1019  Weight: 230 lb 7 oz (104.5 kg)    LABORATORY DATA:  I have reviewed the data as listed CMP Latest Ref Rng & Units 05/15/2021 02/06/2021 09/09/2020  Glucose 70 - 99 mg/dL 209(H) 158(H) 174(H)  BUN 6 - 24 mg/dL 4(L) 5(L) 6  Creatinine 0.76 - 1.27 mg/dL 1.05 1.04 0.97  Sodium 134 - 144 mmol/L 136 137 136  Potassium 3.5 - 5.2 mmol/L 3.7 4.7 3.6  Chloride 96 - 106 mmol/L 100 99 100  CO2 20 - 29 mmol/L '25 26 24  ' Calcium 8.7 - 10.2 mg/dL 9.1 9.4 9.2  Total Protein 6.0 - 8.5 g/dL 7.5 7.4 7.0  Total Bilirubin 0.0 - 1.2 mg/dL 0.7 0.6 0.6  Alkaline Phos 44 - 121 IU/L 172(H) 167(H) 167(H)  AST 0 - 40 IU/L '28 28 30  ' ALT 0 - 44 IU/L '27 21 28    ' Lab Results  Component Value Date   WBC 8.3 05/15/2021   HGB 12.9 (L) 05/15/2021   HCT 36.4 (L) 05/15/2021   MCV 90 05/15/2021   PLT 82 (LL) 05/15/2021   NEUTROABS 5.8 02/06/2021    ASSESSMENT & PLAN:  Thrombocytopenia ([name]) Chronic thrombocytopenia Platelet count  08/14/2008: 89 July 2020: 115 01/15/2019: 100   05/15/21: 82  Mild thrombocytopenia: Platelet count 128 on 04/23/2016; 129 on 05/22/2016, 144 in February 2009 Remainder of the CBC with differential was normal, hemoglobin 15.9, WBC 4.8 with normal differential   Differential diagnosis: 1. 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 I will call him with the result of these tests. Unexplained weight loss: Related to upper GI issues.  I instructed him to speak to gastroenterology.  Return to clinic in 6 months with labs and follow-up.   No orders of the defined types were placed in this encounter.  The patient has a good understanding of the overall plan. he agrees with it. he will call with any problems that may develop before the next visit here.  Total time spent: 20 mins including face to face time and time spent for planning, charting and coordination of care  Rulon Eisenmenger, MD, MPH 05/20/2021  I, Thana Ates, am acting as scribe for Dr. Nicholas Lose.  I have reviewed the above documentation for accuracy and completeness, and I agree with the above.

## 2021-05-19 NOTE — Telephone Encounter (Signed)
Scheduled appt per 1/16 referral. Pt was established with Dr. Lindi Adie. I scheduled pt for f/u with MD. Pt is aware of appt date and time. Pt is aware to arrive 15 mins prior to appt time.

## 2021-05-19 NOTE — Assessment & Plan Note (Signed)
Chronic thrombocytopenia Platelet count  08/14/2008: 89 July 2020: 115 01/15/2019: 100 05/15/21: 82  Mild thrombocytopenia: Platelet count 128 on 04/23/2016; 129 on 05/22/2016, 144 in February 2009 Remainder of the CBC with differential was normal, hemoglobin 15.9, WBC 4.8 with normal differential  Differential diagnosis: 1. Low-grade ITP

## 2021-05-20 ENCOUNTER — Other Ambulatory Visit: Payer: Self-pay

## 2021-05-20 ENCOUNTER — Inpatient Hospital Stay: Payer: BC Managed Care – PPO | Attending: Hematology and Oncology | Admitting: Hematology and Oncology

## 2021-05-20 DIAGNOSIS — D649 Anemia, unspecified: Secondary | ICD-10-CM | POA: Diagnosis present

## 2021-05-20 DIAGNOSIS — D696 Thrombocytopenia, unspecified: Secondary | ICD-10-CM | POA: Diagnosis not present

## 2021-05-20 MED ORDER — METOCLOPRAMIDE HCL 5 MG PO TABS: 5.0000 mg | ORAL_TABLET | Freq: Three times a day (TID) | ORAL | 0 refills | Status: DC

## 2021-05-21 ENCOUNTER — Telehealth: Payer: Self-pay | Admitting: Hematology and Oncology

## 2021-05-21 NOTE — Telephone Encounter (Signed)
Scheduled appointment per 01/18 los. Patient is aware.

## 2021-05-26 ENCOUNTER — Other Ambulatory Visit: Payer: Self-pay | Admitting: Physician Assistant

## 2021-05-26 DIAGNOSIS — E1169 Type 2 diabetes mellitus with other specified complication: Secondary | ICD-10-CM

## 2021-05-28 ENCOUNTER — Ambulatory Visit (HOSPITAL_COMMUNITY): Admission: RE | Admit: 2021-05-28 | Payer: BC Managed Care – PPO | Source: Ambulatory Visit

## 2021-06-03 NOTE — Progress Notes (Incomplete)
°  HEMATOLOGY-ONCOLOGY TELEPHONE VISIT PROGRESS NOTE  I connected with [name] on 06/03/2021 at  9:30 AM EST by telephone and verified that I am speaking with the correct person using two identifiers.  I discussed the limitations, risks, security and privacy concerns of performing an evaluation and management service by telephone and the availability of in person appointments.  I also discussed with the patient that there may be a patient responsible charge related to this service. The patient expressed understanding and agreed to proceed.   History of Present Illness: [name] is a 56 y.o. male with above-mentioned history of anemia and thrombocytopenia. He presents to the clinic today for follow-up  Observations/Objective:     Assessment Plan:  No problem-specific Assessment & Plan notes found for this encounter.    I discussed the assessment and treatment plan with the patient. The patient was provided an opportunity to ask questions and all were answered. The patient agreed with the plan and demonstrated an understanding of the instructions. The patient was advised to call back or seek an in-person evaluation if the symptoms worsen or if the condition fails to improve as anticipated.   Total time spent: *** mins including non-face to face time and time spent for planning, charting and coordination of care  Rulon Eisenmenger, MD 06/03/2021    I, Thana Ates, am acting as scribe for Nicholas Lose, MD.  {Add scribe attestation statement}

## 2021-06-04 ENCOUNTER — Telehealth: Payer: Self-pay | Admitting: *Deleted

## 2021-06-04 ENCOUNTER — Inpatient Hospital Stay: Payer: BC Managed Care – PPO | Admitting: Hematology and Oncology

## 2021-06-04 NOTE — Telephone Encounter (Signed)
RN placed call to pt to alert pt f/u MD visit will need to be canceled for today due to pt not having [place] of abdomen completed.  RN informed pt that he had missed his [place] appt last week. RN transferred pt to central scheduling for pt to schedule [place] based on his schedule.  RN encouraged pt to alert our office one appt is scheduled and we will schedule MD f/u.  Pt verbalized understanding.

## 2021-06-04 NOTE — Assessment & Plan Note (Deleted)
Chronic thrombocytopenia Platelet count  08/14/2008: 89 July 2020: 115 01/15/2019: 100 05/15/21: 82  Mild thrombocytopenia: Platelet count 128 on 04/23/2016; 129 on 05/22/2016, 144 in February 2009 Remainder of the CBC with differential was normal, hemoglobin 15.9, WBC 4.8 with normal differential  Differential diagnosis: 1. 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

## 2021-06-09 ENCOUNTER — Other Ambulatory Visit: Payer: Self-pay | Admitting: Physician Assistant

## 2021-06-09 DIAGNOSIS — E559 Vitamin D deficiency, unspecified: Secondary | ICD-10-CM

## 2021-06-12 ENCOUNTER — Encounter (HOSPITAL_COMMUNITY): Payer: Self-pay | Admitting: Gastroenterology

## 2021-06-12 ENCOUNTER — Other Ambulatory Visit: Payer: Self-pay

## 2021-06-15 ENCOUNTER — Other Ambulatory Visit: Payer: Self-pay | Admitting: Physician Assistant

## 2021-06-15 DIAGNOSIS — E1159 Type 2 diabetes mellitus with other circulatory complications: Secondary | ICD-10-CM

## 2021-06-19 ENCOUNTER — Encounter (HOSPITAL_COMMUNITY): Payer: Self-pay | Admitting: Certified Registered Nurse Anesthetist

## 2021-06-20 NOTE — Anesthesia Preprocedure Evaluation (Deleted)
Anesthesia Evaluation    Airway        Dental   Pulmonary former smoker,           Cardiovascular hypertension, Pt. on medications      Neuro/Psych    GI/Hepatic hiatal hernia, GERD  ,(+) Cirrhosis     substance abuse  marijuana use,   Endo/Other  diabetes  Renal/GU      Musculoskeletal   Abdominal   Peds  Hematology   Anesthesia Other Findings   Reproductive/Obstetrics                             Anesthesia Physical Anesthesia Plan  ASA: 3  Anesthesia Plan: MAC   Post-op Pain Management: Minimal or no pain anticipated   Induction:   PONV Risk Score and Plan: 1 and Ondansetron and Treatment may vary due to age or medical condition  Airway Management Planned: Simple Face Mask and Natural Airway  Additional Equipment: None  Intra-op Plan:   Post-operative Plan:   Informed Consent:   Plan Discussed with:   Anesthesia Plan Comments:         Anesthesia Quick Evaluation

## 2021-06-22 ENCOUNTER — Telehealth: Payer: Self-pay

## 2021-06-22 ENCOUNTER — Ambulatory Visit (HOSPITAL_COMMUNITY)
Admission: RE | Admit: 2021-06-22 | Payer: BC Managed Care – PPO | Source: Home / Self Care | Admitting: Gastroenterology

## 2021-06-22 HISTORY — DX: Other complications of anesthesia, initial encounter: T88.59XA

## 2021-06-22 SURGERY — COLONOSCOPY WITH PROPOFOL
Anesthesia: Monitor Anesthesia Care

## 2021-06-22 MED ORDER — PROPOFOL 500 MG/50ML IV EMUL
INTRAVENOUS | Status: AC
  Filled 2021-06-22: qty 50

## 2021-06-22 NOTE — Telephone Encounter (Signed)
3/15 at 1130 am appt made with Dr Rush Landmark  The pt has been advised and states he will keep the appt.

## 2021-06-22 NOTE — Progress Notes (Signed)
Patient did not show up for procedure this am. Called patient and he stated that he could not keep the prep down. He stated he attempted to call [facility] yesterday but there was no message on our voicemail. Pt stated he would call the office to reschedule.

## 2021-06-22 NOTE — Telephone Encounter (Signed)
Mansouraty, Telford Nab., MD  Timothy Lasso, RN; Havery Moros Carlota Raspberry, MD Demond Shallenberger,  I think that is reasonable as well.  GM        Previous Messages   ----- Message -----  From: Timothy Lasso, RN  Sent: 06/22/2021   8:30 AM EST  To: Irving Copas., MD  Subject: RE: Follow-up                                   This is his second no show for a procedure with you.  Should I schedule an office visit before taking another hospital slot?

## 2021-06-22 NOTE — Telephone Encounter (Signed)
-----   Message from Irving Copas., MD sent at 06/22/2021  8:23 AM EST ----- Regarding: Follow-up [name], This patient did not come in for his colonoscopy with EMR today. Supposedly he tried to reach the hospital or our on-call coverage to tell him that he was having issues with tolerating the preparation.  I did not receive any messages of tha nor are there any messages from the hospital team or from the on-call coverage last night. In any case we need to try to see about getting him rescheduled.  However, with his issues with the preparation, I think he may need a previsit to discuss things further.  I am not sure why he is having issues with the preparation this go around.  With that being said even his last colonoscopy required a significant amount of lavage before we could even begin the resection.  Please reach out to him at some point this week and see how he wants to move forward with scheduling things.  FYI SA about our mutual patient missing another colonoscopy.  Thanks. GM

## 2021-07-02 ENCOUNTER — Other Ambulatory Visit: Payer: Self-pay | Admitting: Physician Assistant

## 2021-07-02 DIAGNOSIS — E1169 Type 2 diabetes mellitus with other specified complication: Secondary | ICD-10-CM

## 2021-07-02 DIAGNOSIS — E1142 Type 2 diabetes mellitus with diabetic polyneuropathy: Secondary | ICD-10-CM

## 2021-07-15 ENCOUNTER — Encounter: Payer: Self-pay | Admitting: Gastroenterology

## 2021-07-15 ENCOUNTER — Ambulatory Visit: Payer: BC Managed Care – PPO | Admitting: Gastroenterology

## 2021-07-15 VITALS — BP 120/70 | HR 80 | Ht 70.5 in | Wt 235.0 lb

## 2021-07-15 DIAGNOSIS — D126 Benign neoplasm of colon, unspecified: Secondary | ICD-10-CM | POA: Diagnosis not present

## 2021-07-15 DIAGNOSIS — K5909 Other constipation: Secondary | ICD-10-CM

## 2021-07-15 DIAGNOSIS — R1114 Bilious vomiting: Secondary | ICD-10-CM | POA: Diagnosis not present

## 2021-07-15 DIAGNOSIS — Z86010 Personal history of colonic polyps: Secondary | ICD-10-CM

## 2021-07-15 DIAGNOSIS — D122 Benign neoplasm of ascending colon: Secondary | ICD-10-CM

## 2021-07-15 DIAGNOSIS — Z7902 Long term (current) use of antithrombotics/antiplatelets: Secondary | ICD-10-CM

## 2021-07-15 MED ORDER — SUTAB 1479-225-188 MG PO TABS: 24.0000 | ORAL_TABLET | ORAL | 0 refills | Status: DC

## 2021-07-15 MED ORDER — LINACLOTIDE 145 MCG PO CAPS: 145.0000 ug | ORAL_CAPSULE | Freq: Every day | ORAL | 2 refills | Status: DC

## 2021-07-15 NOTE — Patient Instructions (Signed)
We have sent the following medications to your pharmacy for you to pick up at your convenience: ?[place], [place]  ? ?Increase your Linzess to 132mg - take 1 capsules once daily.  ? ?You have been scheduled for a colonoscopy. Please follow written instructions given to you at your visit today.  ?Please pick up your prep supplies at the pharmacy within the next 1-3 days. ?If you use inhalers (even only as needed), please bring them with you on the day of your procedure. ? ? ?If you are age 3322or younger, your body mass index should be between 19-25. Your Body mass index is 33.24 kg/m?.Marland KitchenIf this is out of the aformentioned range listed, please consider follow up with your Primary Care Provider.  ? ?________________________________________________________ ? ?The [facility] providers would like to encourage you to use [facility] providers for non-urgent requests or questions.  Due to long hold times on the telephone, sending your provider a message by [facility] be a faster and more efficient way to get a response.  Please allow 48 business hours for a response.  Please remember that this is for non-urgent requests.  ? ?Thank you for choosing me and [facility]. ? ?Dr. MRush Landmark? ? ? ?

## 2021-07-16 ENCOUNTER — Telehealth: Payer: Self-pay

## 2021-07-16 ENCOUNTER — Encounter: Payer: Self-pay | Admitting: Gastroenterology

## 2021-07-16 DIAGNOSIS — Z86010 Personal history of colonic polyps: Secondary | ICD-10-CM | POA: Insufficient documentation

## 2021-07-16 DIAGNOSIS — R112 Nausea with vomiting, unspecified: Secondary | ICD-10-CM | POA: Insufficient documentation

## 2021-07-16 DIAGNOSIS — Z7902 Long term (current) use of antithrombotics/antiplatelets: Secondary | ICD-10-CM | POA: Insufficient documentation

## 2021-07-16 NOTE — Telephone Encounter (Signed)
See previous encounter

## 2021-07-16 NOTE — Progress Notes (Signed)
? ?GASTROENTEROLOGY OUTPATIENT CLINIC VISIT  ? ?Primary Care Provider ?Lorrene Reid, PA-C ?[address] ?[place] [address] ?[phone] ? ?Referring Provider ?Dr. Havery Moros ? ?Patient Profile: ?[name] is a 56 y.o. male with a pmh significant for obesity, diabetes, hypertension, PAD (status post idiopathic PTA on Plavix), thrombocytopenia, undifferentiated cirrhosis, colon polyps (large cecal/IC valve TVA).  The patient presents to the [facility] for an evaluation and management of problem(s) noted below: ? ?Problem List ?1. Tubulovillous adenoma of AC/Cecum/ICV   ?2. Hx of adenomatous colonic polyps   ?3. Bilious vomiting with nausea   ?4. Chronic constipation   ?5. Long term current use of antithrombotics/antiplatelets   ? ? ?History of Present Illness ?Please see prior notes for full details of HPI. ? ?Interval History ?The patient underwent colonoscopy with EMR in May 2021.  The polyp was removed in piecemeal fashion.  There was evidence of TVA with high-grade dysplasia foci.  The patient was not able to undergo repeat surveillance as recommended in 2022.  He was scheduled in 2022 towards the end of the year but unfortunately had to cancel the procedure last-minute.  He was rescheduled for this year and he could not tolerate the preparation.  It is for this reason that the patient is being reevaluated in clinic to decide next steps in his work-up/management.  The patient states that his chronic nausea symptoms and vomiting for which she is been working with Dr. Havery Moros are still an issue.  He still deals with chronic constipation.  The patient does note that when he takes Linzess that he does have some improvement but he does not know if there is anything that could improve things further.  The patient denies any blood in his stools.  Patient did not restart his Plavix since his last colonoscopy preparation. ? ?GI Review of Systems ?Positive as above ?Negative  for odynophagia, dysphagia, alteration of bowel habits, melena, hematochezia  ? ?Review of Systems ?General: Denies fevers/chills/weight loss unintentionally ?Cardiovascular: Denies chest pain ?Pulmonary: Denies shortness of breath ?Gastroenterological: See HPI ?Genitourinary: Denies darkened urine or hematuria ?Hematological: Positive for easy bruising  ?Dermatological: Denies jaundice ?Psychological: Mood is stable ? ? ?Medications ?Current Outpatient Medications  ?Medication Sig Dispense Refill  ? atorvastatin (LIPITOR) 20 MG tablet TAKE 1 TABLET BY MOUTH AT BEDTIME 90 tablet 1  ? blood glucose meter kit and supplies Dispense based on patient and insurance preference. Use up to four times daily as directed. (FOR ICD-10 E10.9, E11.9). ?Use to check fasting blood sugar and 2 hrs after largest meal. 1 each 0  ? clopidogrel (PLAVIX) 75 MG tablet Take 1 tablet (75 mg total) by mouth daily. 90 tablet 3  ? folic acid (FOLVITE) 1 MG tablet Take 1 tablet (1 mg total) by mouth daily. 90 tablet 0  ? furosemide (LASIX) 20 MG tablet TAKE 1 TABLET BY MOUTH ONCE DAILY AS NEEDED FOR EDEMA 90 tablet 1  ? gabapentin (NEURONTIN) 600 MG tablet TAKE 2 TABLETS BY MOUTH IN THE MORNING, 2 IN THE AFTERNOON, AND 3 NIGHTLY 630 tablet 0  ? linaclotide (LINZESS) 145 MCG CAPS capsule Take 1 capsule (145 mcg total) by mouth daily before breakfast. 30 capsule 2  ? losartan (COZAAR) 50 MG tablet Take 1 tablet (50 mg total) by mouth daily. 90 tablet 0  ? metoCLOPramide (REGLAN) 5 MG tablet Take 1 tablet (5 mg total) by mouth 3 (three) times daily before meals. 90 tablet 0  ? ondansetron (ZOFRAN)  4 MG tablet TAKE 1 TABLET BY MOUTH EVERY 8 HOURS AS NEEDED FOR NAUSEA FOR VOMITING 30 tablet 0  ? Sodium Sulfate-Mag Sulfate-KCl (SUTAB) 407 535 1918 MG TABS Take 24 tablets by mouth as directed. 24 tablet 0  ? TRULICITY 9.16 BW/4.6KZ SOPN INJECT 0.75 MG INTO THE SKIN ONCE A WEEK 4 mL 0  ? Vitamin D, Ergocalciferol, (DRISDOL) 1.25 MG (50000 UNIT) CAPS  capsule Take 1 capsule by mouth once a week (Patient taking differently: Take 50,000 Units by mouth every Sunday.) 12 capsule 0  ? Continuous Blood Gluc Sensor (FREESTYLE LIBRE 3 SENSOR) MISC 1 Device by Does not apply route every 14 (fourteen) days. Place 1 sensor on the skin every 14 days. Use to check glucose continuously 2 each 3  ? ?No current facility-administered medications for this visit.  ? ? ?Allergies ?No Known Allergies ? ?Histories ?Past Medical History:  ?Diagnosis Date  ? Cirrhosis ([place])   ? Colon polyps   ? Complication of anesthesia   ? Severe panic attack, SOB, but does not have this issue with propofol  ? Diabetes mellitus without complication ([place])   ? Esophageal candidiasis ([place])   ? GERD (gastroesophageal reflux disease)   ? as a child, some as an adult  ? Hypertension   ? Sleep apnea   ? does not use Cpap  ? Thrombocytopenia ([place])   ? Wears glasses   ? ?Past Surgical History:  ?Procedure Laterality Date  ? APPENDECTOMY    ? BACK SURGERY    ? COLONOSCOPY    ? COLONOSCOPY WITH PROPOFOL N/A 07/02/2019  ? Procedure: COLONOSCOPY WITH PROPOFOL;  Surgeon: Mansouraty, Telford Nab., MD;  Location: [facility];  Service: Gastroenterology;  Laterality: N/A;  ? COLONOSCOPY WITH PROPOFOL N/A 09/17/2019  ? Procedure: COLONOSCOPY WITH PROPOFOL;  Surgeon: Mansouraty, Telford Nab., MD;  Location: [facility];  Service: Gastroenterology;  Laterality: N/A;  ? ENDOSCOPIC MUCOSAL RESECTION N/A 09/17/2019  ? Procedure: ENDOSCOPIC MUCOSAL RESECTION;  Surgeon: Rush Landmark Telford Nab., MD;  Location: [facility];  Service: Gastroenterology;  Laterality: N/A;  ? HEMOSTASIS CLIP PLACEMENT  09/17/2019  ? Procedure: HEMOSTASIS CLIP PLACEMENT;  Surgeon: Irving Copas., MD;  Location: [facility];  Service: Gastroenterology;;  ? IR ANGIOGRAM EXTREMITY LEFT  07/24/2020  ? IR RADIOLOGIST EVAL & MGMT  07/17/2020  ? IR TIB-PERO ART ATHEREC INC PTA MOD SED  07/24/2020  ? IR US GUIDE VASC ACCESS LEFT  07/24/2020  ? SUBMUCOSAL  LIFTING INJECTION  09/17/2019  ? Procedure: SUBMUCOSAL LIFTING INJECTION;  Surgeon: Irving Copas., MD;  Location: [facility];  Service: Gastroenterology;;  ? ?Social History  ? ?Socioeconomic History  ? Marital status: Married  ?  Spouse name: Not on file  ? Number of children: 1  ? Years of education: Not on file  ? Highest education level: Not on file  ?Occupational History  ? Occupation: unemployed  ?Tobacco Use  ? Smoking status: Former  ?  Types: Cigarettes  ?  Quit date: 06/23/1995  ?  Years since quitting: 26.0  ? Smokeless tobacco: Current  ?  Types: Chew  ?Vaping Use  ? Vaping Use: Never used  ?Substance and Sexual Activity  ? Alcohol use: Yes  ?  Comment: 3 drinks a month  ? Drug use: Yes  ?  Types: Marijuana  ? Sexual activity: Yes  ?  Birth control/protection: None  ?Other Topics Concern  ? Not on file  ?Social History Narrative  ? Not on file  ? ?Social Determinants of  Health  ? ?Financial Resource Strain: Not on file  ?Food Insecurity: Not on file  ?Transportation Needs: Not on file  ?Physical Activity: Not on file  ?Stress: Not on file  ?Social Connections: Not on file  ?Intimate Partner Violence: Not on file  ? ?Family History  ?Problem Relation Age of Onset  ? Brain cancer Mother   ? Diabetes Father   ? Breast cancer Sister   ? Colon cancer Neg Hx   ? Liver cancer Neg Hx   ? Esophageal cancer Neg Hx   ? Inflammatory bowel disease Neg Hx   ? Liver disease Neg Hx   ? Pancreatic cancer Neg Hx   ? Rectal cancer Neg Hx   ? Stomach cancer Neg Hx   ? Colon polyps Neg Hx   ? ?I have reviewed his medical, social, and family history in detail and updated the electronic medical record as necessary.  ? ? ?PHYSICAL EXAMINATION  ?BP 120/70   Pulse 80   Ht 5' 10.5" (1.791 m)   Wt 235 lb (106.6 kg)   BMI 33.24 kg/m?  ?Wt Readings from Last 3 Encounters:  ?07/15/21 235 lb (106.6 kg)  ?05/20/21 230 lb 7 oz (104.5 kg)  ?05/15/21 230 lb 1.9 oz (104.4 kg)  ?GEN: NAD, appears stated age, doesn't appear  chronically ill ?PSYCH: Cooperative, without pressured speech ?EYE: Conjunctivae pink, sclerae anicteric ?CV: Nontachycardic ?RESP: No audible wheezing ?GI: NABS, soft, obese, NT/ND ?MSK/EXT: No lower extremity

## 2021-07-16 NOTE — Telephone Encounter (Signed)
? ?  Dear Lorrene Reid PA-C ? ?We have scheduled the above named patient for a(n) Colonoscopy +EMR  procedure. Our records show that (s)he is on anticoagulation therapy. ? ?Please advise as to whether the patient may come off their therapy of Plavix x 5 days prior to their procedure which is scheduled for 09/08/21. ? ?Please route your response to [employer] or fax response to [phone] ? ?Sincerely, ? ? ? ?[facility] ? ? ? ?

## 2021-07-17 NOTE — Telephone Encounter (Signed)
Thank you Maritza for your quick response.  ?Pt has been informed okay to hold Plavix x5 days prior to procedure. Pt voiced understanding.  ?

## 2021-09-02 ENCOUNTER — Encounter (HOSPITAL_COMMUNITY): Payer: Self-pay | Admitting: Gastroenterology

## 2021-09-02 NOTE — Progress Notes (Signed)
Attempted to obtain medical history via telephone, unable to reach at this time. I left a voicemail to return pre surgical testing department's phone call.  

## 2021-09-06 ENCOUNTER — Other Ambulatory Visit: Payer: Self-pay | Admitting: Physician Assistant

## 2021-09-06 DIAGNOSIS — E1169 Type 2 diabetes mellitus with other specified complication: Secondary | ICD-10-CM

## 2021-09-07 ENCOUNTER — Encounter (HOSPITAL_COMMUNITY): Payer: Self-pay | Admitting: Anesthesiology

## 2021-09-07 NOTE — Anesthesia Preprocedure Evaluation (Deleted)
Anesthesia Evaluation  ? ? ?Reviewed: ?Allergy & Precautions, Patient's Chart, lab work & pertinent test results ? ?History of Anesthesia Complications ?Negative for: history of anesthetic complications ? ?Airway ? ? ? ? ? ? ? Dental ?  ?Pulmonary ?sleep apnea , former smoker,  ?  ? ? ? ? ? ? ? Cardiovascular ?hypertension, Pt. on medications ?+ Peripheral Vascular Disease  ? ? ? ?  ?Neuro/Psych ?negative neurological ROS ?   ? GI/Hepatic ?hiatal hernia, GERD  ,(+) Cirrhosis  ?  ?  ? ,   ?Endo/Other  ?diabetes ? Renal/GU ?negative Renal ROS  ?negative genitourinary ?  ?Musculoskeletal ?negative musculoskeletal ROS ?(+)  ? Abdominal ?  ?Peds ? Hematology ? ?(+) Blood dyscrasia, anemia , Plavix ?H/o thrombocytopenia   ?Anesthesia Other Findings ? ? Reproductive/Obstetrics ? ?  ? ? ? ? ? ? ? ? ? ? ? ? ? ?  ?  ? ? ? ? ? ? ? ? ?Anesthesia Physical ?Anesthesia Plan ? ?ASA: 3 ? ?Anesthesia Plan: MAC  ? ?Post-op Pain Management: Minimal or no pain anticipated  ? ?Induction: Intravenous ? ?PONV Risk Score and Plan: 1 and Propofol infusion, TIVA and Treatment may vary due to age or medical condition ? ?Airway Management Planned: Natural Airway, Nasal Cannula and Simple Face Mask ? ?Additional Equipment: None ? ?Intra-op Plan:  ? ?Post-operative Plan:  ? ?Informed Consent:  ? ?Plan Discussed with:  ? ?Anesthesia Plan Comments:   ? ? ? ? ? ? ?Anesthesia Quick Evaluation ? ?

## 2021-09-08 ENCOUNTER — Encounter (HOSPITAL_COMMUNITY): Admission: RE | Payer: Self-pay | Source: Home / Self Care

## 2021-09-08 ENCOUNTER — Telehealth: Payer: Self-pay

## 2021-09-08 ENCOUNTER — Ambulatory Visit (HOSPITAL_COMMUNITY)
Admission: RE | Admit: 2021-09-08 | Payer: BC Managed Care – PPO | Source: Home / Self Care | Admitting: Gastroenterology

## 2021-09-08 SURGERY — COLONOSCOPY WITH PROPOFOL
Anesthesia: Monitor Anesthesia Care

## 2021-09-08 MED ORDER — PROPOFOL 1000 MG/100ML IV EMUL
INTRAVENOUS | Status: AC
  Filled 2021-09-08: qty 100

## 2021-09-08 NOTE — Telephone Encounter (Signed)
Appt made for 10/07/21 at 24 am with Dr Havery Moros to discuss colon EMR ?Letter sent to the pt with appt information. ?

## 2021-09-08 NOTE — Patient Instructions (Incomplete)
Daily Diabetes Mellitus Record ? ?Check your blood glucose (BG) as told by your health care provider. Use this form to record your BG results as well as any diabetes medicines that you take, including insulin. ?A record of your BG results and a list of your current medicines are very helpful in managing your diabetes. Your BG numbers help your health care provider know whether your diabetes management plan needs to be changed. ?Patient name: ____________________________________ Week of ____________________ ?Daily BG results and diabetes medicines ?Date: _________ ?Breakfast - BG / Medicines: ________________ / __________________________________________________________ ?Lunch - BG / Medicines: ___________________ / __________________________________________________________ ?Dinner - BG / Medicines: __________________ / __________________________________________________________ ?Bedtime - BG / Medicines: ________________ / ___________________________________________________________ ?Date: _________ ?Breakfast - BG / Medicines: ________________ / __________________________________________________________ ?Lunch - BG / Medicines: ___________________ / __________________________________________________________ ?Dinner - BG / Medicines: __________________ / __________________________________________________________ ?Bedtime - BG / Medicines: ________________ / ___________________________________________________________ ?Date: _________ ?Breakfast - BG / Medicines: ________________ / __________________________________________________________ ?Lunch - BG / Medicines: ___________________ / __________________________________________________________ ?Dinner - BG / Medicines: __________________ / __________________________________________________________ ?Bedtime - BG / Medicines: ________________ / ___________________________________________________________ ?Date: _________ ?Breakfast - BG / Medicines: ________________ /  __________________________________________________________ ?Lunch - BG / Medicines: ___________________ / __________________________________________________________ ?Dinner - BG / Medicines: __________________ / __________________________________________________________ ?Bedtime - BG / Medicines: ________________ / ___________________________________________________________ ?Date: _________ ?Breakfast - BG / Medicines: ________________ / __________________________________________________________ ?Lunch - BG / Medicines: ___________________ / __________________________________________________________ ?Dinner - BG / Medicines: __________________ / __________________________________________________________ ?Bedtime - BG / Medicines: ________________ / ___________________________________________________________ ?Date: _________ ?Breakfast - BG / Medicines: ________________ / __________________________________________________________ ?Lunch - BG / Medicines: ___________________ / __________________________________________________________ ?Dinner - BG / Medicines: __________________ / __________________________________________________________ ?Bedtime - BG / Medicines: ________________ / ___________________________________________________________ ?Date: _________ ?Breakfast - BG / Medicines: ________________ / __________________________________________________________ ?Lunch - BG / Medicines: ___________________ / __________________________________________________________ ?Dinner - BG / Medicines: __________________ / __________________________________________________________ ?Bedtime - BG / Medicines: ________________ / ___________________________________________________________ ?Notes: ______________________________________________________________________________________________________________________ ?This information is not intended to replace advice given to you by your health care provider. Make sure you discuss  any questions you have with your health care provider. ?Document Revised: 10/25/2019 Document Reviewed: 10/25/2019 ?Elsevier Patient Education ? [place]. ? ?

## 2021-09-08 NOTE — Telephone Encounter (Signed)
Mansouraty, Telford Nab., MD  Paulo Fruit, RN; Timothy Lasso, RN; Havery Moros Carlota Raspberry, MD ?[facility],  ?Thank you for letting me know.  ? ?Stavros Cail,  ?This patient will get 1 more chance at being rescheduled for his procedure.  Please find the patient a visit with one of the APP's or with Dr. Havery Moros to see about optimizing his bowel habits before we reschedule.  He is going to get 1 more chance at rescheduling for his procedure since he did see me in clinic, but if he reschedules and cancels again this will be four missed colonoscopy with EMR slots and I will not continue offering him follow-up.  ?FYI SA.  ?Thanks.  ?GM   ?  ?   ?Previous Messages ?Attached Notes ? ?Progress Notes by Paulo Fruit, RN at 09/08/2021  6:23 AM ? ?Author: Paulo Fruit, RN Service: Endoscopy Author Type: Registered Nurse  ?Filed: 09/08/2021  6:24 AM Date of Service: 09/08/2021  6:23 AM Note Type: Progress Notes  ?Status: Signed Editor: Paulo Fruit, RN (Registered Nurse)  ?Pt left message on unit voicemail that he was not clean enough from prep and was not coming. Attempted to call patient pack within minutes after voicemail left.  No answer. Left message for patient to call back to obtain more information regarding his prep. No call back from patient.Dr. Rush Landmark made aware.   ?  ? ?

## 2021-09-08 NOTE — Progress Notes (Signed)
Pt left message on unit voicemail that he was not clean enough from prep and was not coming. Attempted to call patient pack within minutes after voicemail left.  No answer. Left message for patient to call back to obtain more information regarding his prep. No call back from patient.Dr. Rush Landmark made aware.  ?

## 2021-09-11 ENCOUNTER — Ambulatory Visit: Payer: BC Managed Care – PPO | Admitting: Physician Assistant

## 2021-09-11 DIAGNOSIS — E1169 Type 2 diabetes mellitus with other specified complication: Secondary | ICD-10-CM

## 2021-09-15 ENCOUNTER — Ambulatory Visit (INDEPENDENT_AMBULATORY_CARE_PROVIDER_SITE_OTHER): Payer: BC Managed Care – PPO | Admitting: Physician Assistant

## 2021-09-15 ENCOUNTER — Encounter: Payer: Self-pay | Admitting: Physician Assistant

## 2021-09-15 VITALS — BP 157/77 | HR 96 | Temp 97.7°F | Ht 71.0 in | Wt 230.0 lb

## 2021-09-15 DIAGNOSIS — E559 Vitamin D deficiency, unspecified: Secondary | ICD-10-CM | POA: Diagnosis not present

## 2021-09-15 DIAGNOSIS — E1159 Type 2 diabetes mellitus with other circulatory complications: Secondary | ICD-10-CM

## 2021-09-15 DIAGNOSIS — K5909 Other constipation: Secondary | ICD-10-CM

## 2021-09-15 DIAGNOSIS — I152 Hypertension secondary to endocrine disorders: Secondary | ICD-10-CM

## 2021-09-15 DIAGNOSIS — E1169 Type 2 diabetes mellitus with other specified complication: Secondary | ICD-10-CM

## 2021-09-15 DIAGNOSIS — E782 Mixed hyperlipidemia: Secondary | ICD-10-CM

## 2021-09-15 NOTE — Patient Instructions (Signed)

## 2021-09-15 NOTE — Assessment & Plan Note (Signed)
-  Followed by gastroenterology. ?-Recommend to continue with Miralax.  ?

## 2021-09-15 NOTE — Assessment & Plan Note (Signed)
-  BP elevated on intake, BP repeated with minimal improvement. Will repeat BP tomorrow with lab visit. If BP remains elevated (>130/80) then recommend resuming Losartan 50 mg. Will continue to monitor. ?

## 2021-09-15 NOTE — Assessment & Plan Note (Signed)
-  Last A1c 6.8, will repeat A1c with lab visit tomorrow. Will continue Trulicity 7.62 mg once a week. Will continue to monitor. ?

## 2021-09-15 NOTE — Progress Notes (Signed)
?Established patient visit ? ? ?Patient: [name]   DOB: [birth year]   56 y.o. Male  MRN: [id number] ?Visit Date: 09/15/2021 ? ?Chief Complaint  ?Patient presents with  ? Diabetes  ? Hypertension  ? ?Subjective  ?  ?HPI  ?Patient presents for chronic follow-up. Patient reports stopped taking some of his medications because of the colonoscopy he was suppose to have and feels like maybe was taking too much medication. States has been taking Miralax- 1 capful daily which is helping with his constipation. ? ?Diabetes: Pt denies increased urination or thirst. Pt reports taking Trulicity, last injection was this past Sunday. No hypoglycemic events. Not checking glucose at home.  ? ?HTN: Pt denies chest pain, palpitations, dizziness or shortness of breath. Reports lower extremity has been good, does not need to take Lasix daily. Has not been taking Losartan.  ? ?HLD: Pt not taking cholesterol medication.  ? ? ?Medications: ?Outpatient Medications Prior to Visit  ?Medication Sig  ? atorvastatin (LIPITOR) 20 MG tablet TAKE 1 TABLET BY MOUTH AT BEDTIME  ? blood glucose meter kit and supplies Dispense based on patient and insurance preference. Use up to four times daily as directed. (FOR ICD-10 E10.9, E11.9). ?Use to check fasting blood sugar and 2 hrs after largest meal.  ? clopidogrel (PLAVIX) 75 MG tablet Take 1 tablet (75 mg total) by mouth daily.  ? folic acid (FOLVITE) 1 MG tablet Take 1 tablet (1 mg total) by mouth daily.  ? furosemide (LASIX) 20 MG tablet TAKE 1 TABLET BY MOUTH ONCE DAILY AS NEEDED FOR EDEMA  ? gabapentin (NEURONTIN) 600 MG tablet TAKE 2 TABLETS BY MOUTH IN THE MORNING, 2 IN THE AFTERNOON, AND 3 NIGHTLY  ? linaclotide (LINZESS) 145 MCG CAPS capsule Take 1 capsule (145 mcg total) by mouth daily before breakfast.  ? losartan (COZAAR) 50 MG tablet Take 1 tablet (50 mg total) by mouth daily.  ? metoCLOPramide (REGLAN) 5 MG tablet Take 1 tablet (5 mg total) by mouth 3 (three) times daily before meals.  ?  ondansetron (ZOFRAN) 4 MG tablet TAKE 1 TABLET BY MOUTH EVERY 8 HOURS AS NEEDED FOR NAUSEA FOR VOMITING  ? TRULICITY 8.29 HB/7.1IR SOPN INJECT 0.75 MG INTO THE SKIN ONCE A WEEK  ? Vitamin D, Ergocalciferol, (DRISDOL) 1.25 MG (50000 UNIT) CAPS capsule Take 1 capsule by mouth once a week  ? Sodium Sulfate-Mag Sulfate-KCl (SUTAB) [phone] MG TABS Take 24 tablets by mouth as directed.  ? ?No facility-administered medications prior to visit.  ? ? ?Review of Systems ?Review of Systems:  ?A fourteen system review of systems was performed and found to be positive as per HPI. ? ?Last CBC ?Lab Results  ?Component Value Date  ? WBC 8.3 05/15/2021  ? HGB 12.9 (L) 05/15/2021  ? HCT 36.4 (L) 05/15/2021  ? MCV 90 05/15/2021  ? MCH 31.9 05/15/2021  ? RDW 11.7 05/15/2021  ? PLT 82 (LL) 05/15/2021  ? ?Last metabolic panel ?Lab Results  ?Component Value Date  ? GLUCOSE 209 (H) 05/15/2021  ? NA 136 05/15/2021  ? K 3.7 05/15/2021  ? CL 100 05/15/2021  ? CO2 25 05/15/2021  ? BUN 4 (L) 05/15/2021  ? CREATININE 1.05 05/15/2021  ? EGFR 85 05/15/2021  ? CALCIUM 9.1 05/15/2021  ? PROT 7.5 05/15/2021  ? ALBUMIN 3.6 (L) 05/15/2021  ? LABGLOB 3.9 05/15/2021  ? AGRATIO 0.9 (L) 05/15/2021  ? BILITOT 0.7 05/15/2021  ? ALKPHOS 172 (H) 05/15/2021  ? AST 28 05/15/2021  ?  ALT 27 05/15/2021  ? ANIONGAP 6 07/24/2020  ? ?Last lipids ?Lab Results  ?Component Value Date  ? CHOL 107 09/09/2020  ? HDL 28 (L) 09/09/2020  ? [place] 63 09/09/2020  ? LDLDIRECT 58 05/15/2021  ? TRIG 77 09/09/2020  ? CHOLHDL 3.8 09/09/2020  ? ?Last vitamin D ?Lab Results  ?Component Value Date  ? VD25OH 66.9 05/15/2021  ? ?  Objective  ?  ?BP (!) 157/77   Pulse 96   Temp 97.7 ?F (36.5 ?C)   Ht '5\' 11"'  (1.803 m)   Wt 230 lb (104.3 kg)   SpO2 100%   BMI 32.08 kg/m?  ?BP Readings from Last 3 Encounters:  ?09/15/21 (!) 157/77  ?07/15/21 120/70  ?05/20/21 (!) 146/72  ? ?Wt Readings from Last 3 Encounters:  ?09/15/21 230 lb (104.3 kg)  ?07/15/21 235 lb (106.6 kg)  ?05/20/21 230  lb 7 oz (104.5 kg)  ? ? ?Physical Exam  ?General:  Pleasant and cooperative, appropriate for stated age.  ?Neuro:  Alert and oriented,  extra-ocular muscles intact  ?HEENT:  Normocephalic, atraumatic, neck supple  ?Skin:  no gross rash, warm, dry. ?Cardiac:  RRR, S1 S2 ?Respiratory: CTA B/L w/o wheezing, crackles or rales. ?Vascular:  Ext warm, no cyanosis apprec. ?Psych:  No HI/SI, judgement and insight good, Euthymic mood. Full Affect. ? ? ?No results found for any visits on 09/15/21. ? Assessment & Plan  ?  ? ? ?Problem List Items Addressed This Visit   ? ?  ? Cardiovascular and Mediastinum  ? Hypertension associated with diabetes ([place]) (Chronic)  ?  -BP elevated on intake, BP repeated with minimal improvement. Will repeat BP tomorrow with lab visit. If BP remains elevated (>130/80) then recommend resuming Losartan 50 mg. Will continue to monitor. ? ?  ?  ? Relevant Orders  ? CBC  ? Comprehensive metabolic panel  ? TSH  ? Lipid panel  ? Hemoglobin A1c  ?  ? Digestive  ? Chronic constipation  ?  -Followed by gastroenterology. ?-Recommend to continue with Miralax.  ? ?  ?  ?  ? Endocrine  ? Diabetes mellitus ([place]) - Primary (Chronic)  ?  -Last A1c 6.8, will repeat A1c with lab visit tomorrow. Will continue Trulicity 2.19 mg once a week. Will continue to monitor. ? ?  ?  ? Relevant Orders  ? CBC  ? Comprehensive metabolic panel  ? TSH  ? Lipid panel  ? Hemoglobin A1c  ? VITAMIN D 25 Hydroxy (Vit-D Deficiency, Fractures)  ? Urine Microalbumin w/creat. ratio  ? Mixed diabetic hyperlipidemia associated with type 2 diabetes mellitus ([place])  ?  -Last direct LDL 58.  ?-Patient self-discontinued atorvastatin 20 mg. Will collect lipid panel with lab visit tomorrow and if LDL is not at goal of <70 then recommend resuming medication therapy.  ? ?  ?  ? Relevant Orders  ? CBC  ? Comprehensive metabolic panel  ? TSH  ? Lipid panel  ? Hemoglobin A1c  ?  ? Other  ? Vitamin D deficiency (Chronic)  ? ? ?Return in about 4 months  (around 01/16/2022) for DM, HTN, HLD; lab visit for FBW tomorrow.  ?   ? ? ? ?Lorrene Reid, PA-C  ?[facility] ?[phone] (phone) ?[phone] (fax) ? ?[facility] ?

## 2021-09-15 NOTE — Assessment & Plan Note (Signed)
-  Last direct LDL 58.  ?-Patient self-discontinued atorvastatin 20 mg. Will collect lipid panel with lab visit tomorrow and if LDL is not at goal of <70 then recommend resuming medication therapy.  ?

## 2021-09-16 ENCOUNTER — Other Ambulatory Visit: Payer: BC Managed Care – PPO

## 2021-09-16 DIAGNOSIS — E1169 Type 2 diabetes mellitus with other specified complication: Secondary | ICD-10-CM

## 2021-09-16 DIAGNOSIS — I152 Hypertension secondary to endocrine disorders: Secondary | ICD-10-CM

## 2021-09-18 LAB — SPECIMEN STATUS REPORT

## 2021-09-19 LAB — LIPID PANEL
Chol/HDL Ratio: 3.4 ratio (ref 0.0–5.0)
Cholesterol, Total: 113 mg/dL (ref 100–199)
HDL: 33 mg/dL — ABNORMAL LOW (ref >39)
LDL Chol Calc (NIH): 67 mg/dL (ref 0–99)
Triglycerides: 56 mg/dL (ref 0–149)
VLDL Cholesterol Cal: 13 mg/dL (ref 5–40)

## 2021-09-19 LAB — COMPREHENSIVE METABOLIC PANEL
ALT: 26 IU/L (ref 0–44)
AST: 28 IU/L (ref 0–40)
Albumin/Globulin Ratio: 0.9 — ABNORMAL LOW (ref 1.2–2.2)
Albumin: 3.4 g/dL — ABNORMAL LOW (ref 3.8–4.9)
Alkaline Phosphatase: 143 IU/L — ABNORMAL HIGH (ref 44–121)
BUN/Creatinine Ratio: 4 — ABNORMAL LOW (ref 9–20)
BUN: 5 mg/dL — ABNORMAL LOW (ref 6–24)
Bilirubin Total: 0.8 mg/dL (ref 0.0–1.2)
CO2: 25 mmol/L (ref 20–29)
Calcium: 9 mg/dL (ref 8.7–10.2)
Chloride: 104 mmol/L (ref 96–106)
Creatinine, Ser: 1.12 mg/dL (ref 0.76–1.27)
Globulin, Total: 3.8 g/dL (ref 1.5–4.5)
Glucose: 130 mg/dL — ABNORMAL HIGH (ref 70–99)
Potassium: 4.5 mmol/L (ref 3.5–5.2)
Sodium: 140 mmol/L (ref 134–144)
Total Protein: 7.2 g/dL (ref 6.0–8.5)
eGFR: 79 mL/min/{1.73_m2} (ref >59)

## 2021-09-19 LAB — MICROALBUMIN / CREATININE URINE RATIO

## 2021-09-19 LAB — HEMOGLOBIN A1C
Est. average glucose Bld gHb Est-mCnc: 137 mg/dL
Hgb A1c MFr Bld: 6.4 % — ABNORMAL HIGH (ref 4.8–5.6)

## 2021-09-19 LAB — CBC
Hematocrit: 34.9 % — ABNORMAL LOW (ref 37.5–51.0)
Hemoglobin: 12.7 g/dL — ABNORMAL LOW (ref 13.0–17.7)
MCH: 33.2 pg — ABNORMAL HIGH (ref 26.6–33.0)
MCHC: 36.4 g/dL — ABNORMAL HIGH (ref 31.5–35.7)
MCV: 91 fL (ref 79–97)
Platelets: 87 10*3/uL — CL (ref 150–450)
RBC: 3.83 x10E6/uL — ABNORMAL LOW (ref 4.14–5.80)
RDW: 12 % (ref 11.6–15.4)
WBC: 6.8 10*3/uL (ref 3.4–10.8)

## 2021-09-19 LAB — VITAMIN D 25 HYDROXY (VIT D DEFICIENCY, FRACTURES): Vit D, 25-Hydroxy: 33.2 ng/mL (ref 30.0–100.0)

## 2021-09-19 LAB — TSH: TSH: 0.727 u[IU]/mL (ref 0.450–4.500)

## 2021-09-26 ENCOUNTER — Other Ambulatory Visit: Payer: Self-pay | Admitting: Physician Assistant

## 2021-09-26 DIAGNOSIS — E1169 Type 2 diabetes mellitus with other specified complication: Secondary | ICD-10-CM

## 2021-10-07 ENCOUNTER — Ambulatory Visit: Payer: BC Managed Care – PPO | Admitting: Gastroenterology

## 2021-10-07 ENCOUNTER — Telehealth: Payer: Self-pay

## 2021-10-07 NOTE — Telephone Encounter (Signed)
Called patient on mobile number.  LM that he has one more chance to keep an appointment with our office or we will no longer make appts or schedule procedures for him.  Asked him to call back and get scheduled with Dr. Havery Moros for an office visit asap and voiced again that it is critical that he KEEP that appointment or we will no long make appts for him

## 2021-10-21 ENCOUNTER — Other Ambulatory Visit: Payer: Self-pay | Admitting: Physician Assistant

## 2021-10-21 DIAGNOSIS — E1169 Type 2 diabetes mellitus with other specified complication: Secondary | ICD-10-CM

## 2021-10-21 DIAGNOSIS — E1142 Type 2 diabetes mellitus with diabetic polyneuropathy: Secondary | ICD-10-CM

## 2021-11-11 ENCOUNTER — Telehealth: Payer: Self-pay | Admitting: Hematology and Oncology

## 2021-11-11 ENCOUNTER — Other Ambulatory Visit: Payer: Self-pay | Admitting: Physician Assistant

## 2021-11-11 DIAGNOSIS — E1169 Type 2 diabetes mellitus with other specified complication: Secondary | ICD-10-CM

## 2021-11-11 NOTE — Telephone Encounter (Signed)
Rescheduled appointment per provider covering [facility]. Left voicemail.

## 2021-11-18 ENCOUNTER — Inpatient Hospital Stay: Payer: BC Managed Care – PPO

## 2021-11-18 ENCOUNTER — Inpatient Hospital Stay: Payer: BC Managed Care – PPO | Admitting: Hematology and Oncology

## 2021-11-30 ENCOUNTER — Inpatient Hospital Stay: Payer: BC Managed Care – PPO | Admitting: Hematology and Oncology

## 2021-11-30 ENCOUNTER — Inpatient Hospital Stay: Payer: BC Managed Care – PPO | Attending: Hematology and Oncology

## 2021-11-30 NOTE — Assessment & Plan Note (Deleted)
Chronic thrombocytopenia Platelet count  08/14/2008: 89 July 2020: 115 01/15/2019: 100 05/15/21: 82  Mild thrombocytopenia: Platelet count 128 on 04/23/2016; 129 on 05/22/2016, 144 in February 2009 Remainder of the CBC with differential was normal, hemoglobin 15.9, WBC 4.8 with normal differential  Differential diagnosis: 1. 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  Left anterior tibial artery drug-eluting stent.

## 2021-12-21 ENCOUNTER — Telehealth: Payer: Self-pay | Admitting: Physician Assistant

## 2021-12-21 DIAGNOSIS — E1169 Type 2 diabetes mellitus with other specified complication: Secondary | ICD-10-CM

## 2021-12-21 MED ORDER — TRULICITY 0.75 MG/0.5ML ~~LOC~~ SOAJ: SUBCUTANEOUS | 0 refills | Status: DC

## 2021-12-21 NOTE — Telephone Encounter (Signed)
Refill sent to pharmacy. AS, CMA 

## 2021-12-21 NOTE — Telephone Encounter (Signed)
Patient requesting refill of Trulicity. Please advise.

## 2021-12-23 ENCOUNTER — Other Ambulatory Visit: Payer: Self-pay | Admitting: Physician Assistant

## 2021-12-23 DIAGNOSIS — E1169 Type 2 diabetes mellitus with other specified complication: Secondary | ICD-10-CM

## 2021-12-23 MED ORDER — OZEMPIC (0.25 OR 0.5 MG/DOSE) 2 MG/1.5ML ~~LOC~~ SOPN: PEN_INJECTOR | SUBCUTANEOUS | 1 refills | Status: DC

## 2022-01-12 ENCOUNTER — Other Ambulatory Visit: Payer: Self-pay | Admitting: Physician Assistant

## 2022-01-12 DIAGNOSIS — E1142 Type 2 diabetes mellitus with diabetic polyneuropathy: Secondary | ICD-10-CM

## 2022-02-01 ENCOUNTER — Telehealth: Payer: Self-pay

## 2022-02-01 NOTE — Telephone Encounter (Signed)
Patient's wife called office to inform that patient got sick this weekend due to his [place] shot, patient has been vomiting, and having the chills, please advise, thanks!

## 2022-02-01 NOTE — Telephone Encounter (Signed)
Patient scheduled for 10/3

## 2022-02-02 ENCOUNTER — Ambulatory Visit (INDEPENDENT_AMBULATORY_CARE_PROVIDER_SITE_OTHER): Payer: BC Managed Care – PPO | Admitting: Physician Assistant

## 2022-02-02 VITALS — BP 120/72 | HR 64 | Temp 98.5°F | Wt 215.9 lb

## 2022-02-02 DIAGNOSIS — R112 Nausea with vomiting, unspecified: Secondary | ICD-10-CM | POA: Diagnosis not present

## 2022-02-02 DIAGNOSIS — E1169 Type 2 diabetes mellitus with other specified complication: Secondary | ICD-10-CM

## 2022-02-02 MED ORDER — ONDANSETRON HCL 4 MG PO TABS: ORAL_TABLET | ORAL | 0 refills | Status: DC

## 2022-02-02 MED ORDER — DAPAGLIFLOZIN PROPANEDIOL 5 MG PO TABS: 5.0000 mg | ORAL_TABLET | Freq: Every day | ORAL | 3 refills | Status: DC

## 2022-02-02 NOTE — Progress Notes (Signed)
Established patient acute visit   Patient: [name]   DOB: 26-Dec-1965   56 y.o. Male  MRN: [id number] Visit Date: 02/02/2022  No chief complaint on file.  Subjective    HPI  Patient presents to discuss side effects from Ozempic including fatigue, vomiting, and rigors. Reports muscle soreness from vomiting. No fever or diarrhea. States did had mild vomit with previous Ozempic injections but nothing like this recent episode. Patient has stopped medication as advised and feeling some better today. Denies sick contact exposures. States able to keep some solids down yesterday. Drinking Gatorade.    Medications: Outpatient Medications Prior to Visit  Medication Sig Note   blood glucose meter kit and supplies Dispense based on patient and insurance preference. Use up to four times daily as directed. (FOR ICD-10 E10.9, E11.9). Use to check fasting blood sugar and 2 hrs after largest meal.    gabapentin (NEURONTIN) 600 MG tablet TAKE 2 TABLETS BY MOUTH IN THE MORNING AND TAKE 2 TABLETS IN THE AFTERNOON AND TAKE 3 TABLETS NIGHTLY    [DISCONTINUED] atorvastatin (LIPITOR) 20 MG tablet TAKE 1 TABLET BY MOUTH AT BEDTIME    [DISCONTINUED] clopidogrel (PLAVIX) 75 MG tablet Take 1 tablet (75 mg total) by mouth daily.    [DISCONTINUED] folic acid (FOLVITE) 1 MG tablet Take 1 tablet (1 mg total) by mouth daily.    [DISCONTINUED] furosemide (LASIX) 20 MG tablet TAKE 1 TABLET BY MOUTH ONCE DAILY AS NEEDED FOR EDEMA    [DISCONTINUED] linaclotide (LINZESS) 145 MCG CAPS capsule Take 1 capsule (145 mcg total) by mouth daily before breakfast.    [DISCONTINUED] losartan (COZAAR) 50 MG tablet Take 1 tablet (50 mg total) by mouth daily.    [DISCONTINUED] metoCLOPramide (REGLAN) 5 MG tablet Take 1 tablet (5 mg total) by mouth 3 (three) times daily before meals.    [DISCONTINUED] ondansetron (ZOFRAN) 4 MG tablet TAKE 1 TABLET BY MOUTH EVERY 8 HOURS AS NEEDED FOR NAUSEA FOR VOMITING    [DISCONTINUED]  Semaglutide,0.25 or 0.5MG/DOS, (OZEMPIC, 0.25 OR 0.5 MG/DOSE,) 2 MG/1.5ML SOPN Inject 0.25 mg into skin once a week x 4 weeks. Then inject 0.5 mg into skin once a week. 02/02/2022: side effects   [DISCONTINUED] Sodium Sulfate-Mag Sulfate-KCl (SUTAB) [phone] MG TABS Take 24 tablets by mouth as directed.    [DISCONTINUED] Vitamin D, Ergocalciferol, (DRISDOL) 1.25 MG (50000 UNIT) CAPS capsule Take 1 capsule by mouth once a week    No facility-administered medications prior to visit.    Review of Systems Review of Systems:  A fourteen system review of systems was performed and found to be positive as per HPI.  Last CBC Lab Results  Component Value Date   WBC 6.8 09/16/2021   HGB 12.7 (L) 09/16/2021   HCT 34.9 (L) 09/16/2021   MCV 91 09/16/2021   MCH 33.2 (H) 09/16/2021   RDW 12.0 09/16/2021   PLT 87 (LL) 73/41/9379   Last metabolic panel Lab Results  Component Value Date   GLUCOSE 130 (H) 09/16/2021   NA 140 09/16/2021   K 4.5 09/16/2021   CL 104 09/16/2021   CO2 25 09/16/2021   BUN 5 (L) 09/16/2021   CREATININE 1.12 09/16/2021   EGFR 79 09/16/2021   CALCIUM 9.0 09/16/2021   PROT 7.2 09/16/2021   ALBUMIN 3.4 (L) 09/16/2021   LABGLOB 3.8 09/16/2021   AGRATIO 0.9 (L) 09/16/2021   BILITOT 0.8 09/16/2021   ALKPHOS 143 (H) 09/16/2021   AST 28 09/16/2021   ALT 26 09/16/2021  ANIONGAP 6 07/24/2020   Last lipids Lab Results  Component Value Date   CHOL 113 09/16/2021   HDL 33 (L) 09/16/2021   LDLCALC 67 09/16/2021   LDLDIRECT 58 05/15/2021   TRIG 56 09/16/2021   CHOLHDL 3.4 09/16/2021   Last hemoglobin A1c Lab Results  Component Value Date   HGBA1C 6.4 (H) 09/16/2021   Last thyroid functions Lab Results  Component Value Date   TSH 0.727 09/16/2021   T3TOTAL 114 05/18/2018       Objective    BP 120/72   Pulse 64   Temp 98.5 F (36.9 C)   Wt 215 lb 14.4 oz (97.9 kg)   BMI 30.11 kg/m    Physical Exam  General:  Cooperative ill-appearing, in no acute  distress.  Neuro:  Alert and oriented,  extra-ocular muscles intact  HEENT:  Normocephalic, atraumatic, neck supple  Skin:  no gross rash, warm, pink. Abdomen: generalized tenderness, no distension, no guarding or rebound tenderness, neg Murphy's sign Cardiac:  RRR, S1 S2 Respiratory: CTA B/L  Vascular:  Ext warm, no cyanosis apprec.; cap RF less 2 sec. Psych:  No HI/SI, judgement and insight good, Euthymic mood. Full Affect.   No results found for any visits on 02/02/22.  Assessment & Plan     Problem List Items Addressed This Visit       Digestive   Nausea with vomiting    -Chronic with recent exacerbation likely secondary to medication adverse effect. Discussed to continue with fluid intake including electrolytes (recommend zero sugar Gatorade) and take ondansetron 4 mg every 8 hrs as needed for nausea.      Relevant Medications   ondansetron (ZOFRAN) 4 MG tablet     Endocrine   Diabetes mellitus ([place]) - Primary (Chronic)    -Patient with hx of chronic nausea and vomiting likely exacerbated by Ozempic. Previously tolerated Trulicity without issues but due to insurance preference changed to [place]. Discussed with patient alternatives and agreeable to trial SGLT2i so will start Farxiga 5 mg daily. Advised to start medication until able to keep fluids and solids down. Discussed potential medication side effects and advised to let me know if unable to tolerate medication. Pt verbalized understanding. Last A1c 6.4 at goal <7.0, will repeat today. Will collect CMP to evaluate renal function and electrolytes.       Relevant Medications   dapagliflozin propanediol (FARXIGA) 5 MG TABS tablet   Other Relevant Orders   HgB A1c   Comp Met (CMET)      Return in about 3 months (around 05/05/2022) for CPE and FBW.        Lorrene Reid, PA-C  [facility] at [facility] [phone] (phone) [phone] (fax)  [place]

## 2022-02-02 NOTE — Assessment & Plan Note (Addendum)
-  Patient with hx of chronic nausea and vomiting likely exacerbated by Ozempic. Previously tolerated Trulicity without issues but due to insurance preference changed to [place]. Discussed with patient alternatives and agreeable to trial SGLT2i so will start Farxiga 5 mg daily. Advised to start medication until able to keep fluids and solids down. Discussed potential medication side effects and advised to let me know if unable to tolerate medication. Pt verbalized understanding. Last A1c 6.4 at goal <7.0, will repeat today. Will collect CMP to evaluate renal function and electrolytes.

## 2022-02-02 NOTE — Assessment & Plan Note (Signed)
-  Chronic with recent exacerbation likely secondary to medication adverse effect. Discussed to continue with fluid intake including electrolytes (recommend zero sugar Gatorade) and take ondansetron 4 mg every 8 hrs as needed for nausea.

## 2022-02-03 LAB — COMPREHENSIVE METABOLIC PANEL
ALT: 24 IU/L (ref 0–44)
AST: 27 IU/L (ref 0–40)
Albumin/Globulin Ratio: 0.9 — ABNORMAL LOW (ref 1.2–2.2)
Albumin: 3.3 g/dL — ABNORMAL LOW (ref 3.8–4.9)
Alkaline Phosphatase: 147 IU/L — ABNORMAL HIGH (ref 44–121)
BUN/Creatinine Ratio: 5 — ABNORMAL LOW (ref 9–20)
BUN: 5 mg/dL — ABNORMAL LOW (ref 6–24)
Bilirubin Total: 0.6 mg/dL (ref 0.0–1.2)
CO2: 25 mmol/L (ref 20–29)
Calcium: 8.9 mg/dL (ref 8.7–10.2)
Chloride: 101 mmol/L (ref 96–106)
Creatinine, Ser: 1.1 mg/dL (ref 0.76–1.27)
Globulin, Total: 3.8 g/dL (ref 1.5–4.5)
Glucose: 106 mg/dL — ABNORMAL HIGH (ref 70–99)
Potassium: 4.2 mmol/L (ref 3.5–5.2)
Sodium: 135 mmol/L (ref 134–144)
Total Protein: 7.1 g/dL (ref 6.0–8.5)
eGFR: 80 mL/min/{1.73_m2} (ref >59)

## 2022-02-03 LAB — HEMOGLOBIN A1C
Est. average glucose Bld gHb Est-mCnc: 120 mg/dL
Hgb A1c MFr Bld: 5.8 % — ABNORMAL HIGH (ref 4.8–5.6)

## 2022-03-15 ENCOUNTER — Other Ambulatory Visit: Payer: Self-pay | Admitting: Physician Assistant

## 2022-03-15 DIAGNOSIS — R112 Nausea with vomiting, unspecified: Secondary | ICD-10-CM

## 2022-04-07 ENCOUNTER — Other Ambulatory Visit: Payer: Self-pay

## 2022-04-07 DIAGNOSIS — E1142 Type 2 diabetes mellitus with diabetic polyneuropathy: Secondary | ICD-10-CM

## 2022-04-07 MED ORDER — GABAPENTIN 600 MG PO TABS: ORAL_TABLET | ORAL | 0 refills | Status: DC

## 2022-06-19 ENCOUNTER — Other Ambulatory Visit: Payer: Self-pay | Admitting: Nurse Practitioner

## 2022-06-19 DIAGNOSIS — E1142 Type 2 diabetes mellitus with diabetic polyneuropathy: Secondary | ICD-10-CM

## 2022-06-21 NOTE — Telephone Encounter (Signed)
Pt scheduled CPE and FBW for 08/03/22.

## 2022-06-21 NOTE — Telephone Encounter (Signed)
Refill sent.

## 2022-06-21 NOTE — Telephone Encounter (Signed)
L.O.V: 02/02/22  N.O.V: Not scheduled   L.R.F: 04/07/22 Gabapentin 210 tab 0 refill.   Refills denied. OV required

## 2022-07-05 ENCOUNTER — Other Ambulatory Visit: Payer: Self-pay

## 2022-07-05 DIAGNOSIS — E1169 Type 2 diabetes mellitus with other specified complication: Secondary | ICD-10-CM

## 2022-07-05 MED ORDER — DAPAGLIFLOZIN PROPANEDIOL 5 MG PO TABS: 5.0000 mg | ORAL_TABLET | Freq: Every day | ORAL | 1 refills | Status: DC

## 2022-08-03 ENCOUNTER — Ambulatory Visit (INDEPENDENT_AMBULATORY_CARE_PROVIDER_SITE_OTHER): Payer: BC Managed Care – PPO | Admitting: Family Medicine

## 2022-08-03 ENCOUNTER — Telehealth: Payer: Self-pay | Admitting: Family Medicine

## 2022-08-03 ENCOUNTER — Encounter: Payer: Self-pay | Admitting: Family Medicine

## 2022-08-03 VITALS — BP 145/78 | HR 76 | Resp 18 | Ht 71.0 in | Wt 224.0 lb

## 2022-08-03 DIAGNOSIS — Z0000 Encounter for general adult medical examination without abnormal findings: Secondary | ICD-10-CM

## 2022-08-03 DIAGNOSIS — E1169 Type 2 diabetes mellitus with other specified complication: Secondary | ICD-10-CM

## 2022-08-03 DIAGNOSIS — E782 Mixed hyperlipidemia: Secondary | ICD-10-CM

## 2022-08-03 DIAGNOSIS — E1165 Type 2 diabetes mellitus with hyperglycemia: Secondary | ICD-10-CM

## 2022-08-03 DIAGNOSIS — E1159 Type 2 diabetes mellitus with other circulatory complications: Secondary | ICD-10-CM

## 2022-08-03 DIAGNOSIS — L97512 Non-pressure chronic ulcer of other part of right foot with fat layer exposed: Secondary | ICD-10-CM | POA: Diagnosis not present

## 2022-08-03 DIAGNOSIS — R112 Nausea with vomiting, unspecified: Secondary | ICD-10-CM

## 2022-08-03 DIAGNOSIS — I152 Hypertension secondary to endocrine disorders: Secondary | ICD-10-CM

## 2022-08-03 DIAGNOSIS — E1140 Type 2 diabetes mellitus with diabetic neuropathy, unspecified: Secondary | ICD-10-CM

## 2022-08-03 DIAGNOSIS — E08621 Diabetes mellitus due to underlying condition with foot ulcer: Secondary | ICD-10-CM

## 2022-08-03 DIAGNOSIS — M791 Myalgia, unspecified site: Secondary | ICD-10-CM

## 2022-08-03 DIAGNOSIS — E1142 Type 2 diabetes mellitus with diabetic polyneuropathy: Secondary | ICD-10-CM

## 2022-08-03 DIAGNOSIS — Z1211 Encounter for screening for malignant neoplasm of colon: Secondary | ICD-10-CM

## 2022-08-03 DIAGNOSIS — M6208 Separation of muscle (nontraumatic), other site: Secondary | ICD-10-CM

## 2022-08-03 DIAGNOSIS — Z89422 Acquired absence of other left toe(s): Secondary | ICD-10-CM | POA: Insufficient documentation

## 2022-08-03 MED ORDER — GABAPENTIN 600 MG PO TABS: ORAL_TABLET | ORAL | 0 refills | Status: DC

## 2022-08-03 MED ORDER — ONDANSETRON HCL 4 MG PO TABS
ORAL_TABLET | ORAL | 0 refills | Status: DC
Start: 2022-08-03 — End: 2023-07-18

## 2022-08-03 MED ORDER — TRULICITY 0.75 MG/0.5ML ~~LOC~~ SOAJ
0.7500 mg | SUBCUTANEOUS | 1 refills | Status: DC
Start: 2022-08-03 — End: 2023-01-09

## 2022-08-03 MED ORDER — BLOOD GLUCOSE METER KIT: PACK | 0 refills | Status: DC

## 2022-08-03 MED ORDER — FREESTYLE LIBRE 14 DAY READER DEVI
1.0000 | 11 refills | Status: DC
Start: 2022-08-03 — End: 2023-01-06

## 2022-08-03 MED ORDER — FREESTYLE LIBRE 14 DAY SENSOR MISC
1.0000 | Freq: Once | 11 refills | Status: AC
Start: 2022-08-03 — End: 2022-08-03

## 2022-08-03 NOTE — Telephone Encounter (Signed)
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  Gastroenterologist at the time recommended repeat colonoscopy in 6 months, but patient did not understand why this was the recommendation.  We discussed the meaning of tubulovillous adenoma and dysplasia which was the language used in the communication letter sent to him.  We discussed the importance of surveillance and screening for this type of finding on the polyp that was sent to pathology with his last colonoscopy.  Patient verbalized understanding.  He did request to go to a different location for his next colonoscopy, but he is agreeable to have 1 done in the next few months.

## 2022-08-03 NOTE — Assessment & Plan Note (Signed)
Given physical exam revealing midline abdominal bulge and history of obesity, diastasis recti more likely than hernia.  We discussed that if he is interested in further evaluation, we may consider a CT sometime in the future.  If it was bothersome enough, this would also potentially require a referral to general surgery for surgical correction.  For now, watchful waiting is appropriate as it is not causing symptoms.  At follow-up visit, will reevaluate and discuss necessity for a CT again.

## 2022-08-03 NOTE — Progress Notes (Signed)
Complete physical exam  Patient: [name]   DOB: 1965/09/25   57 y.o. Male  MRN: [id number]  Subjective:    Chief Complaint  Patient presents with   Annual Exam    Non Fasting     [name] is a 57 y.o. male who presents today for a complete physical exam. He reports consuming a general diet. The patient does not participate in regular exercise at present.  He does have additional problems to discuss today.  Previously was on Trulicity and did well but insurance stopped covering it, I preferred Ozempic which he tried and developed unbearable nausea and vomiting.  Since then has switched to daily [place] pills, but often forgets to take them.  He had the best control of his sugars when he was able to get a continuous blood glucose meter, but has not had one for some time again because insurance would not cover it. He has burning and soreness in his glutes and down his thighs.  He does have a history of back surgery on L5-S1.  There is some stiffness in the joints but he states it is minimal.  He also has a bulge in his abdomen when he is laying down and tries to sit up that has been there for some time, he did have some ultrasounds done of his abdomen a few years ago but it did not reveal anything significant.  He does have ongoing chronic constipation, nausea, and vomiting.  He is due for an eye exam and would like a referral to an ophthalmologist.  He would also like a referral to podiatry for the recurrent ulcer on his right foot.  Most recent fall risk assessment:    08/03/2022    9:43 AM  Fall Risk   Falls in the past year? 0  Number falls in past yr: 0  Injury with Fall? 0  Risk for fall due to : No Fall Risks     Most recent depression screenings:    08/03/2022    9:43 AM 08/03/2022    9:42 AM  PHQ 2/9 Scores  Exception Documentation Patient refusal Patient refusal    Patient Active Problem List   Diagnosis Date Noted   S/P amputation of lesser toe, left 08/03/2022    Diastasis recti 08/03/2022   Hx of adenomatous colonic polyps 07/16/2021   Chronic nausea 07/16/2021   Chronic constipation 07/16/2021   Long term current use of antithrombotics/antiplatelets 07/16/2021   Cecal polyp 06/02/2019   Abnormal colonoscopy 06/02/2019   Swelling of both lower extremities 05/29/2019   Thrombocytopenia 04/03/2019   Peripheral arterial disease 02/15/2019   Persistent proteinuria 11/14/2018   Hiatal hernia 11/14/2018   Anorexia 11/14/2018   Diabetic ulcer of toe of right foot associated with diabetes mellitus due to underlying condition, with fat layer exposed 07/11/2018   Mixed diabetic hyperlipidemia associated with type 2 diabetes mellitus 11/16/2017   Vitamin D deficiency 07/28/2017   Diabetes mellitus with hyperglycemia, without long-term current use of insulin 07/28/2017   Morbidly obese 07/28/2017   Leukocytosis, unspecified 06/22/2012   Diabetic neuropathy, painful ([place]) 06/22/2012   Hypertension associated with diabetes 06/22/2012    Past Surgical History:  Procedure Laterality Date   APPENDECTOMY     BACK SURGERY     COLONOSCOPY     COLONOSCOPY WITH PROPOFOL N/A 07/02/2019   Procedure: COLONOSCOPY WITH PROPOFOL;  Surgeon: Irving Copas., MD;  Location: [facility] ENDOSCOPY;  Service: Gastroenterology;  Laterality: N/A;   COLONOSCOPY  WITH PROPOFOL N/A 09/17/2019   Procedure: COLONOSCOPY WITH PROPOFOL;  Surgeon: Rush Landmark Telford Nab., MD;  Location: [place];  Service: Gastroenterology;  Laterality: N/A;   ENDOSCOPIC MUCOSAL RESECTION N/A 09/17/2019   Procedure: ENDOSCOPIC MUCOSAL RESECTION;  Surgeon: Rush Landmark Telford Nab., MD;  Location: [place];  Service: Gastroenterology;  Laterality: N/A;   HEMOSTASIS CLIP PLACEMENT  09/17/2019   Procedure: HEMOSTASIS CLIP PLACEMENT;  Surgeon: Irving Copas., MD;  Location: [place];  Service: Gastroenterology;;   IR ANGIOGRAM EXTREMITY LEFT  07/24/2020   IR RADIOLOGIST EVAL & MGMT   07/17/2020   IR TIB-PERO ART ATHEREC INC PTA MOD SED  07/24/2020   IR US GUIDE VASC ACCESS LEFT  07/24/2020   SUBMUCOSAL LIFTING INJECTION  09/17/2019   Procedure: SUBMUCOSAL LIFTING INJECTION;  Surgeon: Irving Copas., MD;  Location: [facility] ENDOSCOPY;  Service: Gastroenterology;;   Social History   Tobacco Use   Smoking status: Former    Types: Cigarettes    Quit date: 06/23/1995    Years since quitting: 27.1    Passive exposure: Never   Smokeless tobacco: Current    Types: Chew  Vaping Use   Vaping Use: Never used  Substance Use Topics   Alcohol use: Yes    Comment: 3 drinks a month   Drug use: Yes    Types: Marijuana   Family History  Problem Relation Age of Onset   Brain cancer Mother    Diabetes Father    Breast cancer Sister    Colon cancer Neg Hx    Liver cancer Neg Hx    Esophageal cancer Neg Hx    Inflammatory bowel disease Neg Hx    Liver disease Neg Hx    Pancreatic cancer Neg Hx    Rectal cancer Neg Hx    Stomach cancer Neg Hx    Colon polyps Neg Hx    No Known Allergies   Patient Care Team: Velva Harman, PA as PCP - General (Family Medicine) Minette Brine, FNP (General Practice)   Outpatient Medications Prior to Visit  Medication Sig   dapagliflozin propanediol (FARXIGA) 5 MG TABS tablet Take 1 tablet (5 mg total) by mouth daily before breakfast.   [DISCONTINUED] gabapentin (NEURONTIN) 600 MG tablet TAKE 2 TABLETS BY MOUTH IN THE MORNING AND 2 IN THE AFTERNOON AND 3 NIGHTLY   [DISCONTINUED] ondansetron (ZOFRAN) 4 MG tablet TAKE 1 TABLET BY MOUTH EVERY 8 HOURS AS NEEDED FOR NAUSEA FOR VOMITING   [DISCONTINUED] blood glucose meter kit and supplies Dispense based on patient and insurance preference. Use up to four times daily as directed. (FOR ICD-10 E10.9, E11.9). Use to check fasting blood sugar and 2 hrs after largest meal. (Patient not taking: Reported on 08/03/2022)   No facility-administered medications prior to visit.    Review of Systems   Constitutional:  Negative for chills, fever and malaise/fatigue.  HENT:  Negative for congestion and hearing loss.   Eyes:  Negative for blurred vision and double vision.  Respiratory:  Negative for cough and shortness of breath.   Cardiovascular:  Negative for chest pain, palpitations and leg swelling.  Gastrointestinal:  Positive for constipation (Chronic), nausea (Chronic) and vomiting (Chronic). Negative for abdominal pain, diarrhea and heartburn.  Genitourinary:  Negative for frequency and urgency.  Musculoskeletal:  Positive for myalgias ("Soreness" in the buttocks and thighs). Negative for joint pain and neck pain.  Neurological:  Positive for sensory change (Burning down buttocks and thighs). Negative for headaches.  Endo/Heme/Allergies:  Negative for  polydipsia.  Psychiatric/Behavioral:  Negative for depression. The patient is not nervous/anxious.        Objective:    BP (!) 145/78 (BP Location: Left Arm, Patient Position: Sitting, Cuff Size: Large)   Pulse 76   Resp 18   Ht 5\' 11"  (1.803 m)   Wt 224 lb (101.6 kg)   SpO2 100%   BMI 31.24 kg/m    Physical Exam Constitutional:      General: He is not in acute distress.    Appearance: Normal appearance.  HENT:     Head: Normocephalic and atraumatic.     Right Ear: Tympanic membrane, ear canal and external ear normal.     Left Ear: Tympanic membrane, ear canal and external ear normal.     Nose: Nose normal.     Mouth/Throat:     Mouth: Mucous membranes are moist.     Pharynx: Oropharynx is clear.  Eyes:     Pupils: Pupils are equal, round, and reactive to light.  Neck:     Thyroid: No thyromegaly or thyroid tenderness.  Cardiovascular:     Rate and Rhythm: Normal rate and regular rhythm.     Pulses: Normal pulses.          Dorsalis pedis pulses are 2+ on the right side and 2+ on the left side.       Posterior tibial pulses are 2+ on the right side and 2+ on the left side.     Heart sounds: Normal heart sounds.   Pulmonary:     Effort: Pulmonary effort is normal.     Breath sounds: Normal breath sounds.  Abdominal:     General: Bowel sounds are normal.     Palpations: Abdomen is soft. There is mass (Diastases recti present on head raise).     Tenderness: There is abdominal tenderness in the right upper quadrant, epigastric area and left upper quadrant.  Musculoskeletal:        General: Normal range of motion.     Cervical back: Normal range of motion and neck supple.  Feet:     Right foot:     Protective Sensation:   0 sites sensed.     Skin integrity: Ulcer (Right great toe, recurrent) present.     Toenail Condition: Right toenails are normal.     Left foot:     Protective Sensation:   0 sites sensed.     Skin integrity: No ulcer, blister or skin breakdown.     Toenail Condition: Left toenails are normal.  Skin:    General: Skin is warm and dry.  Neurological:     Mental Status: He is alert and oriented to person, place, and time.  Psychiatric:        Mood and Affect: Mood normal.       Assessment & Plan:    Routine Health Maintenance and Physical Exam  Immunization History  Administered Date(s) Administered   Hepb-cpg 05/30/2019, 07/06/2019   Influenza,inj,Quad PF,6+ Mos 01/15/2019, 02/06/2021   Moderna Sars-Covid-2 Vaccination 07/26/2019, 08/28/2019   PPD Test 02/19/2019    Health Maintenance  Topic Date Due   OPHTHALMOLOGY EXAM  Never done   HIV Screening  Never done   DTaP/Tdap/Td (1 - Tdap) Never done   Zoster Vaccines- Shingrix (1 of 2) Never done   FOOT EXAM  09/15/2021   COVID-19 Vaccine (3 - 2023-24 season) 08/19/2022 (Originally 01/01/2022)   HEMOGLOBIN A1C  08/04/2022   Diabetic kidney evaluation - Urine ACR  09/17/2022   INFLUENZA VACCINE  12/02/2022   Diabetic kidney evaluation - eGFR measurement  02/03/2023   COLONOSCOPY (Pts 45-66yrs Insurance coverage will need to be confirmed)  09/16/2029   Hepatitis C Screening  Completed   HPV VACCINES  Aged Out     Discussed health benefits of physical activity, and encouraged him to engage in regular exercise appropriate for his age and condition.  Type 2 diabetes mellitus with hyperglycemia, without long-term current use of insulin Assessment & Plan: Previously tolerated Trulicity without issues but due to insurance preference changed to [place].  Ozempic caused intolerable nausea and vomiting, most likely an exacerbation of pre-existing chronic nausea and vomiting.  Patient agreed to trial of Farxiga 5 mg tablets.  Repeating A1c today, patient believes it would likely be elevated because it is difficult for him to be consistent in taking a daily pill.  For insurance purposes: Patient previously tolerated Trulicity and had good glycemic control but had to discontinue due to insurance preference. Failed trial of insurance preferred Ozempic. Failed trial of Farxiga 5 mg tablets, is reasonable to resume Trulicity to regain better glycemic control. Patient already has developed complications of uncontrolled diabetes including amputation of the second toe on his left foot, painful diabetic neuropathy, recurrent diabetic ulcer on the great toe of his right foot for which he now requires specialty care, hyperlipidemia and hypertension associated with diabetes. To achieve better glycemic control, I would recommend resuming Trulicity as that was the best tolerated and patient is able to be most consistent with this regimen.  In conjunction with Trulicity, recommend continuous glucose monitoring.  Patient had success using a sample of the [place] monitor in the past, but is unable to pay for it out-of-pocket.  Continuous monitoring would give him a better chance of having consistent control of his blood sugar levels.   Diabetic neuropathy, painful ([place]) Assessment & Plan: Foot exam performed today, abnormal.  Recurrent foot ulcer, patient requesting referral to podiatry for management.  Continue gabapentin 600 mg  daily.  Will continue to monitor.  Orders: -     FreeStyle Libre 14 Day Sensor; 1 each by Does not apply route once for 1 dose.  Dispense: 1 each; Refill: 11 -     FreeStyle Libre 14 Day Reader; 1 Application by Does not apply route every 14 (fourteen) days. Use with sensor system  Dispense: 1 each; Refill: 11  Type 2 diabetes mellitus with other specified complication, without long-term current use of insulin Assessment & Plan: Previously tolerated Trulicity without issues but due to insurance preference changed to [place].  Ozempic caused intolerable nausea and vomiting, most likely an exacerbation of pre-existing chronic nausea and vomiting.  Patient agreed to trial of Farxiga 5 mg tablets.  Repeating A1c today, patient believes it would likely be elevated because it is difficult for him to be consistent in taking a daily pill.  For insurance purposes: Patient previously tolerated Trulicity and had good glycemic control but had to discontinue due to insurance preference. Failed trial of insurance preferred Ozempic. Failed trial of Farxiga 5 mg tablets, is reasonable to resume Trulicity to regain better glycemic control. Patient already has developed complications of uncontrolled diabetes including amputation of the second toe on his left foot, painful diabetic neuropathy, recurrent diabetic ulcer on the great toe of his right foot for which he now requires specialty care, hyperlipidemia and hypertension associated with diabetes. To achieve better glycemic control, I would recommend resuming Trulicity as that was the best tolerated  and patient is able to be most consistent with this regimen.  In conjunction with Trulicity, recommend continuous glucose monitoring.  Patient had success using a sample of the [place] monitor in the past, but is unable to pay for it out-of-pocket.  Continuous monitoring would give him a better chance of having consistent control of his blood sugar levels.  Orders: -      Hemoglobin A1c; Future -     Ambulatory referral to Ophthalmology -     Ambulatory referral to [place]; Inject 0.75 mg into the skin once a week.  Dispense: 2 mL; Refill: 1 -     FreeStyle Libre 14 Day Sensor; 1 each by Does not apply route once for 1 dose.  Dispense: 1 each; Refill: 11 -     FreeStyle Libre 14 Day Reader; 1 Application by Does not apply route every 14 (fourteen) days. Use with sensor system  Dispense: 1 each; Refill: 11  Diabetic peripheral neuropathy associated with type 2 diabetes mellitus -     Gabapentin; TAKE 2 TABLETS BY MOUTH IN THE MORNING AND 2 IN THE AFTERNOON AND 3 NIGHTLY  Dispense: 210 tablet; Refill: 0 -     Ambulatory referral to [place] 14 Day Sensor; 1 each by Does not apply route once for 1 dose.  Dispense: 1 each; Refill: 11 -     FreeStyle Libre 14 Day Reader; 1 Application by Does not apply route every 14 (fourteen) days. Use with sensor system  Dispense: 1 each; Refill: 11  Diabetic ulcer of toe of right foot associated with diabetes mellitus due to underlying condition, with fat layer exposed Assessment & Plan: Referral to podiatry as requested by patient.  Instructed patient to continue checking his feet regularly, keep wound clean and dry.  Orders: -     FreeStyle Libre 14 Day Sensor; 1 each by Does not apply route once for 1 dose.  Dispense: 1 each; Refill: 11 -     FreeStyle Libre 14 Day Reader; 1 Application by Does not apply route every 14 (fourteen) days. Use with sensor system  Dispense: 1 each; Refill: 11  S/P amputation of lesser toe, left Assessment & Plan: Secondary to hyperglycemia and fluctuating glucose levels due to uncontrolled diabetes.  Orders: -     FreeStyle Libre 14 Day Sensor; 1 each by Does not apply route once for 1 dose.  Dispense: 1 each; Refill: 11 -     FreeStyle Libre 14 Day Reader; 1 Application by Does not apply route every 14 (fourteen) days. Use with sensor system  Dispense: 1  each; Refill: 11  Hypertension associated with diabetes Assessment & Plan: BP elevated on intake, minimal improvement on repeat.  Above goal of less than 130/80 at 145/78 on repeat.  Patient stated that he had some ham for breakfast which was very salty and believes contributed to the high blood pressure reading in office.  On follow-up, if blood pressure still elevated then we will discuss resuming losartan 50 mg.  Will continue to monitor.  Orders: -     CBC with Differential/Platelet; Future -     Comprehensive metabolic panel; Future  Mixed diabetic hyperlipidemia associated with type 2 diabetes mellitus Assessment & Plan: Last lipid panel: LDL 67, HDL 33, triglycerides 56.  Repeating lipid panel today.  Continue heart healthy diet low in fat.  Orders: -     Lipid panel; Future  Nausea and  vomiting, unspecified vomiting type -     Ondansetron HCl; TAKE 1 TABLET BY MOUTH EVERY 8 HOURS AS NEEDED FOR NAUSEA FOR VOMITING  Dispense: 30 tablet; Refill: 0  Muscle pain -     CK  Diastasis recti Assessment & Plan: Given physical exam revealing midline abdominal bulge and history of obesity, diastasis recti more likely than hernia.  We discussed that if he is interested in further evaluation, we may consider a CT sometime in the future.  If it was bothersome enough, this would also potentially require a referral to general surgery for surgical correction.  For now, watchful waiting is appropriate as it is not causing symptoms.  At follow-up visit, will reevaluate and discuss necessity for a CT again.   We discussed that the etiology of the soreness and burning sensation in his buttocks and legs is likely of either neurologic or musculoskeletal origin.  Starting with CK to test for muscle involvement.  If negative, patient is open to referral to neurology.  Patient also expressed some confusion over his surgeon wanting to schedule another colonoscopy shortly after having his first 1 as his  understanding was that they found a polyp but nothing was wrong.  After the patient's appointment, I reviewed the notes, surgical pathology results, and letter to the patient that was sent 09/19/2019.  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  Patient had refused at the time.  I will get in touch with the patient to inform him of these findings and recommend a follow-up colonoscopy.  At the very least, he would be due for a repeat screening colonoscopy as it has been 3 years since his abnormal colonoscopy.  Return in about 3 months (around 11/02/2022) for follow-up, fasting blood work 1 week before.    I spent 70 minutes on the day of the encounter to include pre-visit record review, face-to-face time with the patient and post visit ordering of tests.  Approximately 20 minutes of this time was spent on routine healthcare maintenance and preventative care including annual physical exam and discussion regarding screenings, immunizations, and preventative lab work.  Remainder of the time was spent reviewing records, face-to-face time with the patient, and postvisit ordering of tests for other medical issues.  Velva Harman, PA

## 2022-08-03 NOTE — Assessment & Plan Note (Addendum)
Previously tolerated Trulicity without issues but due to insurance preference changed to [place].  Ozempic caused intolerable nausea and vomiting, most likely an exacerbation of pre-existing chronic nausea and vomiting.  Patient agreed to trial of Farxiga 5 mg tablets.  Repeating A1c today, patient believes it would likely be elevated because it is difficult for him to be consistent in taking a daily pill.  For insurance purposes: Patient previously tolerated Trulicity and had good glycemic control but had to discontinue due to insurance preference. Failed trial of insurance preferred Ozempic. Failed trial of Farxiga 5 mg tablets, is reasonable to resume Trulicity to regain better glycemic control. Patient already has developed complications of uncontrolled diabetes including amputation of the second toe on his left foot, painful diabetic neuropathy, recurrent diabetic ulcer on the great toe of his right foot for which he now requires specialty care, hyperlipidemia and hypertension associated with diabetes. To achieve better glycemic control, I would recommend resuming Trulicity as that was the best tolerated and patient is able to be most consistent with this regimen.  In conjunction with Trulicity, recommend continuous glucose monitoring.  Patient had success using a sample of the [place] monitor in the past, but is unable to pay for it out-of-pocket.  Continuous monitoring would give him a better chance of having consistent control of his blood sugar levels.

## 2022-08-03 NOTE — Assessment & Plan Note (Signed)
Last lipid panel: LDL 67, HDL 33, triglycerides 56.  Repeating lipid panel today.  Continue heart healthy diet low in fat.

## 2022-08-03 NOTE — Assessment & Plan Note (Signed)
Foot exam performed today, abnormal.  Recurrent foot ulcer, patient requesting referral to podiatry for management.  Continue gabapentin 600 mg daily.  Will continue to monitor.

## 2022-08-03 NOTE — Assessment & Plan Note (Signed)
Referral to podiatry as requested by patient.  Instructed patient to continue checking his feet regularly, keep wound clean and dry.

## 2022-08-03 NOTE — Assessment & Plan Note (Signed)
Secondary to hyperglycemia and fluctuating glucose levels due to uncontrolled diabetes.

## 2022-08-03 NOTE — Assessment & Plan Note (Signed)
BP elevated on intake, minimal improvement on repeat.  Above goal of less than 130/80 at 145/78 on repeat.  Patient stated that he had some ham for breakfast which was very salty and believes contributed to the high blood pressure reading in office.  On follow-up, if blood pressure still elevated then we will discuss resuming losartan 50 mg.  Will continue to monitor.

## 2022-08-05 ENCOUNTER — Encounter: Payer: Self-pay | Admitting: Family Medicine

## 2022-08-10 ENCOUNTER — Telehealth: Payer: Self-pay | Admitting: *Deleted

## 2022-08-10 NOTE — Telephone Encounter (Signed)
LVM for pt to call office to see which eye doctor he wanted the referral to go to, note said he wanted the same doctor his wife

## 2022-08-10 NOTE — Telephone Encounter (Signed)
Pt left a VM returning a call.

## 2022-08-10 NOTE — Telephone Encounter (Signed)
Contacted pt to see who he wanted the eye doctor referral to go to, he said he would have to check with his wife and would get back with [place].

## 2022-08-12 ENCOUNTER — Encounter: Payer: Self-pay | Admitting: *Deleted

## 2022-08-17 ENCOUNTER — Ambulatory Visit (INDEPENDENT_AMBULATORY_CARE_PROVIDER_SITE_OTHER): Payer: BC Managed Care – PPO | Admitting: Podiatry

## 2022-08-17 DIAGNOSIS — E1165 Type 2 diabetes mellitus with hyperglycemia: Secondary | ICD-10-CM

## 2022-08-17 DIAGNOSIS — L97512 Non-pressure chronic ulcer of other part of right foot with fat layer exposed: Secondary | ICD-10-CM | POA: Diagnosis not present

## 2022-08-17 NOTE — Progress Notes (Signed)
Subjective:  Patient ID: [name], male    DOB: [birth year],  MRN: [id number]  Chief Complaint  Patient presents with   Callouses    57 y.o. male presents for wound care.  Patient presents with right first metatarsophalangeal joint wound.  Patient states that it is starting acting back up again.  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 Denies N/V/F/Ch.  Past Medical History:  Diagnosis Date   Cirrhosis    Colon polyps    Complication of anesthesia    Severe panic attack, SOB, but does not have this issue with propofol   Diabetes mellitus without complication    Esophageal candidiasis    GERD (gastroesophageal reflux disease)    as a child, some as an adult   Hypertension    Sleep apnea    does not use Cpap   Thrombocytopenia    Wears glasses     Current Outpatient Medications:    Continuous Blood Gluc Receiver (FREESTYLE LIBRE 14 DAY READER) DEVI, 1 Application by Does not apply route every 14 (fourteen) days. Use with sensor system, Disp: 1 each, Rfl: 11   dapagliflozin propanediol (FARXIGA) 5 MG TABS tablet, Take 1 tablet (5 mg total) by mouth daily before breakfast., Disp: 30 tablet, Rfl: 1   Dulaglutide (TRULICITY) 0.75 MG/0.5ML SOPN, Inject 0.75 mg into the skin once a week., Disp: 2 mL, Rfl: 1   gabapentin (NEURONTIN) 600 MG tablet, TAKE 2 TABLETS BY MOUTH IN THE MORNING AND 2 IN THE AFTERNOON AND 3 NIGHTLY, Disp: 210 tablet, Rfl: 0   ondansetron (ZOFRAN) 4 MG tablet, TAKE 1 TABLET BY MOUTH EVERY 8 HOURS AS NEEDED FOR NAUSEA FOR VOMITING, Disp: 30 tablet, Rfl: 0  Social History   Tobacco Use  Smoking Status Former   Types: Cigarettes   Quit date: 06/23/1995   Years since quitting: 27.1   Passive exposure: Never  Smokeless Tobacco Current   Types: Chew    No Known  Allergies Objective:  There were no vitals filed for this visit. There is no height or weight on file to calculate BMI. Constitutional Well developed. Well nourished.  Vascular Dorsalis pedis pulses faintly palpable bilaterally. Posterior tibial pulses faintly palpable bilaterally. Capillary refill normal to all digits.  No cyanosis or clubbing noted. Pedal hair growth normal.  Neurologic Normal speech. Oriented to person, place, and time. Protective sensation absent  Dermatologic Wound Location: Right first metatarsophalangeal joint wound with fat layer exposed Wound Base: Fibrotic slough Peri-wound: Calloused Exudate: Scant/small amount Serous exudate Wound Measurements: -See below  Orthopedic: No pain to palpation either foot.   Radiographs: None Assessment:   1. Right foot ulcer, with fat layer exposed   2. Type 2 diabetes mellitus with hyperglycemia, without long-term current use of insulin     Plan:  Patient was evaluated and treated and all questions answered.  Ulcer right first metatarsophalangeal joint wound with fat layer exposed -Debridement as below. -Dressed with Betadine wet-to-dry, DSD. -Continue off-loading with surgical shoe. -  Procedure: Excisional Debridement of Wound Tool: Sharp chisel blade/tissue nipper Rationale: Removal of non-viable soft tissue from the wound to promote healing.  Anesthesia: none Pre-Debridement Wound Measurements: 2 cm x 1 cm x 0.4 cm  Post-Debridement Wound Measurements: 2.2 cm x 1.1 cm  x 0.6 cm  Type of Debridement: Sharp Excisional Tissue Removed: Non-viable soft tissue Blood loss: Minimal (<50cc) Depth of Debridement: subcutaneous tissue. Technique: Sharp excisional debridement to bleeding, viable wound base.  Wound Progress: This is my initial evaluation.  I will continue to monitor the progression of it. Site healing conversation 7 Dressing: Dry, sterile, compression dressing. Disposition: Patient tolerated  procedure well. Patient to return in 1 week for follow-up.  No follow-ups on file.

## 2022-09-08 ENCOUNTER — Ambulatory Visit (INDEPENDENT_AMBULATORY_CARE_PROVIDER_SITE_OTHER): Payer: BC Managed Care – PPO | Admitting: Podiatry

## 2022-09-08 DIAGNOSIS — L97512 Non-pressure chronic ulcer of other part of right foot with fat layer exposed: Secondary | ICD-10-CM | POA: Diagnosis not present

## 2022-09-08 DIAGNOSIS — I999 Unspecified disorder of circulatory system: Secondary | ICD-10-CM

## 2022-09-08 DIAGNOSIS — E1165 Type 2 diabetes mellitus with hyperglycemia: Secondary | ICD-10-CM | POA: Diagnosis not present

## 2022-09-08 NOTE — Telephone Encounter (Signed)
I am sending this to Hoyt Koch since I had not heard back from patient.

## 2022-09-08 NOTE — Progress Notes (Signed)
Subjective:  Patient ID: [name], male    DOB: [birth year],  MRN: [id number]  Chief Complaint  Patient presents with   Foot Ulcer    57 y.o. male presents for wound care.  Patient presents with right first metatarsophalangeal joint wound.  He states is about the same got a little bit harder has been applying Betadine wet-to-dry dressing Review of Systems: Negative except as noted in the HPI. Denies N/V/F/Ch.  Past Medical History:  Diagnosis Date   Cirrhosis ([name])    Colon polyps    Complication of anesthesia    Severe panic attack, SOB, but does not have this issue with propofol   Diabetes mellitus without complication ([name])    Esophageal candidiasis ([name])    GERD (gastroesophageal reflux disease)    as a child, some as an adult   Hypertension    Sleep apnea    does not use Cpap   Thrombocytopenia ([name])    Wears glasses     Current Outpatient Medications:    Continuous Blood Gluc Receiver (FREESTYLE LIBRE 14 DAY READER) DEVI, 1 Application by Does not apply route every 14 (fourteen) days. Use with sensor system, Disp: 1 each, Rfl: 11   dapagliflozin propanediol (FARXIGA) 5 MG TABS tablet, Take 1 tablet (5 mg total) by mouth daily before breakfast., Disp: 30 tablet, Rfl: 1   Dulaglutide (TRULICITY) 0.75 MG/0.5ML SOPN, Inject 0.75 mg into the skin once a week., Disp: 2 mL, Rfl: 1   gabapentin (NEURONTIN) 600 MG tablet, TAKE 2 TABLETS BY MOUTH IN THE MORNING AND 2 IN THE AFTERNOON AND 3 NIGHTLY, Disp: 210 tablet, Rfl: 0   ondansetron (ZOFRAN) 4 MG tablet, TAKE 1 TABLET BY MOUTH EVERY 8 HOURS AS NEEDED FOR NAUSEA FOR VOMITING, Disp: 30 tablet, Rfl: 0  Social History   Tobacco Use  Smoking Status Former   Types: Cigarettes   Quit date: 06/23/1995   Years since quitting: 27.2   Passive exposure: Never  Smokeless Tobacco Current   Types: Chew    No Known Allergies Objective:  There were no vitals filed for this visit. There is no height or weight on file to calculate  BMI. Constitutional Well developed. Well nourished.  Vascular Dorsalis pedis pulses faintly palpable bilaterally. Posterior tibial pulses faintly palpable bilaterally. Capillary refill normal to all digits.  No cyanosis or clubbing noted. Pedal hair growth normal.  Neurologic Normal speech. Oriented to person, place, and time. Protective sensation absent  Dermatologic Wound Location: Right first metatarsophalangeal joint wound with fat layer exposed Wound Base: Fibrotic slough Peri-wound: Calloused Exudate: Scant/small amount Serous exudate Wound Measurements: -See below  Orthopedic: No pain to palpation either foot.   Radiographs: None Assessment:   1. Vascular abnormality   2. Right foot ulcer, with fat layer exposed ([name])   3. Type 2 diabetes mellitus with hyperglycemia, without long-term current use of insulin ([name])      Plan:  Patient was evaluated and treated and all questions answered.  Ulcer right first metatarsophalangeal joint wound with fat layer exposed -Debridement as below. -Dressed with Betadine wet-to-dry, DSD. -Continue off-loading with surgical shoe.   Vascular abnormality -Questions and concerns were discussed with the patient in extensive detail given the amount of circulation the present I believe patient will benefit from ABIs.  To assess the vascular flow. -ABIs were ordered  Procedure: Excisional Debridement of Wound~stagnant Tool: Sharp chisel blade/tissue nipper Rationale: Removal of non-viable soft tissue from the wound to promote healing.  Anesthesia: none Pre-Debridement  Wound Measurements: 2 cm x 1 cm x 0.4 cm  Post-Debridement Wound Measurements: 2.2 cm x 1.1 cm x 0.6 cm  Type of Debridement: Sharp Excisional Tissue Removed: Non-viable soft tissue Blood loss: Minimal (<50cc) Depth of Debridement: subcutaneous tissue. Technique: Sharp excisional debridement to bleeding, viable wound base.  Wound Progress: T the wound is stagnant.   Appears to be in the same measurement. Site healing conversation 7 Dressing: Dry, sterile, compression dressing. Disposition: Patient tolerated procedure well. Patient to return in 1 week for follow-up.  No follow-ups on file.

## 2022-10-01 ENCOUNTER — Ambulatory Visit: Payer: BC Managed Care – PPO | Admitting: Podiatry

## 2022-10-08 ENCOUNTER — Other Ambulatory Visit: Payer: Self-pay | Admitting: Family Medicine

## 2022-10-08 DIAGNOSIS — E1142 Type 2 diabetes mellitus with diabetic polyneuropathy: Secondary | ICD-10-CM

## 2022-10-26 ENCOUNTER — Other Ambulatory Visit: Payer: BC Managed Care – PPO

## 2022-10-26 ENCOUNTER — Ambulatory Visit (INDEPENDENT_AMBULATORY_CARE_PROVIDER_SITE_OTHER): Payer: BC Managed Care – PPO | Admitting: Family Medicine

## 2022-10-26 ENCOUNTER — Encounter: Payer: Self-pay | Admitting: Family Medicine

## 2022-10-26 VITALS — BP 155/88 | HR 83 | Temp 98.4°F | Ht 71.0 in | Wt 229.0 lb

## 2022-10-26 DIAGNOSIS — S29011A Strain of muscle and tendon of front wall of thorax, initial encounter: Secondary | ICD-10-CM

## 2022-10-26 DIAGNOSIS — E1169 Type 2 diabetes mellitus with other specified complication: Secondary | ICD-10-CM

## 2022-10-26 DIAGNOSIS — E1159 Type 2 diabetes mellitus with other circulatory complications: Secondary | ICD-10-CM

## 2022-10-26 DIAGNOSIS — E782 Mixed hyperlipidemia: Secondary | ICD-10-CM

## 2022-10-26 MED ORDER — GUAIFENESIN 200 MG PO TABS: 200.0000 mg | ORAL_TABLET | ORAL | 0 refills | Status: DC | PRN

## 2022-10-26 MED ORDER — METHOCARBAMOL 500 MG PO TABS: 500.0000 mg | ORAL_TABLET | Freq: Four times a day (QID) | ORAL | 0 refills | Status: DC | PRN

## 2022-10-26 NOTE — Progress Notes (Signed)
   Acute Office Visit  Subjective:     Patient ID: [name], male    DOB: 1966/01/26, 57 y.o.   MRN: [id number]  Chief Complaint  Patient presents with   Acute Visit    Middle back pain x 4 days    HPI Patient is in today for mid back pain that started 4 days ago after coughing.  Patient states that he caught a cold from his wife.  Had runny nose, sneezing and then cough.  After coughing he developed some mild to moderate pain in the mid back on the right side.  2 days ago he was throwing laundry into his washing machine and the pain suddenly worsened.  Describes it as a sharp pain that goes from the middle of his lower thoracic region to the right side.  Has been taking ibuprofen occasionally but otherwise not taking any medications for this.  Patient has taken muscle relaxants in the past.  Has stated that he had had rib fractures on the right side several years ago.    ROS      Objective:    BP (!) 155/88   Pulse 83   Temp 98.4 F (36.9 C) (Oral)   Ht 5\' 11"  (1.803 m)   Wt 229 lb (103.9 kg)   SpO2 100%   BMI 31.94 kg/m    Physical Exam General: Alert and oriented CV: Regular rate and rhythm Pulmonary: Decreased inspiratory effort secondary to pain MSK: Tenderness palpation near the 10th and 11th ribs on the right side posteriorly.  No deformities.  No rashes.  No swelling or masses.  No results found for any visits on 10/26/22.      Assessment & Plan:   Intercostal muscle strain, initial encounter Assessment & Plan: Secondary to coughing which was likely due to viral respiratory infection.  Low suspicion for fracture. - Tylenol 1000 mg every 8 hours scheduled - Ibuprofen 400 mg every 6 hours as needed - Robaxin 500 mg every 6 hours as needed.  Patient can take 250 mg at first.  Has stated he was drowsy on the full dose in the past. - Recommended heat or ice topical therapy - Mucinex to help with his productive cough - Recommended patient follow-up with [place]  if he develops fever or if pain is not controlled by his treatment outlined above.   Other orders -     Methocarbamol; Take 1 tablet (500 mg total) by mouth every 6 (six) hours as needed for up to 30 doses for muscle spasms.  Dispense: 30 tablet; Refill: 0 -     guaiFENesin; Take 1 tablet (200 mg total) by mouth every 4 (four) hours as needed for cough or to loosen phlegm.  Dispense: 30 suppository; Refill: 0     Return if symptoms worsen or fail to improve.  Sandre Kitty, MD

## 2022-10-26 NOTE — Assessment & Plan Note (Signed)
Secondary to coughing which was likely due to viral respiratory infection.  Low suspicion for fracture. - Tylenol 1000 mg every 8 hours scheduled - Ibuprofen 400 mg every 6 hours as needed - Robaxin 500 mg every 6 hours as needed.  Patient can take 250 mg at first.  Has stated he was drowsy on the full dose in the past. - Recommended heat or ice topical therapy - Mucinex to help with his productive cough - Recommended patient follow-up with [place] if he develops fever or if pain is not controlled by his treatment outlined above.

## 2022-10-26 NOTE — Patient Instructions (Signed)
It was nice to see you today,  We addressed the following topics today: - take 1000mg  of tylenol every 8 hours.  - take 400mg  of ibuprofen every 6 hours as needed.  - take your robaxin muscle relaxant every 6 hours as needed.  - take your guaifenacin cough medicine as needed.    If you develop fever or worsening shortness of breath let us know.    Have a great day,  Frederic Jericho, MD

## 2022-10-27 LAB — CBC WITH DIFFERENTIAL/PLATELET

## 2022-10-27 LAB — COMPREHENSIVE METABOLIC PANEL
Alkaline Phosphatase: 144 IU/L — ABNORMAL HIGH (ref 44–121)
Bilirubin Total: 0.7 mg/dL (ref 0.0–1.2)
Potassium: 4.3 mmol/L (ref 3.5–5.2)

## 2022-10-27 LAB — LIPID PANEL
Cholesterol, Total: 114 mg/dL (ref 100–199)
HDL: 34 mg/dL — ABNORMAL LOW (ref >39)
LDL Chol Calc (NIH): 66 mg/dL (ref 0–99)
Triglycerides: 67 mg/dL (ref 0–149)

## 2022-10-27 LAB — HEMOGLOBIN A1C: Hgb A1c MFr Bld: 6.4 % — ABNORMAL HIGH (ref 4.8–5.6)

## 2022-10-28 LAB — CBC WITH DIFFERENTIAL/PLATELET
Basos: 0 %
Eos: 10 %
Hematocrit: 34.5 % — ABNORMAL LOW (ref 37.5–51.0)
Immature Grans (Abs): 0 10*3/uL (ref 0.0–0.1)
Immature Granulocytes: 0 %
MCHC: 35.9 g/dL — ABNORMAL HIGH (ref 31.5–35.7)
Monocytes: 8 %
Neutrophils Absolute: 5.6 10*3/uL (ref 1.4–7.0)
Neutrophils: 71 %
RBC: 3.69 x10E6/uL — ABNORMAL LOW (ref 4.14–5.80)
WBC: 7.9 10*3/uL (ref 3.4–10.8)

## 2022-10-28 LAB — COMPREHENSIVE METABOLIC PANEL
ALT: 25 IU/L (ref 0–44)
AST: 24 IU/L (ref 0–40)
Albumin: 3.1 g/dL — ABNORMAL LOW (ref 3.8–4.9)
BUN/Creatinine Ratio: 5 — ABNORMAL LOW (ref 9–20)
BUN: 5 mg/dL — ABNORMAL LOW (ref 6–24)
CO2: 26 mmol/L (ref 20–29)
Calcium: 9 mg/dL (ref 8.7–10.2)
Chloride: 102 mmol/L (ref 96–106)
Creatinine, Ser: 0.98 mg/dL (ref 0.76–1.27)
Globulin, Total: 3.8 g/dL (ref 1.5–4.5)
Glucose: 186 mg/dL — ABNORMAL HIGH (ref 70–99)
Sodium: 138 mmol/L (ref 134–144)
Total Protein: 6.9 g/dL (ref 6.0–8.5)
eGFR: 92 mL/min/{1.73_m2} (ref >59)

## 2022-10-28 LAB — LIPID PANEL
Chol/HDL Ratio: 3.4 ratio (ref 0.0–5.0)
VLDL Cholesterol Cal: 14 mg/dL (ref 5–40)

## 2022-10-28 LAB — HEMOGLOBIN A1C: Est. average glucose Bld gHb Est-mCnc: 137 mg/dL

## 2022-10-29 ENCOUNTER — Telehealth: Payer: Self-pay | Admitting: Podiatry

## 2022-10-29 NOTE — Telephone Encounter (Signed)
Called [employer] to check on pre-cert for CPT [phone] (vasc study / ABI) for patient.  No pre-auth required as per [place].  Spoke on 10/29/22 at 1:15pm  Notified Duncan at [facility] [place] also, so that he can reach out to patient to schedule.

## 2022-11-02 ENCOUNTER — Ambulatory Visit: Payer: BC Managed Care – PPO | Admitting: Family Medicine

## 2022-11-06 ENCOUNTER — Other Ambulatory Visit: Payer: Self-pay | Admitting: Family Medicine

## 2022-11-06 DIAGNOSIS — E1142 Type 2 diabetes mellitus with diabetic polyneuropathy: Secondary | ICD-10-CM

## 2022-11-12 ENCOUNTER — Telehealth: Payer: Self-pay | Admitting: Family Medicine

## 2022-11-12 NOTE — Telephone Encounter (Signed)
Patient called in and would like to know if Dr. Para March would take him on as a new patient. He stated that both of his sister Sydnee Levans and Darleen Crocker see Dr. Para March. Please advise Thank you!

## 2022-11-14 NOTE — Telephone Encounter (Signed)
Yes, please set up for August or September.  Thanks.

## 2022-11-15 NOTE — Telephone Encounter (Signed)
Spoke with patient and he has been scheduled

## 2022-12-09 ENCOUNTER — Ambulatory Visit: Payer: BC Managed Care – PPO | Admitting: Family Medicine

## 2022-12-16 ENCOUNTER — Encounter (INDEPENDENT_AMBULATORY_CARE_PROVIDER_SITE_OTHER): Payer: Self-pay

## 2022-12-30 ENCOUNTER — Ambulatory Visit (INDEPENDENT_AMBULATORY_CARE_PROVIDER_SITE_OTHER): Payer: BC Managed Care – PPO | Admitting: Podiatry

## 2022-12-30 DIAGNOSIS — L97512 Non-pressure chronic ulcer of other part of right foot with fat layer exposed: Secondary | ICD-10-CM | POA: Diagnosis not present

## 2022-12-30 DIAGNOSIS — I999 Unspecified disorder of circulatory system: Secondary | ICD-10-CM | POA: Diagnosis not present

## 2022-12-30 DIAGNOSIS — E1165 Type 2 diabetes mellitus with hyperglycemia: Secondary | ICD-10-CM | POA: Diagnosis not present

## 2022-12-30 NOTE — Progress Notes (Signed)
Subjective:  Patient ID: [name], male    DOB: 1965/08/09,  MRN: [id number]  Chief Complaint  Patient presents with   Wound Check    57 y.o. male presents for wound care.  Patient has a right submetatarsal 1 ulceration he came back again.  Has not gotten any better.  He wants to follow-up he was lost to follow-up due to loss of job Review of Systems: Negative except as noted in the HPI. Denies N/V/F/Ch.  Past Medical History:  Diagnosis Date   Cirrhosis ([name])    Colon polyps    Complication of anesthesia    Severe panic attack, SOB, but does not have this issue with propofol   Diabetes mellitus without complication ([name])    Esophageal candidiasis ([name])    GERD (gastroesophageal reflux disease)    as a child, some as an adult   Hypertension    Sleep apnea    does not use Cpap   Thrombocytopenia ([name])    Wears glasses     Current Outpatient Medications:    Continuous Blood Gluc Receiver (FREESTYLE LIBRE 14 DAY READER) DEVI, 1 Application by Does not apply route every 14 (fourteen) days. Use with sensor system, Disp: 1 each, Rfl: 11   dapagliflozin propanediol (FARXIGA) 5 MG TABS tablet, Take 1 tablet (5 mg total) by mouth daily before breakfast., Disp: 30 tablet, Rfl: 1   Dulaglutide (TRULICITY) 0.75 MG/0.5ML SOPN, Inject 0.75 mg into the skin once a week., Disp: 2 mL, Rfl: 1   gabapentin (NEURONTIN) 600 MG tablet, TAKE 2 TABLETS BY MOUTH IN THE MORNING, 2 TABLETS IN THE AFTERNOON, AND 3 TABLETS AT NIGHT, Disp: 210 tablet, Rfl: 1   guaiFENesin 200 MG tablet, Take 1 tablet (200 mg total) by mouth every 4 (four) hours as needed for cough or to loosen phlegm., Disp: 30 suppository, Rfl: 0   methocarbamol (ROBAXIN) 500 MG tablet, Take 1 tablet (500 mg total) by mouth every 6 (six) hours as needed for up to 30 doses for muscle spasms., Disp: 30 tablet, Rfl: 0   ondansetron (ZOFRAN) 4 MG tablet, TAKE 1 TABLET BY MOUTH EVERY 8 HOURS AS NEEDED FOR NAUSEA FOR VOMITING, Disp: 30 tablet,  Rfl: 0  Social History   Tobacco Use  Smoking Status Former   Current packs/day: 0.00   Types: Cigarettes   Quit date: 06/23/1995   Years since quitting: 27.5   Passive exposure: Never  Smokeless Tobacco Current   Types: Chew    No Known Allergies Objective:  There were no vitals filed for this visit. There is no height or weight on file to calculate BMI. Constitutional Well developed. Well nourished.  Vascular Dorsalis pedis pulses faintly palpable bilaterally. Posterior tibial pulses faintly palpable bilaterally. Capillary refill normal to all digits.  No cyanosis or clubbing noted. Pedal hair growth normal.  Neurologic Normal speech. Oriented to person, place, and time. Protective sensation absent  Dermatologic Wound Location: Right first metatarsophalangeal joint wound with fat layer exposed Wound Base: Fibrotic slough Peri-wound: Calloused Exudate: Scant/small amount Serous exudate Wound Measurements: -See below  Orthopedic: No pain to palpation either foot.   Radiographs: None Assessment:   1. Vascular abnormality      Plan:  Patient was evaluated and treated and all questions answered.  Ulcer right first metatarsophalangeal joint wound with fat layer exposed -Debridement as below. -Dressed with Betadine wet-to-dry, DSD. -Continue off-loading with surgical shoe.   Vascular abnormality -Questions and concerns were discussed with the patient in extensive  detail given the amount of circulation the present I believe patient will benefit from ABIs.  To assess the vascular flow. -ABIs were ordered reordered  Procedure: Excisional Debridement of Wound~stagnant Tool: Sharp chisel blade/tissue nipper Rationale: Removal of non-viable soft tissue from the wound to promote healing.  Anesthesia: none Pre-Debridement Wound Measurements: 2 cm x 1 cm x 0.4 cm  Post-Debridement Wound Measurements: 2.2 cm x 1.1 cm x 0.6 cm  Type of Debridement: Sharp  Excisional Tissue Removed: Non-viable soft tissue Blood loss: Minimal (<50cc) Depth of Debridement: subcutaneous tissue. Technique: Sharp excisional debridement to bleeding, viable wound base.  Wound Progress: T the wound is stagnant.  Appears to be in the same measurement. Site healing conversation 7 Dressing: Dry, sterile, compression dressing. Disposition: Patient tolerated procedure well. Patient to return in 1 week for follow-up.  No follow-ups on file.

## 2023-01-06 ENCOUNTER — Ambulatory Visit (INDEPENDENT_AMBULATORY_CARE_PROVIDER_SITE_OTHER): Payer: BC Managed Care – PPO | Admitting: Family Medicine

## 2023-01-06 ENCOUNTER — Encounter: Payer: Self-pay | Admitting: Family Medicine

## 2023-01-06 VITALS — BP 122/64 | HR 82 | Temp 98.3°F | Ht 71.0 in | Wt 224.0 lb

## 2023-01-06 DIAGNOSIS — K7460 Unspecified cirrhosis of liver: Secondary | ICD-10-CM | POA: Diagnosis not present

## 2023-01-06 DIAGNOSIS — Z7189 Other specified counseling: Secondary | ICD-10-CM

## 2023-01-06 DIAGNOSIS — E1149 Type 2 diabetes mellitus with other diabetic neurological complication: Secondary | ICD-10-CM | POA: Diagnosis not present

## 2023-01-06 DIAGNOSIS — D696 Thrombocytopenia, unspecified: Secondary | ICD-10-CM

## 2023-01-06 DIAGNOSIS — Z23 Encounter for immunization: Secondary | ICD-10-CM

## 2023-01-06 DIAGNOSIS — M549 Dorsalgia, unspecified: Secondary | ICD-10-CM | POA: Diagnosis not present

## 2023-01-06 DIAGNOSIS — I739 Peripheral vascular disease, unspecified: Secondary | ICD-10-CM | POA: Diagnosis not present

## 2023-01-06 DIAGNOSIS — G8929 Other chronic pain: Secondary | ICD-10-CM

## 2023-01-06 NOTE — Patient Instructions (Signed)
Don't change your meds for now.  Keep the podiatry appointment.  Let me check your records and we'll make plans at that point.  Take care.  Glad to see you.

## 2023-01-06 NOTE — Progress Notes (Signed)
Transfer of care.   Wife designated if patient were incapacitated.    Diabetes:  On trulicity.  No clear ADE on med.  See below. Hypoglycemic episodes: no sx  Hyperglycemic episodes: no sx Feet problems: see below.   Blood Sugars averaging: not checked.   He is off farxiga in the meantime.  Still on trulicity.   Taking gabapentin daily with PRN zofran.   He had trouble getting libre meter covered.    Flu shot done at [facility].   Chronic nausea/vomiting noted by patient.  Sore sensation near the epigastic area.  Pain with eating.  Going on for years.  Predates trulicity.  In talking with patient it is not clear if he has a history of diagnosed gastroparesis.  He can wake up in panic from anesthesia.  He has tolerated propofol.  Noted.  Discussed.  Footcare discussed with patient.  R foot with ulceration/callous.  Stocking and glove neuropathy, for years.  Has podiatry f/u pending.  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  Also with callous L 1st ray.  Previous abdominal ultrasound noted. Hepatic cirrhosis without discrete focal mass identified.  History of low platelets noted. History of colonoscopy noted with previous clip placement.  History of EGD noted.  History of thrombocytopenia noted.  No bleeding.  He has been taking gabapentin for back pain at baseline.   History of PAD noted, previously seen by cardiology.  He has ABIs reordered per podiatry.  PMH and SH reviewed  Meds, vitals, and allergies reviewed.   ROS: Per HPI unless specifically indicated in ROS section   GEN: nad, alert and oriented HEENT: ncat NECK: supple w/o LA CV: rrr. PULM: ctab, no inc wob ABD: soft, +bs EXT: no edema SKIN: no acute rash Tender at epigastrum.   R foot bandaged Callus on L 1st ray  38 minutes were devoted to patient care in this encounter (this includes time spent reviewing the patient's file/history, interviewing and examining the patient, counseling/reviewing plan with patient).

## 2023-01-09 ENCOUNTER — Telehealth: Payer: Self-pay | Admitting: Family Medicine

## 2023-01-09 DIAGNOSIS — Z7189 Other specified counseling: Secondary | ICD-10-CM | POA: Insufficient documentation

## 2023-01-09 DIAGNOSIS — K7460 Unspecified cirrhosis of liver: Secondary | ICD-10-CM | POA: Insufficient documentation

## 2023-01-09 DIAGNOSIS — E1169 Type 2 diabetes mellitus with other specified complication: Secondary | ICD-10-CM

## 2023-01-09 DIAGNOSIS — G8929 Other chronic pain: Secondary | ICD-10-CM | POA: Insufficient documentation

## 2023-01-09 DIAGNOSIS — K30 Functional dyspepsia: Secondary | ICD-10-CM

## 2023-01-09 MED ORDER — TRULICITY 0.75 MG/0.5ML ~~LOC~~ SOAJ
0.7500 mg | SUBCUTANEOUS | 2 refills | Status: DC
Start: 2023-01-09 — End: 2023-05-25

## 2023-01-09 NOTE — Telephone Encounter (Signed)
Please call patient.  Due for follow-up with GI clinic.  Encourage him to call GI about follow-up re: repeat colonoscopy and possible repeat EGD.  I put in the order to get a gastric emptying study done to see if he has gastroparesis/delayed gastric emptying.  That may shed some light on his chronic GI symptoms.  He does have cirrhosis documented by previous ultrasound, but this can happen even without drinking alcohol.  I think it makes sense to follow-up with GI.  Would avoid alcohol.  I put in the orders for follow-up labs this fall (ie October), needs office visit scheduled after that.  I would continue Trulicity for now and I sent a refill to [facility] family pharmacy on [address] we may have to change that after we see his gastric emptying study.  He has ABIs reordered per podiatry.  I think it makes sense to follow through with that to evaluate the vessels in his legs.  I will await his labs, his podiatry notes, his ABI report, and his GI follow-up notes.  Thanks.

## 2023-01-09 NOTE — Assessment & Plan Note (Signed)
Patient reported gabapentin helped.  Not sedated.  Continue as is.

## 2023-01-09 NOTE — Assessment & Plan Note (Signed)
Wife designated if patient were incapacitated.  

## 2023-01-09 NOTE — Assessment & Plan Note (Addendum)
He has diabetic neuropathy.  Unclear if he has gastroparesis.  We can evaluate for that.  GI symptoms predate Trulicity use.  See following phone note regarding gastric emptying study and follow-up labs.  We may need to switch him off of Trulicity after we see his gastric emptying study.  He has podiatry follow-up pending.  Recheck periodically.

## 2023-01-09 NOTE — Assessment & Plan Note (Signed)
Previously documented by ultrasound.  See following phone note.  Will encourage GI follow-up and we can recheck labs periodically.  Thrombocytopenia may be related to liver disease.  Discussed with patient.

## 2023-01-09 NOTE — Assessment & Plan Note (Signed)
He has follow-up ABIs ordered by podiatry.

## 2023-01-09 NOTE — Assessment & Plan Note (Signed)
Could be related to liver disease.  See follow-up labs and following phone note.

## 2023-01-10 ENCOUNTER — Encounter: Payer: Self-pay | Admitting: *Deleted

## 2023-01-10 NOTE — Telephone Encounter (Signed)
Spoke with patient about everything in message below. He agreed to all and will await follow/options once done.

## 2023-01-10 NOTE — Telephone Encounter (Signed)
Called patient via TE note that was in the pool from last night and spoke with him about everything. Nothing further needed.

## 2023-01-15 ENCOUNTER — Other Ambulatory Visit: Payer: Self-pay | Admitting: Family Medicine

## 2023-01-15 DIAGNOSIS — E1169 Type 2 diabetes mellitus with other specified complication: Secondary | ICD-10-CM

## 2023-01-20 ENCOUNTER — Ambulatory Visit: Payer: BC Managed Care – PPO | Admitting: Podiatry

## 2023-01-21 ENCOUNTER — Telehealth: Payer: Self-pay | Admitting: Family Medicine

## 2023-01-21 NOTE — Telephone Encounter (Signed)
Patient contacted the office regarding medication Dulaglutide (TRULICITY) 0.75 MG/0.5ML SOPN , patient's insurance company has denied this medication and they are needing something from Dr. Para March showing medical necessity. Patient says he is not able to take ozempic, patient can be reached at [phone].

## 2023-01-26 ENCOUNTER — Other Ambulatory Visit (HOSPITAL_COMMUNITY): Payer: Self-pay

## 2023-01-26 NOTE — Telephone Encounter (Signed)
Patient notified rx was approved.

## 2023-01-26 NOTE — Telephone Encounter (Signed)
Per test claim, PA is not required. [place] adds an Curator if pt qualifies, but without E-voucher, pt's copay is $47.00.

## 2023-02-04 ENCOUNTER — Telehealth: Payer: Self-pay | Admitting: Family Medicine

## 2023-02-04 NOTE — Telephone Encounter (Signed)
Spoke with patient and rx is still over $100 and can not afford to get rx. I advised patient on what last TE note stated and he said that's not what [facility] is telling him. I offered patient discount card found online but still not sure if that will work for patient; he will try but still wants this note sent to pharmacy techs to see what's happening with his rx. He has not been able to take his meds in a month. Why is cost showing different from pharmacy?

## 2023-02-04 NOTE — Telephone Encounter (Signed)
Please see prev phone note and check with pharmacy about med approval.  I thought it was going to be affordable based on the prev note.  Then please update patient.  Thanks.

## 2023-02-04 NOTE — Telephone Encounter (Signed)
Pt called in and stated that the medication he went to pick up the pharmacy is still costing a lot the medication is the Dulaglutide (TRULICITY) 0.75 MG/0.5ML SOPN  pt would also like a called back from the doctor if possible pt stated that the doctor was suppose to write a letter stated why pt needed the medication so his his insurance would pay for it

## 2023-02-08 ENCOUNTER — Telehealth: Payer: Self-pay

## 2023-02-08 ENCOUNTER — Other Ambulatory Visit (HOSPITAL_COMMUNITY): Payer: Self-pay

## 2023-02-08 NOTE — Telephone Encounter (Signed)
Pharmacy Patient Advocate Encounter   Received notification from Pt Calls Messages that prior authorization for Trulicity 0.75mg /0.11ml is required/requested.   Insurance verification completed.   The patient is insured through [employer] [facility] .   Per test claim: PA required; PA submitted to [employer] [facility] via CoverMyMeds Key/confirmation #/EOC [facility] Status is pending

## 2023-02-09 NOTE — Telephone Encounter (Signed)
Pharmacy Patient Advocate Encounter  Received notification from [employer] [facility] that Prior Authorization for Trulicity 0.75MG /0.5ML auto-injectors has been APPROVED from 02-08-2023 to 02-07-2026   PA #/Case ID/Reference #: [id number]

## 2023-02-14 ENCOUNTER — Other Ambulatory Visit: Payer: Self-pay | Admitting: Family Medicine

## 2023-02-14 DIAGNOSIS — E1142 Type 2 diabetes mellitus with diabetic polyneuropathy: Secondary | ICD-10-CM

## 2023-02-15 NOTE — Telephone Encounter (Signed)
Sent. Thanks.   

## 2023-02-27 ENCOUNTER — Telehealth: Payer: Self-pay | Admitting: Family Medicine

## 2023-02-27 NOTE — Telephone Encounter (Signed)
Please call the radiology department at [phone].  The patient has an order for nuclear medicine gastric emptying study.  Please see if the current nuclear medicine shortage is going to affect scheduling this study.  If the shortage will affect the study, please see if radiology suggests an alternative test.  Either way, please let me know.  Thanks.

## 2023-02-28 NOTE — Telephone Encounter (Signed)
Please check with [facility] at [phone].  He lives in [place]. Thanks.

## 2023-02-28 NOTE — Telephone Encounter (Signed)
Called them states that they do not have alternative study at this time. They requested that we call the nuclear medicine at the location to see if they have the isotope available. Do you know what location you are sending to? The number he provided to call are below.   [facility] [phone] [facility] [phone]

## 2023-02-28 NOTE — Telephone Encounter (Signed)
Noted. Thanks. I routed this to referrals for help.  ======== Please see about getting this patient scheduled in [facility] for the nuclear medicine gastric emptying study.

## 2023-02-28 NOTE — Telephone Encounter (Signed)
Called [facility] they would have to have scheduled before they are able to order. They do not see issue in having in supply at this time.

## 2023-03-01 NOTE — Telephone Encounter (Signed)
[name]  You3 hours ago (10:16 AM)   CB I called [name]- left a msg with my ph# as well as [facility] dept numbers to call to schedule. -Carla  ================= Noted. Thanks.

## 2023-03-08 ENCOUNTER — Ambulatory Visit (HOSPITAL_COMMUNITY): Payer: BC Managed Care – PPO

## 2023-03-10 ENCOUNTER — Ambulatory Visit: Payer: BC Managed Care – PPO | Admitting: Family Medicine

## 2023-05-25 ENCOUNTER — Other Ambulatory Visit: Payer: Self-pay | Admitting: Family Medicine

## 2023-05-25 DIAGNOSIS — E1169 Type 2 diabetes mellitus with other specified complication: Secondary | ICD-10-CM

## 2023-06-09 ENCOUNTER — Encounter: Payer: Self-pay | Admitting: Family Medicine

## 2023-06-30 ENCOUNTER — Ambulatory Visit: Payer: Self-pay | Admitting: Family Medicine

## 2023-06-30 NOTE — Telephone Encounter (Signed)
 Noted and thanks.

## 2023-06-30 NOTE — Telephone Encounter (Signed)
  Chief Complaint: Tremors Symptoms: Tremors, leg weakness, slurred speech, "bouncing" Frequency: Once a week Pertinent Negatives: Patient denies numbness, confusion, vision changes, chest pain, difficulty breathing, hemispheric effect, facial droop, head injury Disposition: [] ED /[] Urgent Care (no appt availability in office) / [x] Appointment(In office/virtual)/ []  [name] [facility]/ [] Home Care/ [] Refused Recommended Disposition /[] [name]/ []  Follow-up with PCP Additional Notes: Patient's wife called with complaints of patient having continued bilat hand tremors, slurred speech, and bilat leg weakness x1 month. Patient's wife states that patient started having these symptoms when he started getting ozempic injections a month ago and was switched to Trulicity but symptoms still persists. Patient's wife states that the symptoms will only occur after taking the injection and will last about 45-60 min before they subside. Patient's wife states that she is unable to take his BS during the episode due to the hand tremors and "bouncing" patient will do, but states that patient's normal BS is about 130. Patient does not complain of confusion, vision changes, chest pain, numbness or SOB. Patient's wife states that both sides are affected, patient's legs will become weaker, and symptoms are not seizure-like but worsening. Patient was coming up after just being released from the ER for symptoms. Patient's wife advised by this RN for patient to be seen within 3 days per protocol, to which patient's wife was agreeable. Appt scheduled. Patient's wife advised by this RN to call back with worsening symptoms. Understanding was verbalized.    Copied from CRM [id number]. Topic: Clinical - Red Word Triage >> Jun 30, 2023 12:10 PM Florestine Avers wrote: Red Word that prompted transfer to Nurse Triage: Patients wife called in [facility], on behalf of her husband [name]. She states that the patient is  shaking uncontrollably to the point his job had to call the EMS. Upon arrival to the hospital, his vitals were stable and his blood sugar was 184mg /dL. Patient is having slurred speech and numbness in his hands currently. Patients wife is highly concerned and wants to know what she should do. Reason for Disposition  [1] Loss of speech or garbled speech AND [2] is a chronic symptom (recurrent or ongoing AND present > 4 weeks)  Answer Assessment - Initial Assessment Questions 1. SYMPTOM: "What is the main symptom you are concerned about?" (e.g., weakness, numbness)     Tremors, slurred speech 2. ONSET: "When did this start?" (minutes, hours, days; while sleeping)     One month 3. LAST NORMAL: "When was the last time you (the patient) were normal (no symptoms)?"     Last week.  4. PATTERN "Does this come and go, or has it been constant since it started?"  "Is it present now?"     "It will happen after he takes his Trulicity injection every week. He takes it on Tuesday and I would usually see it later that Tuesday or on Wednesday. It probably last about 45 min to an hour I would say.  He's just now starting to come back to himself now in the car he fell asleep on me."  5. CARDIAC SYMPTOMS: "Have you had any of the following symptoms: chest pain, difficulty breathing, palpitations?"     Denies 6. NEUROLOGIC SYMPTOMS: "Have you had any of the following symptoms: headache, dizziness, vision loss, double vision, changes in speech, unsteady on your feet?"     Slurred speech, tremor "bouncing" 7. OTHER SYMPTOMS: "Do you have any other symptoms?" Denies  Protocols used: Neurologic Deficit-A-AH

## 2023-07-01 ENCOUNTER — Encounter: Payer: Self-pay | Admitting: Family Medicine

## 2023-07-01 ENCOUNTER — Ambulatory Visit (INDEPENDENT_AMBULATORY_CARE_PROVIDER_SITE_OTHER): Payer: Self-pay | Admitting: Family Medicine

## 2023-07-01 ENCOUNTER — Telehealth: Payer: Self-pay | Admitting: Family Medicine

## 2023-07-01 VITALS — BP 110/62 | HR 81 | Temp 97.4°F | Ht 71.0 in | Wt 216.2 lb

## 2023-07-01 DIAGNOSIS — R112 Nausea with vomiting, unspecified: Secondary | ICD-10-CM

## 2023-07-01 DIAGNOSIS — R251 Tremor, unspecified: Secondary | ICD-10-CM

## 2023-07-01 DIAGNOSIS — E1149 Type 2 diabetes mellitus with other diabetic neurological complication: Secondary | ICD-10-CM

## 2023-07-01 LAB — POCT GLYCOSYLATED HEMOGLOBIN (HGB A1C): Hemoglobin A1C: 6.4 % — AB (ref 4.0–5.6)

## 2023-07-01 LAB — CBC WITH DIFFERENTIAL/PLATELET
Basophils Absolute: 0 K/uL (ref 0.0–0.1)
Basophils Relative: 0.5 % (ref 0.0–3.0)
Eosinophils Absolute: 1 K/uL — ABNORMAL HIGH (ref 0.0–0.7)
Eosinophils Relative: 12.4 % — ABNORMAL HIGH (ref 0.0–5.0)
HCT: 38.6 % — ABNORMAL LOW (ref 39.0–52.0)
Hemoglobin: 13.4 g/dL (ref 13.0–17.0)
Lymphocytes Relative: 10.5 % — ABNORMAL LOW (ref 12.0–46.0)
Lymphs Abs: 0.8 K/uL (ref 0.7–4.0)
MCHC: 34.8 g/dL (ref 30.0–36.0)
MCV: 96.3 fl (ref 78.0–100.0)
Monocytes Absolute: 0.5 K/uL (ref 0.1–1.0)
Monocytes Relative: 5.9 % (ref 3.0–12.0)
Neutro Abs: 5.7 K/uL (ref 1.4–7.7)
Neutrophils Relative %: 70.7 % (ref 43.0–77.0)
Platelets: 100 K/uL — ABNORMAL LOW (ref 150.0–400.0)
RBC: 4 Mil/uL — ABNORMAL LOW (ref 4.22–5.81)
RDW: 13.3 % (ref 11.5–15.5)
WBC: 8.1 K/uL (ref 4.0–10.5)

## 2023-07-01 LAB — COMPREHENSIVE METABOLIC PANEL WITH GFR
ALT: 21 U/L (ref 0–53)
AST: 26 U/L (ref 0–37)
Albumin: 3.1 g/dL — ABNORMAL LOW (ref 3.5–5.2)
Alkaline Phosphatase: 137 U/L — ABNORMAL HIGH (ref 39–117)
BUN: 4 mg/dL — ABNORMAL LOW (ref 6–23)
CO2: 30 meq/L (ref 19–32)
Calcium: 8.9 mg/dL (ref 8.4–10.5)
Chloride: 100 meq/L (ref 96–112)
Creatinine, Ser: 1.06 mg/dL (ref 0.40–1.50)
GFR: 79.16 mL/min
Glucose, Bld: 159 mg/dL — ABNORMAL HIGH (ref 70–99)
Potassium: 3.8 meq/L (ref 3.5–5.1)
Sodium: 137 meq/L (ref 135–145)
Total Bilirubin: 1.2 mg/dL (ref 0.2–1.2)
Total Protein: 8 g/dL (ref 6.0–8.3)

## 2023-07-01 LAB — LIPASE: Lipase: 64 U/L — ABNORMAL HIGH (ref 11.0–59.0)

## 2023-07-01 LAB — MICROALBUMIN / CREATININE URINE RATIO
Creatinine,U: 169.4 mg/dL
Microalb Creat Ratio: 70.1 mg/g — ABNORMAL HIGH (ref 0.0–30.0)
Microalb, Ur: 11.9 mg/dL — ABNORMAL HIGH (ref 0.0–1.9)

## 2023-07-01 LAB — TSH: TSH: 0.45 u[IU]/mL (ref 0.35–5.50)

## 2023-07-01 MED ORDER — FREESTYLE LIBRE 3 PLUS SENSOR MISC
11 refills | Status: DC
Start: 2023-07-01 — End: 2023-10-28

## 2023-07-01 NOTE — Progress Notes (Signed)
 Patient ID: [name], male    DOB: [birth year], 58 y.o.   MRN: [id number]  This visit was conducted in person.  BP 110/62 (BP Location: Left Arm, Patient Position: Sitting, Cuff Size: Large)   Pulse 81   Temp (!) 97.4 F (36.3 C) (Temporal)   Ht 5\' 11"  (1.803 m)   Wt 216 lb 4 oz (98.1 kg)   SpO2 99%   BMI 30.16 kg/m    CC:  Chief Complaint  Patient presents with   Tremors    ?Side Effect from the Trulicity   Numbness    Bilateral Hands   Vomiting    Subjective:   HPI: [name] is a 58 y.o. male presenting on 07/01/2023 for Tremors (?Side Effect from the Trulicity), Numbness (Bilateral Hands), and Vomiting   Recent  started Trulicity  0.75 mg ( Had vomiting  with ozempic, could not tolerate)  In last month after talking Trulicity.. 1 day later notes slurred speech, shaking, cold feeling .Marland Kitchen Did not check blood sugar. Not regularly cheking blood sugar.. used to have freestyle.   This week worsening of symptoms after taking Trulicity  EMS called at work yesterday given tremor, weakness, and new issue vomiting.  Ate candy.Marland [facility] [employer]  Has newer job where he is more active. Walking a lot.Marland Kitchen  lost 8 lbs. Lab Results  Component Value Date   HGBA1C 6.4 (A) 07/01/2023   Wt Readings from Last 3 Encounters:  07/01/23 216 lb 4 oz (98.1 kg)  01/06/23 224 lb (101.6 kg)  10/26/22 229 lb (103.9 kg)   Having some upper abdominal pain.Marland Kitchen epigastric pain.. for years... not new      Relevant past medical, surgical, family and social history reviewed and updated as indicated. Interim medical history since our last visit reviewed. Allergies and medications reviewed and updated. Outpatient Medications Prior to Visit  Medication Sig Dispense Refill   gabapentin (NEURONTIN) 600 MG tablet TAKE 2 TABLETS BY MOUTH IN THE MORNING, 2 TABLETS IN THE AFTERNOON, AND 3 TABLETS AT NIGHT 210 tablet 3   ondansetron (ZOFRAN) 4 MG tablet TAKE 1 TABLET BY MOUTH EVERY 8 HOURS AS NEEDED FOR NAUSEA  FOR VOMITING 30 tablet 0   TRULICITY 0.75 MG/0.5ML SOAJ INJECT 0.75MG  INTO THE SKIN ONCE A WEEK 4 mL 0   No facility-administered medications prior to visit.     Per HPI unless specifically indicated in ROS section below Review of Systems  Constitutional:  Negative for fatigue and fever.  HENT:  Negative for ear pain.   Eyes:  Negative for pain.  Respiratory:  Negative for cough and shortness of breath.   Cardiovascular:  Negative for chest pain, palpitations and leg swelling.  Gastrointestinal:  Positive for abdominal pain, nausea and vomiting.  Genitourinary:  Negative for dysuria.  Musculoskeletal:  Negative for arthralgias.  Neurological:  Positive for tremors and weakness. Negative for syncope, light-headedness and headaches.  Psychiatric/Behavioral:  Negative for dysphoric mood.    Objective:  BP 110/62 (BP Location: Left Arm, Patient Position: Sitting, Cuff Size: Large)   Pulse 81   Temp (!) 97.4 F (36.3 C) (Temporal)   Ht 5\' 11"  (1.803 m)   Wt 216 lb 4 oz (98.1 kg)   SpO2 99%   BMI 30.16 kg/m   Wt Readings from Last 3 Encounters:  07/01/23 216 lb 4 oz (98.1 kg)  01/06/23 224 lb (101.6 kg)  10/26/22 229 lb (103.9 kg)      Physical Exam Constitutional:  Appearance: He is well-developed.  HENT:     Head: Normocephalic.     Right Ear: Hearing normal.     Left Ear: Hearing normal.     Nose: Nose normal.  Neck:     Thyroid: No thyroid mass or thyromegaly.     Vascular: No carotid bruit.     Trachea: Trachea normal.  Cardiovascular:     Rate and Rhythm: Normal rate and regular rhythm.     Pulses: Normal pulses.     Heart sounds: Heart sounds not distant. No murmur heard.    No friction rub. No gallop.     Comments: No peripheral edema Pulmonary:     Effort: Pulmonary effort is normal. No respiratory distress.     Breath sounds: Normal breath sounds.  Skin:    General: Skin is warm and dry.     Findings: No rash.  Psychiatric:        Speech: Speech  normal.        Behavior: Behavior normal.        Thought Content: Thought content normal.       Results for orders placed or performed in visit on 07/01/23  POCT glycosylated hemoglobin (Hb A1C)   Collection Time: 07/01/23 11:44 AM  Result Value Ref Range   Hemoglobin A1C 6.4 (A) 4.0 - 5.6 %   HbA1c POC (<> result, manual entry)     HbA1c, POC (prediabetic range)     HbA1c, POC (controlled diabetic range)      Assessment and Plan  Type 2 diabetes mellitus with neurological complications ([name]) Assessment & Plan: Chronic, well-controlled per A1c.  Due for 6-month follow-up with PCP.  Will have the patient return to see PCP as I am holding his Trulicity.  He will follow blood sugars with freestyle libre and may need alternate treatment of diabetes depending on control.  Orders: -     POCT glycosylated hemoglobin (Hb A1C) -     FreeStyle Libre 3 Plus Sensor; Change sensor every 15 days.  Dispense: 2 each; Refill: 11  Tremor Assessment & Plan: Acute episodic spells 1 to 2 days after taking Trulicity.  Given he is more active and has lost weight symptoms sound very consistent with hypoglycemia spells versus Trulicity intolerance. Will evaluate with lab work to rule out other possible causes.  Will have him hold Trulicity. Patient does have some chronic epigastric pain not clearly worse, will evaluate with lipase to rule out pancreatitis on GLP-1 medication.  Encourage patient to use clear liquids and if lab work with negative will advance diet as tolerated.  Can use Zofran as needed for nausea. Follow-up with PCP in 2 weeks for reevaluation.  Orders: -     Comprehensive metabolic panel -     TSH  Nausea and vomiting, unspecified vomiting type -     CBC with Differential/Platelet -     Lipase    Return in about 2 weeks (around 07/15/2023) for  follow up with PCP.   Kerby Nora, MD

## 2023-07-01 NOTE — Telephone Encounter (Signed)
 I need notes sent to me in each individual chart to start the process. Thanks.

## 2023-07-01 NOTE — Telephone Encounter (Signed)
 Message sent in each patient chart.

## 2023-07-01 NOTE — Assessment & Plan Note (Signed)
 Acute episodic spells 1 to 2 days after taking Trulicity.  Given he is more active and has lost weight symptoms sound very consistent with hypoglycemia spells versus Trulicity intolerance. Will evaluate with lab work to rule out other possible causes.  Will have him hold Trulicity. Patient does have some chronic epigastric pain not clearly worse, will evaluate with lipase to rule out pancreatitis on GLP-1 medication.  Encourage patient to use clear liquids and if lab work with negative will advance diet as tolerated.  Can use Zofran as needed for nausea. Follow-up with PCP in 2 weeks for reevaluation.

## 2023-07-01 NOTE — Patient Instructions (Addendum)
 Hold Trulicity. Clear liquid diet, push hydration. Go to emergency room if unable to tolerate liquids. Follow blood sugars with freestyle libre monitor. We will let you know what lab results look like.

## 2023-07-01 NOTE — Assessment & Plan Note (Signed)
 Chronic, well-controlled per A1c.  Due for 49-month follow-up with PCP.  Will have the patient return to see PCP as I am holding his Trulicity.  He will follow blood sugars with freestyle libre and may need alternate treatment of diabetes depending on control.

## 2023-07-01 NOTE — Addendum Note (Signed)
 Addended by: Damita Lack on: 07/01/2023 12:39 PM   Modules accepted: Orders

## 2023-07-01 NOTE — Telephone Encounter (Signed)
 Thank you :)

## 2023-07-01 NOTE — Telephone Encounter (Signed)
 Patient came by office and stated that Dr. Para March will see his wife an d son as new patients. Just wanted to verify before scheduling appointment. Please advise. Thank you!

## 2023-07-05 ENCOUNTER — Encounter: Payer: Self-pay | Admitting: Family Medicine

## 2023-07-15 ENCOUNTER — Ambulatory Visit: Payer: BC Managed Care – PPO | Admitting: Family Medicine

## 2023-07-15 VITALS — BP 132/78 | HR 85 | Temp 98.8°F | Ht 71.0 in | Wt 219.2 lb

## 2023-07-15 DIAGNOSIS — R748 Abnormal levels of other serum enzymes: Secondary | ICD-10-CM

## 2023-07-15 DIAGNOSIS — K7460 Unspecified cirrhosis of liver: Secondary | ICD-10-CM | POA: Diagnosis not present

## 2023-07-15 DIAGNOSIS — D696 Thrombocytopenia, unspecified: Secondary | ICD-10-CM

## 2023-07-15 DIAGNOSIS — E1149 Type 2 diabetes mellitus with other diabetic neurological complication: Secondary | ICD-10-CM

## 2023-07-15 LAB — LIPASE: Lipase: 93 U/L — ABNORMAL HIGH (ref 11.0–59.0)

## 2023-07-15 MED ORDER — LOSARTAN POTASSIUM 25 MG PO TABS: 12.5000 mg | ORAL_TABLET | Freq: Every day | ORAL | 1 refills | Status: AC

## 2023-07-15 MED ORDER — METFORMIN HCL 500 MG PO TABS: 500.0000 mg | ORAL_TABLET | Freq: Two times a day (BID) | ORAL | 1 refills | Status: DC

## 2023-07-15 NOTE — Progress Notes (Signed)
 H/o chronic nausea/vomiting noted by patient.  Sore sensation near the epigastic area.  Pain with eating, with some but not allfoods.  Going on for years.  Predates trulicity use.  He couldn't get coverage for gastric emptying study.  History of cirrhosis noted.  Off trulicity in the meantime.  Recent lipase d/w pt.  Recheck pending.   DM2.  MALB positive, d/w pt.  No meds for sugar now, since off trulicity.  Not yet on ACE/ARB.  Sugar ~200s off trulicity recently.    D/w pt avoiding nsaids and tylenol.    Meds, vitals, and allergies reviewed.   ROS: Per HPI unless specifically indicated in ROS section   Nad Ncat Neck supple, no LA Rrr Ctab Abd not sore now.   Normal BS Skin well perfused.   Diabetic foot exam: Normal inspection except as below.   No skin breakdown B1st MT calluses- d/w pt about tx at home.  Intact DP pulses Normal sensation to light touch and but not monofilament Nails normal

## 2023-07-15 NOTE — Patient Instructions (Addendum)
 You should get a call about seeing GI.   Go to the lab on the way out.   If you have mychart we'll likely use that to update you.    Take care.  Glad to see you. I would try restarting metformin in the meantime.  Start with 1 a day for about 1 week then try to take it twice a day.    Then try adding on losartan.    Recheck here in about 3 months.  Labs at the visit.

## 2023-07-17 DIAGNOSIS — R748 Abnormal levels of other serum enzymes: Secondary | ICD-10-CM | POA: Insufficient documentation

## 2023-07-17 NOTE — Assessment & Plan Note (Addendum)
 I would try restarting metformin in the meantime.  Start with 1 a day for about 1 week then try to take it twice a day.  Stay off Trulicity for now.  Med list updated. Then try adding on losartan.  Routine ACE cautions discussed with patient. Recheck here in about 3 months.  Labs at the visit.  He was not able to afford gastric emptying study.  Referral to GI placed, see other issues.

## 2023-07-17 NOTE — Assessment & Plan Note (Signed)
 Refer to GI

## 2023-07-17 NOTE — Assessment & Plan Note (Signed)
 Likely due to/exacerbated by liver disease.  Refer to GI.

## 2023-07-17 NOTE — Assessment & Plan Note (Signed)
 Recheck pending.  See notes on labs.  Benign abdominal exam.  Would stay off Trulicity for now.

## 2023-07-18 ENCOUNTER — Encounter: Payer: Self-pay | Admitting: Family Medicine

## 2023-07-18 ENCOUNTER — Other Ambulatory Visit: Payer: Self-pay | Admitting: Family Medicine

## 2023-07-18 DIAGNOSIS — R112 Nausea with vomiting, unspecified: Secondary | ICD-10-CM

## 2023-07-18 MED ORDER — ONDANSETRON HCL 4 MG PO TABS: ORAL_TABLET | ORAL | 0 refills | Status: AC

## 2023-07-21 ENCOUNTER — Ambulatory Visit (INDEPENDENT_AMBULATORY_CARE_PROVIDER_SITE_OTHER): Admitting: Family Medicine

## 2023-07-21 ENCOUNTER — Encounter: Payer: Self-pay | Admitting: Family Medicine

## 2023-07-21 VITALS — BP 128/68 | HR 83 | Temp 98.6°F | Ht 71.0 in | Wt 220.8 lb

## 2023-07-21 DIAGNOSIS — R748 Abnormal levels of other serum enzymes: Secondary | ICD-10-CM

## 2023-07-21 DIAGNOSIS — Z7984 Long term (current) use of oral hypoglycemic drugs: Secondary | ICD-10-CM | POA: Diagnosis not present

## 2023-07-21 DIAGNOSIS — E1149 Type 2 diabetes mellitus with other diabetic neurological complication: Secondary | ICD-10-CM | POA: Diagnosis not present

## 2023-07-21 DIAGNOSIS — R0609 Other forms of dyspnea: Secondary | ICD-10-CM

## 2023-07-21 LAB — VITAMIN B12: Vitamin B-12: 375 pg/mL (ref 211–911)

## 2023-07-21 LAB — BRAIN NATRIURETIC PEPTIDE: Pro B Natriuretic peptide (BNP): 17 pg/mL (ref 0.0–100.0)

## 2023-07-21 NOTE — Progress Notes (Signed)
 Zofran helped with nausea.  Still feels diffusely but not focally weak. Working as Investment banker, corporate, walking with work.  He can get SOB on exertion.  Not SOB supine.  No ankle edema.    Balance is affected, unclear if DM2 related.  Discussed.   D/w pt about lipase level.  Discussed checking stool elastase regarding potential pancreatic insufficiency.  He was not able to get gastric emptying study covered by insurance.  Discussed limiting fatty food intake.  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    Meds, vitals, and allergies reviewed.  ROS: Per HPI unless specifically indicated in ROS section   GEN: nad, alert and oriented HEENT: mucous membranes moist NECK: supple w/o LA CV: rrr PULM: ctab, no inc wob ABD: soft, +bs, Ttp at the epigastrum without rebound. EXT: no edema SKIN: Well-perfused

## 2023-07-21 NOTE — Patient Instructions (Addendum)
 Go to the lab on the way out.   If you have mychart we'll likely use that to update you.    Take care.  Glad to see you. Let me know if you don't get a call by the end of next week about seeing the GI clinic.  I would limit fatty foods in the meantime.

## 2023-07-22 ENCOUNTER — Ambulatory Visit: Payer: Self-pay

## 2023-07-22 NOTE — Telephone Encounter (Signed)
 Noted, please let him know I am working on both the GI referral and the meter issue.  I would use zofran as needed but if he has any more vomiting of blood then I would go to ER.  Thanks.

## 2023-07-22 NOTE — Telephone Encounter (Signed)
 Patient notified that Dr. Para March is working on the meter and I sent a message to referral team about the GI referral.    Patient is aware that if he experience any additional blood in his vomit to proceed to the ED

## 2023-07-22 NOTE — Telephone Encounter (Signed)
  Chief Complaint: vomiting blood Symptoms: nausea Frequency: 1 episode last night around 2300 Pertinent Negatives: Patient denies all symptoms at this time Disposition: [x] ED /[] Urgent Care (no appt availability in office) / [] Appointment(In office/virtual)/ []  [place] [facility]/ [] Home Care/ [] Refused Recommended Disposition /[] [place] [facility]/ []  Follow-up with PCP Additional Notes: Patient calls stating that he had one episode of nausea with vomiting last night around 2300 where he noticed "a few tablespoons" of red blood mixed in with other fluids. Patient reports chronic nausea and vomiting, but states he is asymptomatic at this time. Patient states at this time his [name] is not reading and he is unable to check blood sugar manually. Per protocol, patient to present to ED now for evaluation based off symptoms. Patient declines ED, this RN contacted [facility], spoke with Artelia Laroche, RN regarding alternate dispo, patient warm tx to nurse for assistance. Care advice reviewed, Alerting PCP for review.    Copied from CRM [id number]. Topic: Clinical - Red Word Triage >> Jul 22, 2023 12:01 PM Truddie Crumble wrote: Red Word that prompted transfer to Nurse Triage: patient stated he got nauseated last night and started throwing up but at the end he was throwing up blood Reason for Disposition  High-risk adult (e.g., diabetes mellitus, brain tumor, V-P shunt, inguinal hernia, chemotherapy)  Answer Assessment - Initial Assessment Questions 1. APPEARANCE of BLOOD: "What does the blood look like?" (e.g., pink, red blood, coffee-grounds)     Red 2. AMOUNT: "How much blood was lost?" (e.g., few streaks or strands, tablespoon, cup)     A few table spoons 3. VOMITING BLOOD: "How many times did it happen?" or "How many times in the past 24 hours?"     One time 4. VOMITING WITHOUT BLOOD: "How many times in the past 24 hours?"      Once 5. ONSET: "When did vomiting of blood begin?"     Last night around 2300 6.  CAUSE: "What do you think is causing the vomiting of blood?"     Thinks it may be related to the medications he takes 7. BLOOD THINNERS: "Do you take any blood thinners?" (e.g., Coumadin/warfarin, Pradaxa/dabigatran, aspirin)     Denies 8. DEHYDRATION: "Are there any signs of dehydration?" "When was the last time you urinated?" "Do you feel dizzy?"     Denies, feels fine 9. ABDOMEN PAIN: "Are you having any abdomen pain?" If Yes, ask: "What does it feel like? " (e.g., crampy, dull, intermittent, constant)      Denies 10. DIARRHEA: "Is there any diarrhea?" If Yes, ask: "How many times today?"        Denies 11. OTHER SYMPTOMS: "Do you have any other symptoms?" (e.g., fever, blood in stool)       nausea  Protocols used: Vomiting Blood-A-AH

## 2023-07-22 NOTE — Telephone Encounter (Signed)
 I spoke with pt; pt said last night about midnight pt ate a sandwich and then had  one episode of vomiting ; pt strained a lot to vomit and at the end of vomiting pt saw about 2 tablespoon of bright red blood. No abd pain, no diarrhea or constipation; pt is able to pass gas. Pt said his [name] is not getting connection and last cked BS 2 days ago and FBS then was 120. Pt said BS doing good, no symptoms. Offered pt appt to be seen today but pt said he is not having any problems now and only vomited x 1 last night but wanted Dr Para March to be aware and pt wants to see if could get GI referral sooner.. Pt has not heard about appt yet with GI. Pt is going to ck with pharmacy about getting libre sensors. UC & ED precautions given and pt voiced understanding. Sending note to Dr Para March and [place] pool and will teams AV CMA. Pt request cb at [phone] after Dr Para March reviews note.

## 2023-07-24 ENCOUNTER — Encounter: Payer: Self-pay | Admitting: Family Medicine

## 2023-07-24 DIAGNOSIS — R0609 Other forms of dyspnea: Secondary | ICD-10-CM | POA: Insufficient documentation

## 2023-07-24 NOTE — Assessment & Plan Note (Signed)
 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

## 2023-07-24 NOTE — Assessment & Plan Note (Signed)
 Could be multifactorial.  Lungs are clear.  Reasonable to check BNP.  See notes on labs.

## 2023-07-24 NOTE — Assessment & Plan Note (Addendum)
 Unclear if he has clinically significant decrease in pancreatic exocrine function.  Check B12 and pancreatic lipase.  Rationale discussed with patient.  Possibility of chronic pancreatitis discussed with patient, especially with the potential overlap of diabetic gastroparesis.  Continue Zofran as needed.  I need GI input.  Referral placed previously.

## 2023-07-24 NOTE — Telephone Encounter (Signed)
 Noted. Thanks.

## 2023-07-25 ENCOUNTER — Telehealth: Payer: Self-pay

## 2023-07-25 ENCOUNTER — Ambulatory Visit: Admitting: Family Medicine

## 2023-07-25 NOTE — Progress Notes (Signed)
 Care Guide Pharmacy Note  07/25/2023 Name: WILLIA LAMPERT MRN: [id number] DOB: 1965-08-10  Referred By: Joaquim Nam, MD Reason for referral: Care Coordination (Outreach to schedule with Pharm d )   MICHAIL BOYTE is a 58 y.o. year old male who is a primary care patient of Joaquim Nam, MD.  Rick Duff was referred to the pharmacist for assistance related to: DMII  Successful contact was made with the patient to discuss pharmacy services including being ready for the pharmacist to call at least 5 minutes before the scheduled appointment time and to have medication bottles and any blood pressure readings ready for review. The patient agreed to meet with the pharmacist via telephone visit on (date/time).07/27/2023  Penne Lash , RMA     [place]  [facility], [facility] Guide  Direct Dial: [phone]  Website: [email or web address]

## 2023-07-27 ENCOUNTER — Other Ambulatory Visit (INDEPENDENT_AMBULATORY_CARE_PROVIDER_SITE_OTHER)

## 2023-07-27 DIAGNOSIS — E1149 Type 2 diabetes mellitus with other diabetic neurological complication: Secondary | ICD-10-CM

## 2023-07-27 NOTE — Patient Instructions (Addendum)
 Mr. CALDER OBLINGER,   It was a pleasure to speak with you today! As we discussed:?  Please contact the [employer] ([employer] = company that makes [place]). It is usually very easy to request a replacement sensor(s) in the mail for free.  As discussed, you may call or do this online. [email or web address]  What If My Sensor Falls Off or What If My Sensor Isn't Working? Call [employer] Customer Care Team at [phone] Available 7 days a week from 8AM-8PM EST, excluding holidays  How To Share Your Readings With our Clinic Once in the app, go to settings -> connected apps -> LibreView -> Enter Practice ID -> [id number]  Libre Cost: - It looks like your insurance does not pay for sensors unless you are using insulin injections which is pretty common amongst insurance plans.  - Because of this, the cheapest price for the sensors is through the [employer]) copay card which brings the cost down to the ~$75/month that you have been paying. - The other brand of sensors (Dexcom) is much more expensive without insurance coverage and does not have a similar savings program unfortunately.   Please reach out if you have any further questions/concerns regarding your medications or your sensors.   Future Appointments  Date Time Provider Department Center  10/17/2023 10:30 AM Joaquim Nam, MD [facility]   Loree Fee, PharmD Clinical Pharmacist [facility] [phone]

## 2023-07-27 NOTE — Progress Notes (Signed)
 07/27/2023 Name: [name] MRN: [id number] DOB: May 15, 1965  Subjective  Chief Complaint  Patient presents with   Diabetes    Reason for visit: ?  [name] is a 58 y.o. male with a history of diabetes (type 2), who presents today for assistance with CGM setup/function.  Visit with PCP 07/21/23, patient reported [name] was not reading and is unable to check blood sugar manually.   Care Team: Primary Care Provider: Joaquim Nam, MD  Medication Access/Adherence: Prescription drug coverage: Payor: BLUE CROSS BLUE SHIELD / Plan: BCBS COMM PPO / Product Type: *No Product type* / .  - Reports that all medications are affordable. Libre sensors are $70/month which is manageable though not ideal. Patient asks if there is a way to get them cheaper.   Since Last visit / History of Present Illness: ?  Bought 1 month supply. Reports 2 worked fine, 2 did not work. "Cannot connect" error message. Did not contact [employer] support as he was not aware they would replace faulty sensors.   Patient does have a compatible smart phone.  _______________________________________________  Objective     Vitals:  Wt Readings from Last 3 Encounters:  07/21/23 220 lb 12.8 oz (100.2 kg)  07/15/23 219 lb 3.2 oz (99.4 kg)  07/01/23 216 lb 4 oz (98.1 kg)   BP Readings from Last 3 Encounters:  07/21/23 128/68  07/15/23 132/78  07/01/23 110/62   Pulse Readings from Last 3 Encounters:  07/21/23 83  07/15/23 85  07/01/23 81   Labs:?  Lab Results  Component Value Date   HGBA1C 6.4 (A) 07/01/2023   HGBA1C 6.4 (H) 10/26/2022   HGBA1C 5.8 (H) 02/02/2022   GLUCOSE 159 (H) 07/01/2023   MICRALBCREAT 70.1 (H) 07/01/2023   MICRALBCREAT 30-300 09/15/2020   MICRALBCREAT 30-300 11/14/2018   CREATININE 1.06 07/01/2023   CREATININE 0.98 10/26/2022   CREATININE 1.10 02/02/2022   GFR 79.16 07/01/2023   GFR 102.34 05/11/2019    Lab Results  Component Value Date   CHOL 114 10/26/2022   LDLCALC 66  10/26/2022   LDLCALC 67 09/16/2021   LDLCALC 63 09/09/2020   LDLDIRECT 58 05/15/2021   LDLDIRECT 46 11/30/2018   HDL 34 (L) 10/26/2022   TRIG 67 10/26/2022   TRIG 56 09/16/2021   TRIG 77 09/09/2020   ALT 21 07/01/2023   ALT 25 10/26/2022   AST 26 07/01/2023   AST 24 10/26/2022      Chemistry      Component Value Date/Time   NA 137 07/01/2023 1241   NA 138 10/26/2022 0846   K 3.8 07/01/2023 1241   CL 100 07/01/2023 1241   CO2 30 07/01/2023 1241   BUN 4 (L) 07/01/2023 1241   BUN 5 (L) 10/26/2022 0846   CREATININE 1.06 07/01/2023 1241      Component Value Date/Time   CALCIUM 8.9 07/01/2023 1241   ALKPHOS 137 (H) 07/01/2023 1241   AST 26 07/01/2023 1241   ALT 21 07/01/2023 1241   BILITOT 1.2 07/01/2023 1241   BILITOT 0.7 10/26/2022 0846     The ASCVD Risk score (Arnett DK, et al., 2019) failed to calculate for the following reasons:   The valid total cholesterol range is 130 to 320 mg/dL  Assessment and Plan:   1. Continuous Glucose Monitor:  Patient denies issues/concerns with his CGM when it connects correctly, though has had a couple of sensors recently fail to connect, showing an error message. Reports cost of sensors is  too much if they do not work.  Reviewed Libre 3 trouble shooting and provided patient with [name] ([employer]) customer support line/website for replacement sensors. Advised him to keep faulty sensors in case [employer] requests them back. Patient is agreeable. Sensors are delivered within 1-2 days typically.   CGM Cost: CGM is not covered by insurance because patient is not currently using insulin injections  Libre 3 test claim: $74.99 is consistent with what patient is paying and is the same cost as through the copay card.  Follow Up Patient given direct line for questions/concerns regarding his Libre sensors/medication cost   Future Appointments  Date Time Provider Department Center  10/17/2023 10:30 AM Joaquim Nam, MD [facility]   Loree Fee, PharmD Clinical Pharmacist [facility] [phone]

## 2023-08-05 ENCOUNTER — Other Ambulatory Visit: Payer: Self-pay | Admitting: Family Medicine

## 2023-08-05 DIAGNOSIS — E1169 Type 2 diabetes mellitus with other specified complication: Secondary | ICD-10-CM

## 2023-08-05 NOTE — Telephone Encounter (Signed)
 Patient notified that Dr. Para March denied the refill of the trulicity

## 2023-08-05 NOTE — Telephone Encounter (Signed)
 I wouldn't restart.  I denied this.  Thanks.

## 2023-08-05 NOTE — Telephone Encounter (Signed)
 3/14 visit you advised patient to stop Trulicity do you want him to restart.

## 2023-08-28 ENCOUNTER — Other Ambulatory Visit: Payer: Self-pay | Admitting: Family Medicine

## 2023-08-28 DIAGNOSIS — E1142 Type 2 diabetes mellitus with diabetic polyneuropathy: Secondary | ICD-10-CM

## 2023-09-28 ENCOUNTER — Other Ambulatory Visit: Payer: Self-pay | Admitting: Family Medicine

## 2023-09-28 DIAGNOSIS — E1142 Type 2 diabetes mellitus with diabetic polyneuropathy: Secondary | ICD-10-CM

## 2023-10-17 ENCOUNTER — Ambulatory Visit: Admitting: Family Medicine

## 2023-10-25 ENCOUNTER — Ambulatory Visit
Admission: EM | Admit: 2023-10-25 | Discharge: 2023-10-25 | Disposition: A | Discharge status: Home / Self Care | Attending: Family Medicine | Admitting: Family Medicine

## 2023-10-25 ENCOUNTER — Other Ambulatory Visit: Payer: Self-pay

## 2023-10-25 ENCOUNTER — Encounter: Payer: Self-pay | Admitting: Emergency Medicine

## 2023-10-25 DIAGNOSIS — M545 Low back pain, unspecified: Secondary | ICD-10-CM

## 2023-10-25 MED ORDER — PREDNISONE 20 MG PO TABS
40.0000 mg | ORAL_TABLET | Freq: Every day | ORAL | 0 refills | Status: AC
Start: 2023-10-25 — End: 2023-10-30

## 2023-10-25 MED ORDER — TIZANIDINE HCL 4 MG PO TABS: 4.0000 mg | ORAL_TABLET | Freq: Every evening | ORAL | 0 refills | Status: DC | PRN

## 2023-10-25 NOTE — ED Triage Notes (Signed)
 Pt sts right sided lower back and hip pain x 1 month; pt denies obvious injury

## 2023-10-25 NOTE — Discharge Instructions (Signed)
 Take prednisone  20 mg--2 daily for 5 days   Take tizanidine  4 mg--1 every 8 hours as needed for muscle spasms; this medication can cause dizziness and sleepiness  Please follow with your primary care about this problem

## 2023-10-25 NOTE — ED Provider Notes (Signed)
 [facility]    CSN: [id number] Arrival date & time: 10/25/23  1349      History   Chief Complaint Chief Complaint  Patient presents with   Hip Pain    HPI [name] is a 58 y.o. male.    Hip Pain  Here for right low back pain just above his posterior hip that radiates into his right buttock laterally.  It has been bothering him for about a month.  The pain is not constant and only bothers him when he bends or stands up or when he rolls over in the bed.  No trauma or fall  No fever or rash and no dysuria or hematuria.  No bowel or bladder incontinence  He does have a history of sciatica for which she had lumbar surgery in 2012.  He does have neuropathy on both feet.  His diabetes is doing well overall  He is no longer having any abdominal pain that he is off Trulicity .   Past Medical History:  Diagnosis Date   Cirrhosis ([name])    Colon polyps    Complication of anesthesia    Severe panic attack, SOB, but does not have this issue with propofol    Diabetes mellitus without complication ([name])    Esophageal candidiasis ([name])    GERD (gastroesophageal reflux disease)    as a child, some as an adult   Hypertension    Sleep apnea    does not use Cpap   Thrombocytopenia ([name])     Patient Active Problem List   Diagnosis Date Noted   DOE (dyspnea on exertion) 07/24/2023   Elevated lipase 07/17/2023   Tremor 07/01/2023   Cirrhosis of liver ([name]) 01/09/2023   Advance care planning 01/09/2023   Chronic back pain 01/09/2023   S/P amputation of lesser toe, left ([name]) 08/03/2022   Diastasis recti 08/03/2022   Nausea and vomiting 07/16/2021   Chronic constipation 07/16/2021   Thrombocytopenia ([name]) 04/03/2019   Peripheral arterial disease ([name]) 02/15/2019   Persistent proteinuria 11/14/2018   Anorexia 11/14/2018   Diabetic ulcer of toe of right foot associated with diabetes mellitus due to underlying condition, with fat layer exposed ([name])  07/11/2018   Mixed diabetic hyperlipidemia associated with type 2 diabetes mellitus ([name]) 11/16/2017   Vitamin D  deficiency 07/28/2017   Type 2 diabetes mellitus with neurological complications ([name]) 07/28/2017   Diabetic neuropathy, painful ([name]) 06/22/2012   Hypertension associated with diabetes ([name]) 06/22/2012    Past Surgical History:  Procedure Laterality Date   APPENDECTOMY     BACK SURGERY     COLONOSCOPY     COLONOSCOPY WITH PROPOFOL  N/A 07/02/2019   Procedure: COLONOSCOPY WITH PROPOFOL ;  Surgeon: Wilhelmenia Aloha Raddle., MD;  Location: [facility] ENDOSCOPY;  Service: Gastroenterology;  Laterality: N/A;   COLONOSCOPY WITH PROPOFOL  N/A 09/17/2019   Procedure: COLONOSCOPY WITH PROPOFOL ;  Surgeon: Mansouraty, Aloha Raddle., MD;  Location: [facility] ENDOSCOPY;  Service: Gastroenterology;  Laterality: N/A;   ENDOSCOPIC MUCOSAL RESECTION N/A 09/17/2019   Procedure: ENDOSCOPIC MUCOSAL RESECTION;  Surgeon: Wilhelmenia Aloha Raddle., MD;  Location: [facility] ENDOSCOPY;  Service: Gastroenterology;  Laterality: N/A;   HEMOSTASIS CLIP PLACEMENT  09/17/2019   Procedure: HEMOSTASIS CLIP PLACEMENT;  Surgeon: Wilhelmenia Aloha Raddle., MD;  Location: [facility] ENDOSCOPY;  Service: Gastroenterology;;   IR ANGIOGRAM EXTREMITY LEFT  07/24/2020   IR RADIOLOGIST EVAL & MGMT  07/17/2020   IR TIB-PERO ART ATHEREC INC PTA MOD SED  07/24/2020   IR US  GUIDE VASC ACCESS LEFT  07/24/2020  SUBMUCOSAL LIFTING INJECTION  09/17/2019   Procedure: SUBMUCOSAL LIFTING INJECTION;  Surgeon: Wilhelmenia Aloha Raddle., MD;  Location: [facility] ENDOSCOPY;  Service: Gastroenterology;;       Home Medications    Prior to Admission medications   Medication Sig Start Date End Date Taking? Authorizing Provider  predniSONE  (DELTASONE ) 20 MG tablet Take 2 tablets (40 mg total) by mouth daily with breakfast for 5 days. 10/25/23 10/30/23 Yes Vonna Sharlet POUR, MD  tiZANidine  (ZANAFLEX ) 4 MG tablet Take 1 tablet (4 mg total) by mouth at bedtime as needed for muscle spasms.  10/25/23  Yes Vonna Sharlet POUR, MD  Continuous Glucose Sensor (FREESTYLE LIBRE 3 PLUS SENSOR) MISC Change sensor every 15 days. 07/01/23   Bedsole, Amy E, MD  gabapentin  (NEURONTIN ) 600 MG tablet TAKE 2 TABLETS BY MOUTH IN THE MORNING AND 2 IN THE EVENING AND 3 AT BEDTIME 09/30/23   Cleatus Arlyss RAMAN, MD  losartan  (COZAAR ) 25 MG tablet Take 0.5 tablets (12.5 mg total) by mouth daily. 07/15/23   Cleatus Arlyss RAMAN, MD  metFORMIN  (GLUCOPHAGE ) 500 MG tablet Take 1 tablet (500 mg total) by mouth 2 (two) times daily with a meal. 07/15/23   Cleatus Arlyss RAMAN, MD  ondansetron  (ZOFRAN ) 4 MG tablet TAKE 1 TABLET BY MOUTH EVERY 8 HOURS AS NEEDED FOR NAUSEA FOR VOMITING 07/18/23   Cleatus Arlyss RAMAN, MD    Family History Family History  Problem Relation Age of Onset   Brain cancer Mother    Diabetes Father    Breast cancer Sister    Colon cancer Neg Hx    Liver cancer Neg Hx    Esophageal cancer Neg Hx    Inflammatory bowel disease Neg Hx    Liver disease Neg Hx    Pancreatic cancer Neg Hx    Rectal cancer Neg Hx    Stomach cancer Neg Hx    Colon polyps Neg Hx    Prostate cancer Neg Hx     Social History Social History   Tobacco Use   Smoking status: Former    Current packs/day: 0.00    Types: Cigarettes    Quit date: 06/23/1995    Years since quitting: 28.3    Passive exposure: Never   Smokeless tobacco: Current    Types: Chew  Vaping Use   Vaping status: Never Used  Substance Use Topics   Alcohol use: Yes    Comment: rare   Drug use: Yes    Types: Marijuana     Allergies   Other, Ozempic  (0.25 or 0.5 mg-dose) [semaglutide (0.25 or 0.5mg -dos)], and Trulicity  [dulaglutide ]   Review of Systems Review of Systems   Physical Exam Triage Vital Signs ED Triage Vitals [10/25/23 1403]  Encounter Vitals Group     BP (!) 147/76     Girls Systolic BP Percentile      Girls Diastolic BP Percentile      Boys Systolic BP Percentile      Boys Diastolic BP Percentile      Pulse Rate 80      Resp 18     Temp 98.5 F (36.9 C)     Temp Source Oral     SpO2 99 %     Weight      Height      Head Circumference      Peak Flow      Pain Score 5     Pain Loc      Pain Education      Exclude  from Growth Chart    No data found.  Updated Vital Signs BP (!) 147/76 (BP Location: Left Arm)   Pulse 80   Temp 98.5 F (36.9 C) (Oral)   Resp 18   SpO2 99%   Visual Acuity Right Eye Distance:   Left Eye Distance:   Bilateral Distance:    Right Eye Near:   Left Eye Near:    Bilateral Near:     Physical Exam Vitals reviewed.  Constitutional:      General: He is not in acute distress.    Appearance: He is not ill-appearing, toxic-appearing or diaphoretic.   Cardiovascular:     Rate and Rhythm: Normal rate and regular rhythm.   Musculoskeletal:     Comments: Straight leg raise causes increased pulling sensation in the right low back.   Skin:    Coloration: Skin is not pale.   Neurological:     Mental Status: He is alert and oriented to person, place, and time.   Psychiatric:        Behavior: Behavior normal.      UC Treatments / Results  Labs (all labs ordered are listed, but only abnormal results are displayed) Labs Reviewed - No data to display  EKG   Radiology No results found.  Procedures Procedures (including critical care time)  Medications Ordered in UC Medications - No data to display  Initial Impression / Assessment and Plan / UC Course  I have reviewed the triage vital signs and the nursing notes.  Pertinent labs & imaging results that were available during my care of the patient were reviewed by me and considered in my medical decision making (see chart for details).     He states he has been told by his primary care not to take NSAIDs or Tylenol  for pain.  He does have a history of cirrhosis, but his last EGFR was normal.  We decided to do 5 days of prednisone  for inflammation.  He is warned that that will increase his  sugars.  Limited supply of tizanidine  is also sent to the pharmacy for him to use mainly at bedtime.  I have asked him to follow-up with his primary care for any further treatment or referrals needed. Final Clinical Impressions(s) / UC Diagnoses   Final diagnoses:  Acute right-sided low back pain without sciatica     Discharge Instructions      Take prednisone  20 mg--2 daily for 5 days   Take tizanidine  4 mg--1 every 8 hours as needed for muscle spasms; this medication can cause dizziness and sleepiness  Please follow with your primary care about this problem     ED Prescriptions     Medication Sig Dispense Auth. Provider   predniSONE  (DELTASONE ) 20 MG tablet Take 2 tablets (40 mg total) by mouth daily with breakfast for 5 days. 10 tablet Vonna Sharlet POUR, MD   tiZANidine  (ZANAFLEX ) 4 MG tablet Take 1 tablet (4 mg total) by mouth at bedtime as needed for muscle spasms. 10 tablet Vonna Leilanee Righetti K, MD      PDMP not reviewed this encounter.   Vonna Sharlet POUR, MD 10/25/23 [phone]

## 2023-10-27 ENCOUNTER — Telehealth: Payer: Self-pay

## 2023-10-27 NOTE — Telephone Encounter (Signed)
 Copied from CRM [id number]. Topic: Clinical - Medication Question >> Oct 26, 2023  5:13 PM [name] wrote: Reason for CRM: patient was prescribed prescription strength ibuprofen  from urgent care patient would like to ask if it's ok for him to take this medication he states Dr Cleatus gave him a warning about taking ibuprofen  cause of kidney concerns, it was explained this is short term but he still has questions of is it ok to take the medication prescribed by urgent care (pat has appt 10/28/23) Pt num [phone] (M) please call patient to confirm

## 2023-10-27 NOTE — Telephone Encounter (Signed)
 Should be okay to use short term with food.  What is his status about seeing GI?  He had referral prev placed.  Thanks.

## 2023-10-27 NOTE — Telephone Encounter (Signed)
 Patient notified about taking the short term prescription. Also he did get a call from GI about scheduling appointment but he will discuss that more in detail when he comes in tomorrow

## 2023-10-27 NOTE — Telephone Encounter (Signed)
 Noted. Thanks.

## 2023-10-28 ENCOUNTER — Encounter: Payer: Self-pay | Admitting: Family Medicine

## 2023-10-28 ENCOUNTER — Ambulatory Visit (INDEPENDENT_AMBULATORY_CARE_PROVIDER_SITE_OTHER): Admitting: Family Medicine

## 2023-10-28 ENCOUNTER — Ambulatory Visit
Admission: RE | Admit: 2023-10-28 | Discharge: 2023-10-28 | Disposition: A | Discharge status: Home / Self Care | Source: Ambulatory Visit | Attending: Family Medicine | Admitting: Family Medicine

## 2023-10-28 VITALS — BP 128/62 | HR 76 | Temp 98.6°F | Resp 20 | Ht 71.0 in | Wt 228.4 lb

## 2023-10-28 DIAGNOSIS — E1149 Type 2 diabetes mellitus with other diabetic neurological complication: Secondary | ICD-10-CM | POA: Diagnosis not present

## 2023-10-28 DIAGNOSIS — M549 Dorsalgia, unspecified: Secondary | ICD-10-CM | POA: Diagnosis not present

## 2023-10-28 DIAGNOSIS — Z7984 Long term (current) use of oral hypoglycemic drugs: Secondary | ICD-10-CM

## 2023-10-28 DIAGNOSIS — G8929 Other chronic pain: Secondary | ICD-10-CM | POA: Diagnosis not present

## 2023-10-28 NOTE — Progress Notes (Unsigned)
 He isn't on ibuprofen .  Is taking prednisone .  Steroid cautions d/w pt.  He had one dose of prednisone  so far.  Sugar had been running in the green ie controlled on his phone app.  He had trouble getting the pads to stick/work.  D/w pt about using finger prick/meter.   Diabetes:  Using medications without difficulties: yes Hypoglycemic episodes:no Hyperglycemic episodes:no Feet problems: some numbness at baseline.   Blood Sugars averaging: see above.   eye exam within last year: due, d/w pt.   Nausea resolved off trulicity .  Normal stools.  He is working on diet.  He can tolerate metformin .    R sided back pain for about 2 months.  No trigger, no trauma.  Woke up in pain.  No pain sitting.  Pain with movement. R leg can feel tired then have burning and pain.  R buttock pain and pain near lateral R hip flexor origin.  Back surgery hx noted.   He has R>L ankle swelling at baseline.    Pain going up steps at work.    Meds, vitals, and allergies reviewed.   ROS: Per HPI unless specifically indicated in ROS section   GEN: nad, alert and oriented HEENT: mucous membranes moist NECK: supple w/o LA CV: rrr. PULM: ctab, no inc wob ABD: soft, +bs EXT: R>L ankle swelling at baseline.   SKIN: no acute rash L 2nd toe amputation. Strength grossly intact with bilateral lower extremities.  Diabetic foot exam: Normal inspection No skin breakdown Callus R 1st ray.   Normal DP pulses dec sensation to light touch and absent to monofilament Nails normal  31 minutes were devoted to patient care in this encounter (this includes time spent reviewing the patient's file/history, interviewing and examining the patient, counseling/reviewing plan with patient).

## 2023-10-28 NOTE — Patient Instructions (Addendum)
 Call about an eye exam when possible.  Take care.  Glad to see you. Xray on the way out.  Finish the prednisone  and let me know if that isn't helping.  Out of work while on prednisone .  Recheck A1c at a visit in about 3 months.

## 2023-10-30 ENCOUNTER — Telehealth: Payer: Self-pay | Admitting: Family Medicine

## 2023-10-30 DIAGNOSIS — M545 Low back pain, unspecified: Secondary | ICD-10-CM

## 2023-10-30 NOTE — Telephone Encounter (Signed)
 Please call patient.  I am awaiting the overread from radiology about his back films.  I do not see an obvious fracture but he does have degenerative changes noted.  If he is not improving by the end of the prednisone  course then please let me know.  Thanks.

## 2023-10-30 NOTE — Assessment & Plan Note (Signed)
 I asked him to about an eye exam when possible.  Previous GI symptoms improved off Trulicity .  Added to intolerant list.  Continue metformin . Recheck A1c at a visit in about 3 months.

## 2023-10-30 NOTE — Assessment & Plan Note (Signed)
 See notes on imaging. He will finish the prednisone  and let me know if that isn't helping.  Out of work while on prednisone .  He agrees with plan.

## 2023-10-31 NOTE — Telephone Encounter (Unsigned)
 Copied from CRM [id number]. Topic: Clinical - Lab/Test Results >> Oct 31, 2023 10:54 AM [name] wrote: Reason for CRM: Patient is calling to go over his xray results.

## 2023-11-01 NOTE — Telephone Encounter (Signed)
 Advised patient of his result. Today he took the last of the prednisone  and he notes no changes. Things are not better. He is agreeable to another visit in office if you think he should be seen. Please advise

## 2023-11-01 NOTE — Telephone Encounter (Signed)
 Left voicemail for patient to return call to office.

## 2023-11-02 MED ORDER — HYDROCODONE-ACETAMINOPHEN 5-325 MG PO TABS: 0.5000 | ORAL_TABLET | Freq: Three times a day (TID) | ORAL | 0 refills | Status: DC | PRN

## 2023-11-02 NOTE — Telephone Encounter (Signed)
 If not any better, then I would try short course hydrocodone  with sedation caution, set up MRI spine and refer to spine clinic.    I put in the orders for all 3.  Glad to see patient back at clinic but I would proceed with all of that in the meantime.

## 2023-11-02 NOTE — Addendum Note (Signed)
 Addended by: CLEATUS ARLYSS RAMAN on: 11/02/2023 04:11 PM   Modules accepted: Orders

## 2023-11-03 ENCOUNTER — Encounter: Payer: Self-pay | Admitting: Family Medicine

## 2023-11-03 ENCOUNTER — Other Ambulatory Visit: Payer: Self-pay | Admitting: Family Medicine

## 2023-11-06 ENCOUNTER — Ambulatory Visit: Payer: Self-pay | Admitting: Family Medicine

## 2023-11-07 ENCOUNTER — Ambulatory Visit
Admission: RE | Admit: 2023-11-07 | Discharge: 2023-11-07 | Disposition: A | Discharge status: Home / Self Care | Source: Ambulatory Visit | Attending: Family Medicine | Admitting: Family Medicine

## 2023-11-07 DIAGNOSIS — M545 Low back pain, unspecified: Secondary | ICD-10-CM

## 2023-11-07 DIAGNOSIS — M48061 Spinal stenosis, lumbar region without neurogenic claudication: Secondary | ICD-10-CM | POA: Diagnosis not present

## 2023-11-07 NOTE — Telephone Encounter (Signed)
 Called patient has MRI for tonight. If he has not heard from spine doctor in the next week he will let us  know.

## 2023-11-09 ENCOUNTER — Other Ambulatory Visit: Payer: Self-pay | Admitting: Family Medicine

## 2023-11-09 ENCOUNTER — Encounter: Payer: Self-pay | Admitting: Physician Assistant

## 2023-11-09 DIAGNOSIS — E1142 Type 2 diabetes mellitus with diabetic polyneuropathy: Secondary | ICD-10-CM

## 2023-11-09 NOTE — Progress Notes (Signed)
 Referring Physician:  Cleatus Arlyss RAMAN, MD [address] [place],  [place] [address]  Primary Physician:  Cleatus Arlyss RAMAN, MD  History of Present Illness: 11/15/23 Mr. Gilles Trimpe has chronic neuropathy and previous lumbar surgery, comes today for back pain with associated right leg weakness and numbness.  He is concerned because he started stumbling while walking and is having issues going up and down the stairs.  He denies any pain in his leg however feels as though it is primarily weakness and numbness.  He feels as though his right leg is heavy and he is unable to walk a longer distance.  He has noticed that leaning forward helps with his pain.  No saddle anesthesia or issues with incontinence.   Bowel/Bladder Dysfunction: none  Conservative measures:  Physical therapy:  has not participated in PT Multimodal medical therapy including regular antiinflammatories:  Tizanidine , Prednisone , Gabapentin  Injections:  no epidural steroid injections  Past Surgery:  history of back surgery on L5-S1 2009  Wadie CROME Cowper has no symptoms of cervical myelopathy.  The symptoms are causing a significant impact on the patient's life.   Review of Systems:  A 10 point review of systems is negative, except for the pertinent positives and negatives detailed in the HPI.  Past Medical History: Past Medical History:  Diagnosis Date   Cirrhosis ([name])    Colon polyps    Complication of anesthesia    Severe panic attack, SOB, but does not have this issue with propofol    Diabetes mellitus without complication ([name])    Esophageal candidiasis ([name])    GERD (gastroesophageal reflux disease)    as a child, some as an adult   Hypertension    Sleep apnea    does not use Cpap   Thrombocytopenia ([name])     Past Surgical History: Past Surgical History:  Procedure Laterality Date   APPENDECTOMY     BACK SURGERY     COLONOSCOPY     COLONOSCOPY WITH PROPOFOL  N/A 07/02/2019   Procedure:  COLONOSCOPY WITH PROPOFOL ;  Surgeon: Mansouraty, Aloha Raddle., MD;  Location: [facility] ENDOSCOPY;  Service: Gastroenterology;  Laterality: N/A;   COLONOSCOPY WITH PROPOFOL  N/A 09/17/2019   Procedure: COLONOSCOPY WITH PROPOFOL ;  Surgeon: Mansouraty, Aloha Raddle., MD;  Location: [facility] ENDOSCOPY;  Service: Gastroenterology;  Laterality: N/A;   ENDOSCOPIC MUCOSAL RESECTION N/A 09/17/2019   Procedure: ENDOSCOPIC MUCOSAL RESECTION;  Surgeon: Wilhelmenia Aloha Raddle., MD;  Location: [facility] ENDOSCOPY;  Service: Gastroenterology;  Laterality: N/A;   HEMOSTASIS CLIP PLACEMENT  09/17/2019   Procedure: HEMOSTASIS CLIP PLACEMENT;  Surgeon: Wilhelmenia Aloha Raddle., MD;  Location: [facility] ENDOSCOPY;  Service: Gastroenterology;;   IR ANGIOGRAM EXTREMITY LEFT  07/24/2020   IR RADIOLOGIST EVAL & MGMT  07/17/2020   IR TIB-PERO ART ATHEREC INC PTA MOD SED  07/24/2020   IR US  GUIDE VASC ACCESS LEFT  07/24/2020   SUBMUCOSAL LIFTING INJECTION  09/17/2019   Procedure: SUBMUCOSAL LIFTING INJECTION;  Surgeon: Wilhelmenia Aloha Raddle., MD;  Location: [facility] ENDOSCOPY;  Service: Gastroenterology;;    Allergies: Allergies as of 11/15/2023 - Review Complete 10/28/2023  Allergen Reaction Noted   Other  01/09/2023   Ozempic  (0.25 or 0.5 mg-dose) [semaglutide (0.25 or 0.5mg -dos)]  07/15/2023   Trulicity  [dulaglutide ]  07/15/2023    Medications: Outpatient Encounter Medications as of 11/15/2023  Medication Sig   gabapentin  (NEURONTIN ) 600 MG tablet TAKE 2 TABLETS BY MOUTH IN THE MORNING AND 2 IN THE EVENING AND 3 AT BEDTIME   HYDROcodone -acetaminophen  (NORCO/VICODIN) 5-325 MG  tablet Take 0.5-1 tablets by mouth 3 (three) times daily as needed for moderate pain (pain score 4-6).   losartan  (COZAAR ) 25 MG tablet Take 0.5 tablets (12.5 mg total) by mouth daily.   metFORMIN  (GLUCOPHAGE ) 500 MG tablet Take 1 tablet (500 mg total) by mouth 2 (two) times daily with a meal.   ondansetron  (ZOFRAN ) 4 MG tablet TAKE 1 TABLET BY MOUTH EVERY 8 HOURS AS NEEDED FOR  NAUSEA FOR VOMITING   tiZANidine  (ZANAFLEX ) 4 MG tablet Take 1 tablet (4 mg total) by mouth at bedtime as needed for muscle spasms.   No facility-administered encounter medications on file as of 11/15/2023.    Social History: Social History   Tobacco Use   Smoking status: Former    Current packs/day: 0.00    Types: Cigarettes    Quit date: 06/23/1995    Years since quitting: 28.4    Passive exposure: Never   Smokeless tobacco: Current    Types: Chew  Vaping Use   Vaping status: Never Used  Substance Use Topics   Alcohol use: Yes    Comment: rare   Drug use: Yes    Types: Marijuana    Family Medical History: Family History  Problem Relation Age of Onset   Brain cancer Mother    Diabetes Father    Breast cancer Sister    Colon cancer Neg Hx    Liver cancer Neg Hx    Esophageal cancer Neg Hx    Inflammatory bowel disease Neg Hx    Liver disease Neg Hx    Pancreatic cancer Neg Hx    Rectal cancer Neg Hx    Stomach cancer Neg Hx    Colon polyps Neg Hx    Prostate cancer Neg Hx     Physical Examination: @VITALWITHPAIN @  General: Patient is well developed, well nourished, calm, collected, and in no apparent distress. Attention to examination is appropriate.  Psychiatric: Patient is non-anxious.  Head:  Pupils equal, round, and reactive to light.  ENT:  Oral mucosa appears well hydrated.  Neck:   Supple.  Full range of motion.  Respiratory: Patient is breathing without any difficulty.  Extremities: No edema.  Vascular: Palpable dorsal pedal pulses.  Skin:   On exposed skin, there are no abnormal skin lesions.  NEUROLOGICAL:     Awake, alert, oriented to person, place, and time.  Speech is clear and fluent. Fund of knowledge is appropriate.   Cranial Nerves: Pupils equal round and reactive to light.  Facial tone is symmetric.  Facial sensation is symmetric.  ROM of spine: Minimal tenderness palpation of his lumbar paraspinals.   Strength:  Side  Iliopsoas Quads Hamstring PF DF EHL  R 4+ 5 5 5 5 5   L 5 5 5 5 5 5   Negative straight leg raise. Reflexes are 2+ and symmetric at the  patella and achilles.   Clonus is not present.  Toes are down-going.  Bilateral upper and lower extremity sensation is intact to light touch.    Gait is normal.   No difficulty with tandem gait.   No evidence of dysmetria noted.  Medical Decision Making  Imaging: MRI Lumbar spine IMPRESSION:  1. At L4-L5, progressive severe canal stenosis. Similar left greater  than right moderate foraminal stenosis.  2. At L2-L3, progressive moderate canal stenosis with moderate left  and mild right foraminal stenosis.  3. 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      I  have personally reviewed the images and agree with the above interpretation.  Assessment and Plan: Mr. Knee is a pleasant 58 y.o. male with chronic neuropath and previous lumbar surgery, coming in today for new back pain with associated right leg heaviness, weakness, numbness.  He denies any pain that radiates from his back into his leg, however is concerned secondary to the weakness.  This is affecting his daily living.  On examination he has some slight right hip flexion weakness.  Otherwise has full strength.  Findings show progressive severe canal stenosis at L4-5 and moderate stenosis at L2-3.  Plan moving forward includes the following:  -Evaluation by physical therapy, referral sent. - 4 view of lumbar spine to be completed today to evaluate for dynamic listhesis - Patient would like to try a potential injection for his pain.  I have placed a referral for this. - Plan to see back in 8 weeks with Dr. Claudene.     Thank you for involving me in the care of this patient.   I spent a total of 30 minutes in both face-to-face and non-face-to-face activities for this visit on the date of this encounter including preparing to see the patient, obtaining  and reviewing history, performing examination, counseling the patient, ordering additional tests, placing referrals,documenting clinical information  Lyle Decamp, PA-C Dept. of Neurosurgery

## 2023-11-13 ENCOUNTER — Ambulatory Visit: Payer: Self-pay | Admitting: Family Medicine

## 2023-11-15 ENCOUNTER — Encounter: Payer: Self-pay | Admitting: Physician Assistant

## 2023-11-15 ENCOUNTER — Ambulatory Visit (INDEPENDENT_AMBULATORY_CARE_PROVIDER_SITE_OTHER): Admitting: Physician Assistant

## 2023-11-15 VITALS — BP 152/70 | Ht 71.0 in | Wt 226.0 lb

## 2023-11-15 DIAGNOSIS — M48061 Spinal stenosis, lumbar region without neurogenic claudication: Secondary | ICD-10-CM

## 2023-11-18 ENCOUNTER — Telehealth: Payer: Self-pay

## 2023-11-18 NOTE — Telephone Encounter (Signed)
 Copied from CRM [id number]. Topic: General - Other >> Nov 17, 2023  3:53 PM Aisha D wrote: Reason for CRM: Pt stated that he will be at the office tomorrow morning to drop off some paperwork from his job that he needs Dr.Duncan to sign.

## 2023-11-18 NOTE — Telephone Encounter (Signed)
 Reached out to patient to advise that he can pick up paperwork but he would need to sign the documents so that we can have our copies. Patient will come pick up Monday

## 2023-11-23 ENCOUNTER — Telehealth: Payer: Self-pay | Admitting: Family Medicine

## 2023-11-23 NOTE — Telephone Encounter (Signed)
 Received ppwk form [name] HR department relating to his accomodation request placed in providers box for review/completion.

## 2023-11-23 NOTE — Telephone Encounter (Signed)
Placed in provider tray.

## 2023-11-27 NOTE — Telephone Encounter (Signed)
 I will work on then when possible/when back in office. Thanks.

## 2023-11-29 ENCOUNTER — Ambulatory Visit (INDEPENDENT_AMBULATORY_CARE_PROVIDER_SITE_OTHER): Admitting: Family Medicine

## 2023-11-29 VITALS — BP 120/60 | HR 71 | Temp 98.9°F | Ht 71.0 in | Wt 224.6 lb

## 2023-11-29 DIAGNOSIS — E1140 Type 2 diabetes mellitus with diabetic neuropathy, unspecified: Secondary | ICD-10-CM

## 2023-11-29 DIAGNOSIS — M4800 Spinal stenosis, site unspecified: Secondary | ICD-10-CM

## 2023-11-29 DIAGNOSIS — E1149 Type 2 diabetes mellitus with other diabetic neurological complication: Secondary | ICD-10-CM | POA: Diagnosis not present

## 2023-11-29 DIAGNOSIS — M545 Low back pain, unspecified: Secondary | ICD-10-CM

## 2023-11-29 DIAGNOSIS — Z7984 Long term (current) use of oral hypoglycemic drugs: Secondary | ICD-10-CM

## 2023-11-29 MED ORDER — HYDROCODONE-ACETAMINOPHEN 5-325 MG PO TABS: 0.5000 | ORAL_TABLET | Freq: Three times a day (TID) | ORAL | 0 refills | Status: AC | PRN

## 2023-11-29 MED ORDER — FREESTYLE LIBRE 3 PLUS SENSOR MISC: 11 refills | Status: AC

## 2023-11-29 MED ORDER — TIZANIDINE HCL 2 MG PO TABS: 2.0000 mg | ORAL_TABLET | Freq: Three times a day (TID) | ORAL | 2 refills | Status: DC | PRN

## 2023-11-29 NOTE — Patient Instructions (Addendum)
 Please call about PT if you don't get contacted soon.  Update me as needed.  Take care.  Glad to see you. Sedation caution on meds.

## 2023-11-29 NOTE — Progress Notes (Unsigned)
 More back pain standing.  Less sitting.  Previous MRI results and neurosurgery consult note discussed with patient. D/w pt about tizanidine  vs hydrocodone  use.  Both helped some.  D/w pt about dosing options.  Discussed his gait abnormality with right hip flexor weakness and discussed how that could affect his ability to complete work responsibilities.  I had filled out paperwork for him regarding that.  We reviewed the paperwork and he agreed with my assessment.  Please see scanned forms.  Discussed rationale for physical therapy and trying to avoid surgery.  Gabapentin  helped foot pain but not his back pain.    Sugar has been ~130s recently.  Rx sent for sensors.    Meds, vitals, and allergies reviewed.   ROS: Per HPI unless specifically indicated in ROS section   Nad Ncat Neck supple, no LA Rrr Ctab Abd soft.  Nontender. R hip flexor weaker than L.  Able to bear weight but with limp from pain.

## 2023-11-30 DIAGNOSIS — M4800 Spinal stenosis, site unspecified: Secondary | ICD-10-CM | POA: Insufficient documentation

## 2023-11-30 DIAGNOSIS — M48062 Spinal stenosis, lumbar region with neurogenic claudication: Secondary | ICD-10-CM | POA: Insufficient documentation

## 2023-11-30 NOTE — Assessment & Plan Note (Addendum)
 Discussed rationale for physical therapy and trying to avoid surgery. Continue with gabapentin  for his foot pain though that is not helping his back much.  He can try using tizanidine  versus hydrocodone  with sedation caution.  Routine cautions given to patient.  I asked him to call about physical therapy if he is not contacted soon about starting that.  See scanned forms.

## 2023-11-30 NOTE — Assessment & Plan Note (Signed)
 Continue gabapentin . Sugar has been ~130s recently.  Rx sent for sensors.

## 2023-12-02 ENCOUNTER — Telehealth: Payer: Self-pay

## 2023-12-02 ENCOUNTER — Other Ambulatory Visit (HOSPITAL_COMMUNITY): Payer: Self-pay

## 2023-12-02 NOTE — Telephone Encounter (Signed)
 Pharmacy Patient Advocate Encounter   Received notification from CoverMyMeds that prior authorization for HYDROcodone -Acetaminophen  5-325MG  tablets is required/requested.   Insurance verification completed.   The patient is insured through [facility] .   Per test claim: The current 7 day co-pay is, $1.65.  No PA needed at this time. This test claim was processed through [facility]- copay amounts may vary at other pharmacies due to pharmacy/plan contracts, or as the patient moves through the different stages of their insurance plan.

## 2023-12-15 ENCOUNTER — Encounter: Payer: Self-pay | Admitting: Student in an Organized Health Care Education/Training Program

## 2023-12-15 ENCOUNTER — Ambulatory Visit
Attending: Student in an Organized Health Care Education/Training Program | Admitting: Student in an Organized Health Care Education/Training Program

## 2023-12-15 VITALS — BP 160/79 | HR 74 | Temp 97.8°F | Resp 16 | Ht 70.0 in

## 2023-12-15 DIAGNOSIS — M1711 Unilateral primary osteoarthritis, right knee: Secondary | ICD-10-CM | POA: Diagnosis not present

## 2023-12-15 DIAGNOSIS — M5416 Radiculopathy, lumbar region: Secondary | ICD-10-CM | POA: Diagnosis not present

## 2023-12-15 DIAGNOSIS — M25561 Pain in right knee: Secondary | ICD-10-CM | POA: Insufficient documentation

## 2023-12-15 DIAGNOSIS — M48062 Spinal stenosis, lumbar region with neurogenic claudication: Secondary | ICD-10-CM | POA: Diagnosis not present

## 2023-12-15 DIAGNOSIS — G8929 Other chronic pain: Secondary | ICD-10-CM | POA: Diagnosis not present

## 2023-12-15 NOTE — Progress Notes (Signed)
 Safety precautions to be maintained throughout the outpatient stay will include: orient to surroundings, keep bed in low position, maintain call bell within reach at all times, provide assistance with transfer out of bed and ambulation.

## 2023-12-15 NOTE — Progress Notes (Signed)
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 toe of right foot associated with diabetes mellitus due to underlying condition, with fat layer exposed ([name]) 07/11/2018   Persistent proteinuria 11/14/2018   Anorexia 11/14/2018   Peripheral arterial disease ([name]) 02/15/2019   Thrombocytopenia ([name]) 04/03/2019   Nausea and vomiting 07/16/2021   Chronic constipation 07/16/2021   S/P amputation of lesser toe, left ([name]) 08/03/2022   Diastasis recti 08/03/2022   Cirrhosis of liver ([name]) 01/09/2023   Advance care planning 01/09/2023   Chronic back pain 01/09/2023   Tremor 07/01/2023   Elevated lipase 07/17/2023   DOE (dyspnea on exertion) 07/24/2023   Spinal stenosis, lumbar region, with neurogenic claudication 11/30/2023   Lumbar radiculopathy 12/15/2023   Chronic pain of  right knee 12/15/2023   Primary osteoarthritis of right knee 12/15/2023   Resolved Ambulatory Problems    Diagnosis Date Noted   Pancreatitis, acute 06/22/2012   Nausea with vomiting 06/22/2012   Abdominal pain, epigastric 06/22/2012   Acute hyperkalemia 06/22/2012   Leukocytosis 06/22/2012   H/O noncompliance with medical treatment, presenting hazards to health 07/28/2017   Morbidly obese ([name]) 07/28/2017   Healthcare maintenance 07/11/2018   Low TSH level 11/14/2018   High risk medications (not anticoagulants) long-term use 11/14/2018   Noncompliance with diet and medication regimen 11/14/2018   Hiatal hernia 11/14/2018   Swelling of both lower extremities 05/29/2019   Cecal polyp 06/02/2019   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   Abnormal colonoscopy 06/02/2019   Hx of adenomatous colonic polyps 07/16/2021   Long term current use of antithrombotics/antiplatelets 07/16/2021   Intercostal muscle strain 10/26/2022   Past Medical History:  Diagnosis Date   Cirrhosis ([name])    Colon polyps    Complication of anesthesia    Diabetes mellitus without complication ([name])    Esophageal candidiasis ([name])    GERD (gastroesophageal reflux disease)    Hypertension    Sleep apnea    Constitutional Exam  General appearance: Well nourished, well developed, and well hydrated. In no apparent acute distress Vitals:   12/15/23 1457  BP: (!) 160/79  Pulse: 74  Resp: 16  Temp: 97.8 F (36.6 C)  SpO2: 100%  Height: 5'  10 (1.778 m)   BMI Assessment: Estimated body mass index is 32.23 kg/m as calculated from the following:   Height as of this encounter: 5' 10 (1.778 m).   Weight as of 11/29/23: 224 lb 9.6 oz (101.9 kg).  BMI interpretation table: BMI level Category Range association with higher incidence of chronic pain  <18 kg/m2 Underweight   18.5-24.9 kg/m2 Ideal body weight   25-29.9 kg/m2 Overweight Increased incidence by 20%  30-34.9 kg/m2 Obese (Class I) Increased  incidence by 68%  35-39.9 kg/m2 Severe obesity (Class II) Increased incidence by 136%  >40 kg/m2 Extreme obesity (Class III) Increased incidence by 254%   Patient's current BMI Ideal Body weight  Body mass index is 32.23 kg/m. Patient weight not recorded   BMI Readings from Last 4 Encounters:  12/15/23 32.23 kg/m  11/29/23 31.33 kg/m  11/15/23 31.52 kg/m  10/28/23 31.85 kg/m   Wt Readings from Last 4 Encounters:  11/29/23 224 lb 9.6 oz (101.9 kg)  11/15/23 226 lb (102.5 kg)  10/28/23 228 lb 6 oz (103.6 kg)  07/21/23 220 lb 12.8 oz (100.2 kg)    Psych/Mental status: Alert, oriented x 3 (person, place, & time)       Eyes: PERLA Respiratory: No evidence of acute respiratory distress  Lumbar Spine Area Exam  Skin & Axial Inspection: No masses, redness, or swelling Alignment: Symmetrical Functional ROM: Unrestricted ROM       Stability: No instability detected Muscle Tone/Strength: Functionally intact. No obvious neuro-muscular anomalies detected. Sensory (Neurological): Unimpaired Palpation: No palpable anomalies       Provocative Tests: Hyperextension/rotation test: deferred today       Lumbar quadrant test (Kemp's test): deferred today       Lateral bending test: deferred today       Patrick's Maneuver: deferred today                   FABER* test: deferred today                   S-I anterior distraction/compression test: deferred today         S-I lateral compression test: deferred today         S-I Thigh-thrust test: deferred today         S-I Gaenslen's test: deferred today         *(Flexion, ABduction and External Rotation) Gait & Posture Assessment  Ambulation: Unassisted Gait: Relatively normal for age and body habitus Posture: WNL  Lower Extremity Exam    Side: Right lower extremity  Side: Left lower extremity  Stability: No instability observed          Stability: No instability observed          Skin & Extremity Inspection: Skin color, temperature, and  hair growth are WNL. No peripheral edema or cyanosis. No masses, redness, swelling, asymmetry, or associated skin lesions. No contractures.  Skin & Extremity Inspection: Skin color, temperature, and hair growth are WNL. No peripheral edema or cyanosis. No masses, redness, swelling, asymmetry, or associated skin lesions. No contractures.  Functional ROM: Unrestricted ROM                  Functional ROM: Unrestricted ROM                  Muscle Tone/Strength: Functionally intact. No obvious neuro-muscular anomalies detected.  Muscle Tone/Strength: Functionally intact. No obvious neuro-muscular anomalies detected.  Sensory (Neurological): Unimpaired  Sensory (Neurological): Unimpaired        DTR: Patellar: deferred today Achilles: deferred today Plantar: deferred today  DTR: Patellar: deferred today Achilles: deferred today Plantar: deferred today  Palpation: No palpable anomalies  Palpation: No palpable anomalies    Assessment  Primary Diagnosis & Pertinent Problem List: The primary encounter diagnosis was Spinal stenosis, lumbar region, with neurogenic claudication. Diagnoses of Chronic radicular lumbar pain, Lumbar radiculopathy, Chronic pain of right knee, and Primary osteoarthritis of right knee were also pertinent to this visit.  Visit Diagnosis (New problems to examiner): 1. Spinal stenosis, lumbar region, with neurogenic claudication   2. Chronic radicular lumbar pain   3. Lumbar radiculopathy   4. Chronic pain of right knee   5. Primary osteoarthritis of right knee    Plan of Care (Initial workup plan)  Assessment and Plan    Severe lumbar spinal stenosis at L4-L5 with lumbar disc herniation, status post prior surgery, and associated right-sided radiculopathy and lumbar facet arthropathy He has severe lumbar spinal stenosis at L4-L5 with lumbar disc herniation and right-sided radiculopathy, following spine surgery in 2010. Symptoms include low back pain radiating to the  right buttock, worsened by standing and relieved by bending forward. Lumbar facet arthropathy at the same level contributes to the pain. He is not interested in further surgery. Current medications, including gabapentin, hydrocodone, and a muscle relaxant, provide limited relief. Schedule an epidural steroid injection at L4-L5 with Valium for relaxation. Advise him to stop eating 6 hours before the procedure and bring a driver.   Right knee pain, etiology to be determined   He experiences right knee pain, possibly from overcompensating for back pain, with onset in the last month. The pain significantly impairs walking, and no prior x-rays have been done. An x-ray will be obtained to determine the cause. Consider cortisone or gel injection based on x-ray findings, but avoid same-day steroid injection due to diabetes.  Diabetes mellitus   He has diabetes mellitus, which may be affected by steroid use. Exercise caution with steroid injections due to potential impacts on blood glucose levels.      Future considerations: L-MBNB, MILD, SCS trial  Imaging Orders         DG Knee Complete 4 Views Right      Procedure Orders         Lumbar Epidural Injection       Provider-requested follow-up: Return in about 27 days (around 01/11/2024) for Right L4-5 ESI , in clinic (PO Valium 5mg ) .  Future Appointments  Date Time Provider Department Center  01/09/2024  9:45 AM Claudene Penne ORN, MD [facility] None   I discussed the assessment and treatment plan with the patient. The patient was provided an opportunity to ask questions and all were answered. The patient agreed with the plan and demonstrated an understanding of the instructions.  Patient advised to call back or seek an in-person evaluation if the symptoms or condition worsens.  Duration of encounter: .  Total time on encounter, as per AMA guidelines included both the face-to-face and non-face-to-face time personally spent by the physician  and/or other qualified health care professional(s) on the day of the encounter (includes time in activities that require the physician or other qualified health care professional and does not include time in activities normally performed by clinical staff). Physician's time may include the following activities when performed: Preparing to see the patient (e.g., pre-charting review of records, searching for previously ordered imaging, lab work, and nerve conduction  tests) Review of prior analgesic pharmacotherapies. Reviewing PMP Interpreting ordered tests (e.g., lab work, imaging, nerve conduction tests) Performing post-procedure evaluations, including interpretation of diagnostic procedures Obtaining and/or reviewing separately obtained history Performing a medically appropriate examination and/or evaluation Counseling and educating the patient/family/caregiver Ordering medications, tests, or procedures Referring and communicating with other health care professionals (when not separately reported) Documenting clinical information in the electronic or other health record Independently interpreting results (not separately reported) and communicating results to the patient/ family/caregiver Care coordination (not separately reported)  Note by: Wallie Sherry, MD (TTS and AI technology used. I apologize for any typographical errors that were not detected and corrected.) Date: 12/15/2023; Time: 3:53 PM

## 2023-12-15 NOTE — Patient Instructions (Signed)

## 2024-01-06 NOTE — Progress Notes (Deleted)
 Referring Physician:  Cleatus Arlyss RAMAN, MD [address] [place],  [place] [address]  Primary Physician:  Cleatus Arlyss RAMAN, MD  History of Present Illness: 01/06/2024 Mr. Teryl Mcconaghy is here today with a chief complaint of ***  Back pain, right leg feels heavy/weak. He also has numbness in the right leg.  Has he started PT?  Scheduled for injection with Dr. Marcelino on 01/11/24  Wadie LITTIE Ka has ***no symptoms of cervical myelopathy.  The symptoms are causing a significant impact on the patient's life.   I have utilized the care everywhere function in epic to review the outside records available from external health systems.  Progress Note from [place], [place] on 11/15/23:  History of Present Illness: 11/15/23 Mr. Jamarl Pew has chronic neuropathy and previous lumbar surgery, comes today for back pain with associated right leg weakness and numbness.  He is concerned because he started stumbling while walking and is having issues going up and down the stairs.  He denies any pain in his leg however feels as though it is primarily weakness and numbness.  He feels as though his right leg is heavy and he is unable to walk a longer distance.  He has noticed that leaning forward helps with his pain.  No saddle anesthesia or issues with incontinence.     Bowel/Bladder Dysfunction: none   Conservative measures:  Physical therapy:  has not participated in PT Multimodal medical therapy including regular antiinflammatories:  Tizanidine , Prednisone , Gabapentin  Injections:  no epidural steroid injections   Past Surgery:  history of back surgery on L5-S1 2009  Review of Systems:  A 10 point review of systems is negative, except for the pertinent positives and negatives detailed in the HPI.  Past Medical History: Past Medical History:  Diagnosis Date   Cirrhosis ([name])    Colon polyps    Complication of anesthesia    Severe panic attack, SOB, but does not have this issue with  propofol    Diabetes mellitus without complication ([name])    Esophageal candidiasis ([name])    GERD (gastroesophageal reflux disease)    as a child, some as an adult   Hypertension    Sleep apnea    does not use Cpap   Thrombocytopenia ([name])     Past Surgical History: Past Surgical History:  Procedure Laterality Date   APPENDECTOMY     BACK SURGERY     COLONOSCOPY     COLONOSCOPY WITH PROPOFOL  N/A 07/02/2019   Procedure: COLONOSCOPY WITH PROPOFOL ;  Surgeon: Mansouraty, Aloha Raddle., MD;  Location[facility] ENDOSCOPY;  Service: Gastroenterology;  Laterality: N/A;   COLONOSCOPY WITH PROPOFOL  N/A 09/17/2019   Procedure: COLONOSCOPY WITH PROPOFOL ;  Surgeon: Wilhelmenia Aloha Raddle., MD;  Location: [facility] ENDOSCOPY;  Service: Gastroenterology;  Laterality: N/A;   ENDOSCOPIC MUCOSAL RESECTION N/A 09/17/2019   Procedure: ENDOSCOPIC MUCOSAL RESECTION;  Surgeon: Wilhelmenia Aloha Raddle., MD;  Location: [facility] ENDOSCOPY;  Service: Gastroenterology;  Laterality: N/A;   HEMOSTASIS CLIP PLACEMENT  09/17/2019   Procedure: HEMOSTASIS CLIP PLACEMENT;  Surgeon: Wilhelmenia Aloha Raddle., MD;  Location: [facility] ENDOSCOPY;  Service: Gastroenterology;;   IR ANGIOGRAM EXTREMITY LEFT  07/24/2020   IR RADIOLOGIST EVAL & MGMT  07/17/2020   IR TIB-PERO ART ATHEREC INC PTA MOD SED  07/24/2020   IR US  GUIDE VASC ACCESS LEFT  07/24/2020   SUBMUCOSAL LIFTING INJECTION  09/17/2019   Procedure: SUBMUCOSAL LIFTING INJECTION;  Surgeon: Wilhelmenia Aloha Raddle., MD;  Location: [facility] ENDOSCOPY;  Service: Gastroenterology;;  Allergies: Allergies as of 01/09/2024 - Review Complete 12/15/2023  Allergen Reaction Noted   Other  01/09/2023   Ozempic  (0.25 or 0.5 mg-dose) [semaglutide (0.25 or 0.5mg -dos)]  07/15/2023   Trulicity  [dulaglutide ]  07/15/2023    Medications:  Current Outpatient Medications:    Continuous Glucose Sensor (FREESTYLE LIBRE 3 PLUS SENSOR) MISC, Change sensor every 15 days., Disp: 2 each, Rfl: 11   gabapentin  (NEURONTIN )  600 MG tablet, TAKE 2 TABLETS BY MOUTH IN THE MORNING AND 2 IN THE EVENING AND 3 AT BEDTIME, Disp: 210 tablet, Rfl: 1   HYDROcodone -acetaminophen  (NORCO/VICODIN) 5-325 MG tablet, Take 0.5-1 tablets by mouth 3 (three) times daily as needed for moderate pain (pain score 4-6). (Patient taking differently: Take by mouth 3 (three) times daily as needed for moderate pain (pain score 4-6). Takes PRN for increased pain), Disp: 20 tablet, Rfl: 0   losartan  (COZAAR ) 25 MG tablet, Take 0.5 tablets (12.5 mg total) by mouth daily., Disp: 45 tablet, Rfl: 1   metFORMIN  (GLUCOPHAGE ) 500 MG tablet, Take 1 tablet (500 mg total) by mouth 2 (two) times daily with a meal., Disp: 180 tablet, Rfl: 1   ondansetron  (ZOFRAN ) 4 MG tablet, TAKE 1 TABLET BY MOUTH EVERY 8 HOURS AS NEEDED FOR NAUSEA FOR VOMITING, Disp: 30 tablet, Rfl: 0   tiZANidine  (ZANAFLEX ) 2 MG tablet, Take 1-2 tablets (2-4 mg total) by mouth every 8 (eight) hours as needed for muscle spasms (sedation caution)., Disp: 40 tablet, Rfl: 2  Social History: Social History   Tobacco Use   Smoking status: Former    Current packs/day: 0.00    Types: Cigarettes    Quit date: 06/23/1995    Years since quitting: 28.5    Passive exposure: Never   Smokeless tobacco: Current    Types: Chew  Vaping Use   Vaping status: Never Used  Substance Use Topics   Alcohol use: Yes    Comment: rare   Drug use: Yes    Types: Marijuana    Family Medical History: Family History  Problem Relation Age of Onset   Brain cancer Mother    Diabetes Father    Breast cancer Sister    Colon cancer Neg Hx    Liver cancer Neg Hx    Esophageal cancer Neg Hx    Inflammatory bowel disease Neg Hx    Liver disease Neg Hx    Pancreatic cancer Neg Hx    Rectal cancer Neg Hx    Stomach cancer Neg Hx    Colon polyps Neg Hx    Prostate cancer Neg Hx     Physical Examination: There were no vitals filed for this visit.  General: Patient is in no apparent distress. Attention to  examination is appropriate.  Neck:   Supple.  Full range of motion.  Respiratory: Patient is breathing without any difficulty.   NEUROLOGICAL:     Awake, alert, oriented to person, place, and time.  Speech is clear and fluent.   Cranial Nerves: Pupils equal round and reactive to light.  Facial tone is symmetric.  Facial sensation is symmetric. Shoulder shrug is symmetric. Tongue protrusion is midline.    Strength: Side Biceps Triceps Deltoid Interossei Grip Wrist Ext. Wrist Flex.  R 5 5 5 5 5 5 5   L 5 5 5 5 5 5 5    Side Iliopsoas Quads Hamstring PF DF EHL  R 5 5 5 5 5 5   L 5 5 5 5 5 5    Reflexes are ***2+ and  symmetric at the biceps, triceps, brachioradialis, patella and achilles.   Hoffman's is absent. Clonus is absent  Bilateral upper and lower extremity sensation is intact to light touch ***.     No evidence of dysmetria noted.  Gait is normal.    Imaging: *** I have personally reviewed the images and agree with the above interpretation.  Medical Decision Making/Assessment and Plan: Mr. Vanderheiden is a pleasant 58 y.o. male with ***  There are no diagnoses linked to this encounter.   Thank you for involving me in the care of this patient.    Penne MICAEL Sharps MD/MSCR Neurosurgery

## 2024-01-09 ENCOUNTER — Ambulatory Visit: Admitting: Neurosurgery

## 2024-01-11 ENCOUNTER — Ambulatory Visit (HOSPITAL_BASED_OUTPATIENT_CLINIC_OR_DEPARTMENT_OTHER): Admitting: Student in an Organized Health Care Education/Training Program

## 2024-01-11 ENCOUNTER — Encounter: Payer: Self-pay | Admitting: Student in an Organized Health Care Education/Training Program

## 2024-01-11 ENCOUNTER — Ambulatory Visit
Admission: RE | Admit: 2024-01-11 | Discharge: 2024-01-11 | Disposition: A | Discharge status: Home / Self Care | Source: Ambulatory Visit | Attending: Student in an Organized Health Care Education/Training Program | Admitting: Student in an Organized Health Care Education/Training Program

## 2024-01-11 DIAGNOSIS — M48062 Spinal stenosis, lumbar region with neurogenic claudication: Secondary | ICD-10-CM | POA: Diagnosis not present

## 2024-01-11 DIAGNOSIS — M5416 Radiculopathy, lumbar region: Secondary | ICD-10-CM | POA: Insufficient documentation

## 2024-01-11 DIAGNOSIS — G8929 Other chronic pain: Secondary | ICD-10-CM | POA: Diagnosis not present

## 2024-01-11 MED ORDER — IOHEXOL 180 MG/ML  SOLN
10.0000 mL | Freq: Once | INTRAMUSCULAR | Status: AC
  Administered 2024-01-11: 10 mL via EPIDURAL

## 2024-01-11 MED ORDER — DEXAMETHASONE SODIUM PHOSPHATE 10 MG/ML IJ SOLN
INTRAMUSCULAR | Status: AC
  Filled 2024-01-11: qty 1

## 2024-01-11 MED ORDER — ROPIVACAINE HCL 2 MG/ML IJ SOLN
2.0000 mL | Freq: Once | INTRAMUSCULAR | Status: AC
  Administered 2024-01-11: 20 mL via EPIDURAL

## 2024-01-11 MED ORDER — LIDOCAINE HCL (PF) 2 % IJ SOLN
INTRAMUSCULAR | Status: AC
  Filled 2024-01-11: qty 5

## 2024-01-11 MED ORDER — LIDOCAINE HCL 2 % IJ SOLN
20.0000 mL | Freq: Once | INTRAMUSCULAR | Status: AC
  Administered 2024-01-11: 100 mg

## 2024-01-11 MED ORDER — DIAZEPAM 5 MG PO TABS
5.0000 mg | ORAL_TABLET | ORAL | Status: AC
  Administered 2024-01-11: 5 mg via ORAL

## 2024-01-11 MED ORDER — SODIUM CHLORIDE 0.9% FLUSH
2.0000 mL | Freq: Once | INTRAVENOUS | Status: AC
  Administered 2024-01-11: 10 mL

## 2024-01-11 MED ORDER — DIAZEPAM 5 MG PO TABS
ORAL_TABLET | ORAL | Status: AC
  Filled 2024-01-11: qty 1

## 2024-01-11 MED ORDER — IOHEXOL 180 MG/ML  SOLN
INTRAMUSCULAR | Status: AC
  Filled 2024-01-11: qty 20

## 2024-01-11 MED ORDER — ROPIVACAINE HCL 2 MG/ML IJ SOLN
INTRAMUSCULAR | Status: AC
  Filled 2024-01-11: qty 20

## 2024-01-11 MED ORDER — DEXAMETHASONE SODIUM PHOSPHATE 10 MG/ML IJ SOLN
10.0000 mg | Freq: Once | INTRAMUSCULAR | Status: AC
  Administered 2024-01-11: 10 mg

## 2024-01-11 MED ORDER — SODIUM CHLORIDE (PF) 0.9 % IJ SOLN
INTRAMUSCULAR | Status: AC
  Filled 2024-01-11: qty 10

## 2024-01-11 NOTE — Progress Notes (Signed)
 Safety precautions to be maintained throughout the outpatient stay will include: orient to surroundings, keep bed in low position, maintain call bell within reach at all times, provide assistance with transfer out of bed and ambulation.

## 2024-01-11 NOTE — Patient Instructions (Signed)

## 2024-01-11 NOTE — Progress Notes (Signed)
 PROVIDER NOTE: Interpretation of information contained herein should be left to medically-trained personnel. Specific patient instructions are provided elsewhere under Patient Instructions section of medical record. This document was created in part using STT-dictation technology, any transcriptional errors that may result from this process are unintentional.  Patient: [name] Type: Established DOB: 1966/01/05 MRN: [id number] PCP: Cleatus Arlyss RAMAN, MD  Service: Procedure DOS: 01/11/2024 Setting: Ambulatory Location: Ambulatory outpatient facility Delivery: Face-to-face Provider: Wallie Sherry, MD Specialty: Interventional Pain Management Specialty designation: 09 Location: Outpatient facility Ref. Prov.: Sherry Wallie, MD       Interventional Therapy   Type: Lumbar epidural steroid injection (LESI) (interlaminar) #1  Laterality: Right   Level:  L4-5 Level.  Imaging: Fluoroscopic guidance         Anesthesia: Local anesthesia (1-2% Lidocaine ) Sedation: Minimal Sedation                       DOS: 01/11/2024  Performed by: Wallie Sherry, MD  Purpose: Diagnostic/Therapeutic Indications: Lumbar radicular pain of intraspinal etiology of more than 4 weeks that has failed to respond to conservative therapy and is severe enough to impact quality of life or function. 1. Spinal stenosis, lumbar region, with neurogenic claudication   2. Chronic radicular lumbar pain   3. Lumbar radiculopathy    NAS-11 Pain score:   Pre-procedure: 5 /10   Post-procedure: 7 /10      Position / Prep / Materials:  Position: Prone w/ head of the table raised (slight reverse trendelenburg) to facilitate breathing.  Prep solution: ChloraPrep (2% chlorhexidine  gluconate and 70% isopropyl alcohol) Prep Area: Entire Posterior Lumbar Region from lower scapular tip down to mid buttocks area and from flank to flank. Materials:  Tray: Epidural tray Needle(s):  Type: Epidural needle (Tuohy) Gauge (G):   22 Length: Regular (3.5-in) Qty: 1  H&P (Pre-op Assessment):  [name] is a 58 y.o. (year old), male patient, seen today for interventional treatment. He  has a past surgical history that includes Appendectomy; Back surgery; Colonoscopy; Colonoscopy with propofol  (N/A, 07/02/2019); Colonoscopy with propofol  (N/A, 09/17/2019); Endoscopic mucosal resection (N/A, 09/17/2019); Submucosal lifting injection (09/17/2019); Hemostasis clip placement (09/17/2019); IR Radiologist Eval & Mgmt (07/17/2020); IR US  Guide Vasc Access Left (07/24/2020); IR TIB-PERO ART ATHEREC INC PTA MOD SED (07/24/2020); and IR Angiogram Extremity Left (07/24/2020). [name] has a current medication list which includes the following prescription(s): freestyle libre 3 plus sensor, gabapentin , hydrocodone -acetaminophen , losartan , metformin , ondansetron , and tizanidine . His primarily concern today is the Back Pain  Initial Vital Signs:  Pulse/HCG Rate: 74ECG Heart Rate: 87 Temp: (!) 97.4 F (36.3 C) Resp: 16 BP: (!) 150/74 SpO2: 100 %  BMI: Estimated body mass index is 30.41 kg/m as calculated from the following:   Height as of this encounter: 5' 10.5 (1.791 m).   Weight as of this encounter: 215 lb (97.5 kg).  Risk Assessment: Allergies: Reviewed. He is allergic to other, ozempic  (0.25 or 0.5 mg-dose) [semaglutide (0.25 or 0.5mg -dos)], and trulicity  [dulaglutide ].  Allergy Precautions: None required Coagulopathies: Reviewed. None identified.  Blood-thinner therapy: None at this time Active Infection(s): Reviewed. None identified. [name] is afebrile  Site Confirmation: [name] was asked to confirm the procedure and laterality before marking the site Procedure checklist: Completed Consent: Before the procedure and under the influence of no sedative(s), amnesic(s), or anxiolytics, the patient was informed of the treatment options, risks and possible complications. To fulfill our ethical and legal obligations, as  recommended by the American  Medical Association's Code of Ethics, I have informed the patient of my clinical impression; the nature and purpose of the treatment or procedure; the risks, benefits, and possible complications of the intervention; the alternatives, including doing nothing; the risk(s) and benefit(s) of the alternative treatment(s) or procedure(s); and the risk(s) and benefit(s) of doing nothing. The patient was provided information about the general risks and possible complications associated with the procedure. These may include, but are not limited to: failure to achieve desired goals, infection, bleeding, organ or nerve damage, allergic reactions, paralysis, and death. In addition, the patient was informed of those risks and complications associated to Spine-related procedures, such as failure to decrease pain; infection (i.e.: Meningitis, epidural or intraspinal abscess); bleeding (i.e.: epidural hematoma, subarachnoid hemorrhage, or any other type of intraspinal or peri-dural bleeding); organ or nerve damage (i.e.: Any type of peripheral nerve, nerve root, or spinal cord injury) with subsequent damage to sensory, motor, and/or autonomic systems, resulting in permanent pain, numbness, and/or weakness of one or several areas of the body; allergic reactions; (i.e.: anaphylactic reaction); and/or death. Furthermore, the patient was informed of those risks and complications associated with the medications. These include, but are not limited to: allergic reactions (i.e.: anaphylactic or anaphylactoid reaction(s)); adrenal axis suppression; blood sugar elevation that in diabetics may result in ketoacidosis or comma; water retention that in patients with history of congestive heart failure may result in shortness of breath, pulmonary edema, and decompensation with resultant heart failure; weight gain; swelling or edema; medication-induced neural toxicity; particulate matter embolism and blood vessel  occlusion with resultant organ, and/or nervous system infarction; and/or aseptic necrosis of one or more joints. Finally, the patient was informed that Medicine is not an exact science; therefore, there is also the possibility of unforeseen or unpredictable risks and/or possible complications that may result in a catastrophic outcome. The patient indicated having understood very clearly. We have given the patient no guarantees and we have made no promises. Enough time was given to the patient to ask questions, all of which were answered to the patient's satisfaction. Mr. Renbarger has indicated that he wanted to continue with the procedure. Attestation: I, the ordering provider, attest that I have discussed with the patient the benefits, risks, side-effects, alternatives, likelihood of achieving goals, and potential problems during recovery for the procedure that I have provided informed consent. Date  Time: 01/11/2024 11:38 AM  Pre-Procedure Preparation:  Monitoring: As per clinic protocol. Respiration, ETCO2, SpO2, BP, heart rate and rhythm monitor placed and checked for adequate function Safety Precautions: Patient was assessed for positional comfort and pressure points before starting the procedure. Time-out: I initiated and conducted the Time-out before starting the procedure, as per protocol. The patient was asked to participate by confirming the accuracy of the Time Out information. Verification of the correct person, site, and procedure were performed and confirmed by me, the nursing staff, and the patient. Time-out conducted as per Joint Commission's Universal Protocol (UP.01.01.01). Time: 1207 Start Time: 1208 hrs.  Description/Narrative of Procedure:          Target: Epidural space via interlaminar opening, initially targeting the lower laminar border of the superior vertebral body. Region: Lumbar Approach: Percutaneous paravertebral  Rationale (medical necessity): procedure needed and  proper for the diagnosis and/or treatment of the patient's medical symptoms and needs. Procedural Technique Safety Precautions: Aspiration looking for blood return was conducted prior to all injections. At no point did we inject any substances, as a needle was being advanced. No attempts  were made at seeking any paresthesias. Safe injection practices and needle disposal techniques used. Medications properly checked for expiration dates. SDV (single dose vial) medications used. Description of the Procedure: Protocol guidelines were followed. The procedure needle was introduced through the skin, ipsilateral to the reported pain, and advanced to the target area. Bone was contacted and the needle walked caudad, until the lamina was cleared. The epidural space was identified using "loss-of-resistance technique" with 2-3 ml of PF-NaCl (0.9% NSS), in a 5cc LOR glass syringe.  Vitals:   01/11/24 1148 01/11/24 1210 01/11/24 1214  BP: (!) 150/74 (!) 157/91 (!) 154/98  Pulse: 74    Resp: 16 19 16   Temp: (!) 97.4 F (36.3 C)    SpO2: 100% 100% 100%  Weight: 215 lb (97.5 kg)    Height: 5' 10.5 (1.791 m)      Start Time: 1208 hrs. End Time: 1212 hrs.  Imaging Guidance (Spinal):          Type of Imaging Technique: Fluoroscopy Guidance (Spinal) Indication(s): Fluoroscopy guidance for needle placement to enhance accuracy in procedures requiring precise needle localization for targeted delivery of medication in or near specific anatomical locations not easily accessible without such real-time imaging assistance. Exposure Time: Please see nurses notes. Contrast: Before injecting any contrast, we confirmed that the patient did not have an allergy to iodine, shellfish, or radiological contrast. Once satisfactory needle placement was completed at the desired level, radiological contrast was injected. Contrast injected under live fluoroscopy. No contrast complications. See chart for type and volume of contrast  used. Fluoroscopic Guidance: I was personally present during the use of fluoroscopy. Tunnel Vision Technique used to obtain the best possible view of the target area. Parallax error corrected before commencing the procedure. Direction-depth-direction technique used to introduce the needle under continuous pulsed fluoroscopy. Once target was reached, antero-posterior, oblique, and lateral fluoroscopic projection used confirm needle placement in all planes. Images permanently stored in EMR. Interpretation: I personally interpreted the imaging intraoperatively. Adequate needle placement confirmed in multiple planes. Appropriate spread of contrast into desired area was observed. No evidence of afferent or efferent intravascular uptake. No intrathecal or subarachnoid spread observed. Permanent images saved into the patient's record.  Antibiotic Prophylaxis:   Anti-infectives (From admission, onward)    None      Indication(s): None identified  Post-operative Assessment:  Post-procedure Vital Signs:  Pulse/HCG Rate: 7487 Temp: (!) 97.4 F (36.3 C) Resp: 16 BP: (!) 154/98 SpO2: 100 %  EBL: None  Complications: No immediate post-treatment complications observed by team, or reported by patient.  Note: The patient tolerated the entire procedure well. A repeat set of vitals were taken after the procedure and the patient was kept under observation following institutional policy, for this type of procedure. Post-procedural neurological assessment was performed, showing return to baseline, prior to discharge. The patient was provided with post-procedure discharge instructions, including a section on how to identify potential problems. Should any problems arise concerning this procedure, the patient was given instructions to immediately contact us , at any time, without hesitation. In any case, we plan to contact the patient by telephone for a follow-up status report regarding this interventional  procedure.  Comments:  No additional relevant information.  Plan of Care (POC)  Orders:  Orders Placed This Encounter  Procedures   DG PAIN CLINIC C-ARM 1-60 MIN NO REPORT    Intraoperative interpretation by procedural physician at [facility].    Standing Status:   Standing    Number  of Occurrences:   1    Reason for exam::   Assistance in needle guidance and placement for procedures requiring needle placement in or near specific anatomical locations not easily accessible without such assistance.     Medications ordered for procedure: Meds ordered this encounter  Medications   iohexol  (OMNIPAQUE ) 180 MG/ML injection 10 mL    Must be Myelogram-compatible. If not available, you may substitute with a water-soluble, non-ionic, hypoallergenic, myelogram-compatible radiological contrast medium.   lidocaine  (XYLOCAINE ) 2 % (with pres) injection 400 mg   diazepam  (VALIUM ) tablet 5 mg    Make sure Flumazenil is available in the pyxis when using this medication. If oversedation occurs, administer 0.2 mg IV over 15 sec. If after 45 sec no response, administer 0.2 mg again over 1 min; may repeat at 1 min intervals; not to exceed 4 doses (1 mg)   ropivacaine  (PF) 2 mg/mL (0.2%) (NAROPIN ) injection 2 mL   sodium chloride  flush (NS) 0.9 % injection 2 mL   dexamethasone  (DECADRON ) injection 10 mg   Medications administered: We administered iohexol , lidocaine , diazepam , ropivacaine  (PF) 2 mg/mL (0.2%), sodium chloride  flush, and dexamethasone .  See the medical record for exact dosing, route, and time of administration.    Right L4-L5 ESI 01/11/2024    Follow-up plan:   Return in about 3 weeks (around 02/01/2024) for PPE, F2F.     Recent Visits Date Type Provider Dept  12/15/23 Office Visit Marcelino Nurse, MD [facility]  Showing recent visits within past 90 days and meeting all other requirements Today's Visits Date Type Provider Dept  01/11/24 Procedure visit Marcelino Nurse, MD [facility]  Showing today's visits and meeting all other requirements Future Appointments Date Type Provider Dept  02/01/24 Appointment Marcelino Nurse, MD [facility]  Showing future appointments within next 90 days and meeting all other requirements   Disposition: Discharge home  Discharge (Date  Time): 01/11/2024; 1220 hrs.   Primary Care Physician: Cleatus Arlyss RAMAN, MD Location: [facility] Note by: Nurse Marcelino, MD (TTS technology used. I apologize for any typographical errors that were not detected and corrected.) Date: 01/11/2024; Time: 12:25 PM  Disclaimer:  Medicine is not an Visual merchandiser. The only guarantee in medicine is that nothing is guaranteed. It is important to note that the decision to proceed with this intervention was based on the information collected from the patient. The Data and conclusions were drawn from the patient's questionnaire, the interview, and the physical examination. Because the information was provided in large part by the patient, it cannot be guaranteed that it has not been purposely or unconsciously manipulated. Every effort has been made to obtain as much relevant data as possible for this evaluation. It is important to note that the conclusions that lead to this procedure are derived in large part from the available data. Always take into account that the treatment will also be dependent on availability of resources and existing treatment guidelines, considered by other Pain Management Practitioners as being common knowledge and practice, at the time of the intervention. For Medico-Legal purposes, it is also important to point out that variation in procedural techniques and pharmacological choices are the acceptable norm. The indications, contraindications, technique, and results of the above procedure should only be interpreted and judged by a Board-Certified Interventional Pain Specialist with  extensive familiarity and expertise in the same exact procedure and technique.

## 2024-01-12 ENCOUNTER — Telehealth: Payer: Self-pay | Admitting: *Deleted

## 2024-01-12 NOTE — Telephone Encounter (Signed)
 Post procedure call; reports that back feels a whole lot better.  Asked to please keep up with diary so that we are able to know exactly how things went and how to proceed in plan of care.

## 2024-01-27 ENCOUNTER — Other Ambulatory Visit: Payer: Self-pay | Admitting: Family Medicine

## 2024-01-27 DIAGNOSIS — E1142 Type 2 diabetes mellitus with diabetic polyneuropathy: Secondary | ICD-10-CM

## 2024-01-27 NOTE — Telephone Encounter (Signed)
 Copied from CRM [id number]. Topic: Clinical - Medication Refill >> Jan 27, 2024  5:09 PM Chiquita SQUIBB wrote: Medication: gabapentin  gabapentin  (NEURONTIN ) 600 MG tablet   Has the patient contacted their pharmacy? Yes (Agent: If no, request that the patient contact the pharmacy for the refill. If patient does not wish to contact the pharmacy document the reason why and proceed with request.) (Agent: If yes, when and what did the pharmacy advise?)  This is the patient's preferred pharmacy:  [facility] [address] [place], [place] - [address] [place] [address] Phone: [phone] Fax: [phone]  Is this the correct pharmacy for this prescription? Yes If no, delete pharmacy and type the correct one.   Has the prescription been filled recently? No  Is the patient out of the medication? Yes  Has the patient been seen for an appointment in the last year OR does the patient have an upcoming appointment? Yes  Can we respond through MyChart? Yes  Agent: Please be advised that Rx refills may take up to 3 business days. We ask that you follow-up with your pharmacy.

## 2024-01-30 MED ORDER — GABAPENTIN 600 MG PO TABS: ORAL_TABLET | ORAL | 1 refills | Status: DC

## 2024-01-30 NOTE — Telephone Encounter (Signed)
 LOV: 11/29/2023 NOV: nothing scheduled  Last Refill: gabapentin  (NEURONTIN ) 600 MG tablet  11/09/23 210 tablets 1 refill

## 2024-02-01 ENCOUNTER — Encounter: Payer: Self-pay | Admitting: Student in an Organized Health Care Education/Training Program

## 2024-02-01 ENCOUNTER — Ambulatory Visit
Attending: Student in an Organized Health Care Education/Training Program | Admitting: Student in an Organized Health Care Education/Training Program

## 2024-02-01 VITALS — BP 173/83 | HR 87 | Temp 97.5°F | Resp 14 | Ht 71.0 in | Wt 210.0 lb

## 2024-02-01 DIAGNOSIS — M5416 Radiculopathy, lumbar region: Secondary | ICD-10-CM | POA: Diagnosis present

## 2024-02-01 DIAGNOSIS — G8929 Other chronic pain: Secondary | ICD-10-CM | POA: Diagnosis not present

## 2024-02-01 DIAGNOSIS — M48062 Spinal stenosis, lumbar region with neurogenic claudication: Secondary | ICD-10-CM | POA: Insufficient documentation

## 2024-02-01 NOTE — Patient Instructions (Signed)

## 2024-02-01 NOTE — Progress Notes (Signed)
 PROVIDER NOTE: Interpretation of information contained herein should be left to medically-trained personnel. Specific patient instructions are provided elsewhere under Patient Instructions section of medical record. This document was created in part using AI and STT-dictation technology, any transcriptional errors that may result from this process are unintentional.  Patient: [name]  Service: E/M   PCP: Cleatus Arlyss RAMAN, MD  DOB: 1965/05/08  DOS: 02/01/2024  Provider: Wallie Sherry, MD  MRN: [id number]  Delivery: Face-to-face  Specialty: Interventional Pain Management  Type: Established Patient  Setting: Ambulatory outpatient facility  Specialty designation: 09  Referring Prov.: Cleatus Arlyss RAMAN, MD  Location: Outpatient office facility       History of present illness (HPI) [name], a 58 y.o. 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 Mr. Pro primary complain today is Back Pain (lower)   Pain Assessment: Severity of Chronic pain is reported as a 0-No pain/10. Location: Back Lower/denies. Onset: More than a month ago. Quality: Aching. Timing: Intermittent. Modifying factor(s):  SABRA Vitals:  height is 5' 11 (1.803 m) and weight is 210 lb (95.3 kg). His temporal temperature is 97.5 F (36.4 C) (abnormal). His blood pressure is 173/83 (abnormal) and his pulse is 87. His respiration is 14 and oxygen saturation is 100%.  BMI: Estimated body mass index is 29.29 kg/m as calculated from the following:   Height as of this encounter: 5' 11 (1.803 m).   Weight as of this encounter: 210 lb (95.3 kg).  Last encounter: 12/15/2023. Last procedure: 01/11/2024.  Reason for encounter:  Post-Procedure Evaluation   Type: Lumbar epidural steroid injection (LESI) (interlaminar) #1  Laterality: Right   Level:  L4-5 Level.  Imaging: Fluoroscopic guidance         Anesthesia: Local anesthesia (1-2% Lidocaine ) Sedation:  Minimal Sedation                       DOS: 01/11/2024  Performed by: Wallie Sherry, MD  Purpose: Diagnostic/Therapeutic Indications: Lumbar radicular pain of intraspinal etiology of more than 4 weeks that has failed to respond to conservative therapy and is severe enough to impact quality of life or function. 1. Spinal stenosis, lumbar region, with neurogenic claudication   2. Chronic radicular lumbar pain   3. Lumbar radiculopathy    NAS-11 Pain score:   Pre-procedure: 5 /10   Post-procedure: 7 /10     Effectiveness:  Initial hour after procedure: 60 %  Subsequent 4-6 hours post-procedure: 100 %  Analgesia past initial 6 hours: 90 %  Ongoing improvement:  Analgesic:  90% Function: Somewhat improved ROM: [name] reports improvement in ROM    ROS  Constitutional: Denies any fever or chills Gastrointestinal: No reported hemesis, hematochezia, vomiting, or acute GI distress Musculoskeletal: Denies any acute onset joint swelling, redness, loss of ROM, or weakness Neurological: No reported episodes of acute onset apraxia, aphasia, dysarthria, agnosia, amnesia, paralysis, loss of coordination, or loss of consciousness  Medication Review  FreeStyle Libre 3 Plus Sensor, HYDROcodone -acetaminophen , gabapentin , losartan , metFORMIN , ondansetron , and tiZANidine   History Review  Allergy: [name] is allergic to other, ozempic  (0.25 or 0.5 mg-dose) [semaglutide (0.25 or 0.5mg -dos)], and trulicity  [dulaglutide ]. Drug: [name]  reports current drug use. Drug: Marijuana. Alcohol:  reports current alcohol use. Tobacco:  reports that he quit smoking about 28 years ago. His smoking use included cigarettes. He has never been exposed to tobacco smoke. His smokeless tobacco use includes  chew. Social: [name]  reports that he quit smoking about 28 years ago. His smoking use included cigarettes. He has never been exposed to tobacco smoke. His smokeless tobacco use includes chew. He reports  current alcohol use. He reports current drug use. Drug: Marijuana. Medical:  has a past medical history of Cirrhosis ([name]), Colon polyps, Complication of anesthesia, Diabetes mellitus without complication ([name]), Esophageal candidiasis ([name]), GERD (gastroesophageal reflux disease), Hypertension, Sleep apnea, and Thrombocytopenia. Surgical: [name]  has a past surgical history that includes Appendectomy; Back surgery; Colonoscopy; Colonoscopy with propofol  (N/A, 07/02/2019); Colonoscopy with propofol  (N/A, 09/17/2019); Endoscopic mucosal resection (N/A, 09/17/2019); Submucosal lifting injection (09/17/2019); Hemostasis clip placement (09/17/2019); IR Radiologist Eval & Mgmt (07/17/2020); IR US  Guide Vasc Access Left (07/24/2020); IR TIB-PERO ART ATHEREC INC PTA MOD SED (07/24/2020); and IR Angiogram Extremity Left (07/24/2020). Family: family history includes Brain cancer in his mother; Breast cancer in his sister; Diabetes in his father.  Laboratory Chemistry Profile   Renal Lab Results  Component Value Date   BUN 4 (L) 07/01/2023   CREATININE 1.06 07/01/2023   BCR 5 (L) 10/26/2022   GFR 79.16 07/01/2023   GFRAA 87 01/24/2020   GFRNONAA >60 07/24/2020    Hepatic Lab Results  Component Value Date   AST 26 07/01/2023   ALT 21 07/01/2023   ALBUMIN 3.1 (L) 07/01/2023   ALKPHOS 137 (H) 07/01/2023   LIPASE 93.0 (H) 07/15/2023    Electrolytes Lab Results  Component Value Date   NA 137 07/01/2023   K 3.8 07/01/2023   CL 100 07/01/2023   CALCIUM  8.9 07/01/2023   MG 1.8 11/30/2018    Bone Lab Results  Component Value Date   VD25OH 33.2 09/16/2021   TESTOSTERONE  477.03 07/25/2019    Inflammation (CRP: Acute Phase) (ESR: Chronic Phase) No results found for: CRP, ESRSEDRATE, LATICACIDVEN       Note: Above Lab results reviewed.  Recent Imaging Review  DG PAIN CLINIC C-ARM 1-60 MIN NO REPORT Fluoro was used, but no Radiologist interpretation will be provided.  Please refer to  NOTES tab for provider progress note. Note: Reviewed        Physical Exam  Vitals: BP (!) 173/83 (Cuff Size: Normal)   Pulse 87   Temp (!) 97.5 F (36.4 C) (Temporal)   Resp 14   Ht 5' 11 (1.803 m)   Wt 210 lb (95.3 kg)   SpO2 100%   BMI 29.29 kg/m  BMI: Estimated body mass index is 29.29 kg/m as calculated from the following:   Height as of this encounter: 5' 11 (1.803 m).   Weight as of this encounter: 210 lb (95.3 kg). Ideal: Ideal body weight: 75.3 kg (166 lb 0.1 oz) Adjusted ideal body weight: 83.3 kg (183 lb 9.7 oz) General appearance: Well nourished, well developed, and well hydrated. In no apparent acute distress Mental status: Alert, oriented x 3 (person, place, & time)       Respiratory: No evidence of acute respiratory distress Eyes: PERLA   Assessment   Diagnosis Status  1. Spinal stenosis, lumbar region, with neurogenic claudication   2. Chronic radicular lumbar pain   3. Lumbar radiculopathy    Controlled Controlled Controlled   Updated Problems: No problems updated.  Plan of Care  Excellent response to lumbar epidural steroid injection.  Patient notes approximately 90% improvement in his pain symptoms and improvement in his ability to perform ADLs.  Continue to monitor symptoms, repeat as needed. Orders:  Orders Placed This  Encounter  Procedures   Lumbar Epidural Injection    Standing Status:   Standing    Number of Occurrences:   3    Next Expected Occurrence:   04/20/2024    Expiration Date:   01/31/2025    Scheduling Instructions:     Procedure: Interlaminar Lumbar Epidural Steroid injection (LESI)            Laterality: Midline L4/5     Sedation: Patient's choice.     Timeframe: PRN    Where will this procedure be performed?:   [facility]     Right L4-L5 ESI 01/11/2024    Return for PRN L-ESI.    Recent Visits Date Type Provider Dept  01/11/24 Procedure visit Marcelino Nurse, MD [facility]  12/15/23 Office Visit  Marcelino Nurse, MD [facility]  Showing recent visits within past 90 days and meeting all other requirements Today's Visits Date Type Provider Dept  02/01/24 Office Visit Marcelino Nurse, MD [facility]  Showing today's visits and meeting all other requirements Future Appointments No visits were found meeting these conditions. Showing future appointments within next 90 days and meeting all other requirements  I discussed the assessment and treatment plan with the patient. The patient was provided an opportunity to ask questions and all were answered. The patient agreed with the plan and demonstrated an understanding of the instructions.  Patient advised to call back or seek an in-person evaluation if the symptoms or condition worsens. I personally spent a total of in the care of the patient today including preparing to see the patient, performing a medically appropriate exam/evaluation, counseling and educating, placing orders, and documenting clinical information in the EHR.   Note by: Nurse Marcelino, MD (TTS and AI technology used. I apologize for any typographical errors that were not detected and corrected.) Date: 02/01/2024; Time: 3:23 PM

## 2024-02-09 ENCOUNTER — Encounter: Payer: Self-pay | Admitting: Neurosurgery

## 2024-03-13 ENCOUNTER — Other Ambulatory Visit: Payer: Self-pay | Admitting: Family Medicine

## 2024-03-13 DIAGNOSIS — E1142 Type 2 diabetes mellitus with diabetic polyneuropathy: Secondary | ICD-10-CM

## 2024-03-13 MED ORDER — GABAPENTIN 600 MG PO TABS: ORAL_TABLET | ORAL | 1 refills | Status: AC

## 2024-03-13 NOTE — Telephone Encounter (Signed)
 Copied from CRM [id number]. Topic: Clinical - Medication Refill >> Mar 13, 2024  1:46 PM Donna E wrote: Patient states the pharmacy did not give him the second bottle of medication his medication bottle has 1/2 written on it   Medication: gabapentin  (NEURONTIN ) 600 MG tablet  Has the patient contacted their pharmacy? Yes Pharmacy stated it would be 2 to 3 days before getting refilled.   This is the patient's preferred pharmacy:   [facility] [address], [place] - [address] [place] [address] Phone: [phone] Fax: [phone]  Is this the correct pharmacy for this prescription? Yes If no, delete pharmacy and type the correct one.   Has the prescription been filled recently? Yes  Is the patient out of the medication? Yes  Has the patient been seen for an appointment in the last year OR does the patient have an upcoming appointment? Yes  Can we respond through MyChart? No  Agent: Please be advised that Rx refills may take up to 3 business days. We ask that you follow-up with your pharmacy.

## 2024-05-05 ENCOUNTER — Other Ambulatory Visit: Payer: Self-pay | Admitting: Family Medicine

## 2024-05-07 NOTE — Telephone Encounter (Signed)
Sent with note to schedule OV.

## 2024-05-07 NOTE — Telephone Encounter (Signed)
 Please advise if ok to refill patient has not been seen in office or had A1c recently  Lab Results  Component Value Date   HGBA1C 6.4 (A) 07/01/2023

## 2024-05-24 ENCOUNTER — Ambulatory Visit
Attending: Student in an Organized Health Care Education/Training Program | Admitting: Student in an Organized Health Care Education/Training Program

## 2024-05-24 ENCOUNTER — Encounter: Payer: Self-pay | Admitting: Student in an Organized Health Care Education/Training Program

## 2024-05-24 VITALS — BP 139/71 | HR 70 | Temp 97.5°F | Resp 16 | Ht 71.0 in | Wt 205.0 lb

## 2024-05-24 DIAGNOSIS — M5416 Radiculopathy, lumbar region: Secondary | ICD-10-CM | POA: Insufficient documentation

## 2024-05-24 DIAGNOSIS — G8929 Other chronic pain: Secondary | ICD-10-CM | POA: Diagnosis not present

## 2024-05-24 DIAGNOSIS — M48062 Spinal stenosis, lumbar region with neurogenic claudication: Secondary | ICD-10-CM | POA: Insufficient documentation

## 2024-05-24 DIAGNOSIS — M25561 Pain in right knee: Secondary | ICD-10-CM | POA: Diagnosis not present

## 2024-05-24 DIAGNOSIS — M1711 Unilateral primary osteoarthritis, right knee: Secondary | ICD-10-CM | POA: Diagnosis not present

## 2024-05-24 MED ORDER — PREDNISONE 20 MG PO TABS: ORAL_TABLET | ORAL | 0 refills | Status: AC

## 2024-05-24 NOTE — Patient Instructions (Signed)
GENERAL RISKS AND COMPLICATIONS ° °What are the risk, side effects and possible complications? °Generally speaking, most procedures are safe.  However, with any procedure there are risks, side effects, and the possibility of complications.  The risks and complications are dependent upon the sites that are lesioned, or the type of nerve block to be performed.  The closer the procedure is to the spine, the more serious the risks are.  Great care is taken when placing the radio frequency needles, block needles or lesioning probes, but sometimes complications can occur. °Infection: Any time there is an injection through the skin, there is a risk of infection.  This is why sterile conditions are used for these blocks.  There are four possible types of infection. °Localized skin infection. °Central Nervous System Infection-This can be in the form of Meningitis, which can be deadly. °Epidural Infections-This can be in the form of an epidural abscess, which can cause pressure inside of the spine, causing compression of the spinal cord with subsequent paralysis. This would require an emergency surgery to decompress, and there are no guarantees that the patient would recover from the paralysis. °Discitis-This is an infection of the intervertebral discs.  It occurs in about 1% of discography procedures.  It is difficult to treat and it may lead to surgery. ° °      2. Pain: the needles have to go through skin and soft tissues, will cause soreness. °      3. Damage to internal structures:  The nerves to be lesioned may be near blood vessels or   ° other nerves which can be potentially damaged. °      4. Bleeding: Bleeding is more common if the patient is taking blood thinners such as  aspirin, Coumadin, Ticiid, Plavix, etc., or if he/she have some genetic predisposition  such as hemophilia. Bleeding into the spinal canal can cause compression of the spinal  cord with subsequent paralysis.  This would require an emergency  surgery to  decompress and there are no guarantees that the patient would recover from the  paralysis. °      5. Pneumothorax:  Puncturing of a lung is a possibility, every time a needle is introduced in  the area of the chest or upper back.  Pneumothorax refers to free air around the  collapsed lung(s), inside of the thoracic cavity (chest cavity).  Another two possible  complications related to a similar event would include: Hemothorax and Chylothorax.   These are variations of the Pneumothorax, where instead of air around the collapsed  lung(s), you may have blood or chyle, respectively. °      6. Spinal headaches: They may occur with any procedures in the area of the spine. °      7. Persistent CSF (Cerebro-Spinal Fluid) leakage: This is a rare problem, but may occur  with prolonged intrathecal or epidural catheters either due to the formation of a fistulous  track or a dural tear. °      8. Nerve damage: By working so close to the spinal cord, there is always a possibility of  nerve damage, which could be as serious as a permanent spinal cord injury with  paralysis. °      9. Death:  Although rare, severe deadly allergic reactions known as "Anaphylactic  reaction" can occur to any of the medications used. °     10. Worsening of the symptoms:  We can always make thing worse. ° °What are the chances   of something like this happening? Chances of any of this occuring are extremely low.  By statistics, you have more of a chance of getting killed in a motor vehicle accident: while driving to the hospital than any of the above occurring .  Nevertheless, you should be aware that they are possibilities.  In general, it is similar to taking a shower.  Everybody knows that you can slip, hit your head and get killed.  Does that mean that you should not shower again?  Nevertheless always keep in mind that statistics do not mean anything if you happen to be on the wrong side of them.  Even if a procedure has a 1 (one) in a  1,000,000 (million) chance of going wrong, it you happen to be that one..Also, keep in mind that by statistics, you have more of a chance of having something go wrong when taking medications.  Who should not have this procedure? If you are on a blood thinning medication (e.g. Coumadin, Plavix, see list of "Blood Thinners"), or if you have an active infection going on, you should not have the procedure.  If you are taking any blood thinners, please inform your physician.  How should I prepare for this procedure? Do not eat or drink anything at least six hours prior to the procedure. Bring a driver with you .  It cannot be a taxi. Come accompanied by an adult that can drive you back, and that is strong enough to help you if your legs get weak or numb from the local anesthetic. Take all of your medicines the morning of the procedure with just enough water to swallow them. If you have diabetes, make sure that you are scheduled to have your procedure done first thing in the morning, whenever possible. If you have diabetes, take only half of your insulin dose and notify our nurse that you have done so as soon as you arrive at the clinic. If you are diabetic, but only take blood sugar pills (oral hypoglycemic), then do not take them on the morning of your procedure.  You may take them after you have had the procedure. Do not take aspirin or any aspirin-containing medications, at least eleven (11) days prior to the procedure.  They may prolong bleeding. Wear loose fitting clothing that may be easy to take off and that you would not mind if it got stained with Betadine or blood. Do not wear any jewelry or perfume Remove any nail coloring.  It will interfere with some of our monitoring equipment.  NOTE: Remember that this is not meant to be interpreted as a complete list of all possible complications.  Unforeseen problems may occur.  BLOOD THINNERS The following drugs contain aspirin or other products,  which can cause increased bleeding during surgery and should not be taken for 2 weeks prior to and 1 week after surgery.  If you should need take something for relief of minor pain, you may take acetaminophen which is found in Tylenol,m Datril, Anacin-3 and Panadol. It is not blood thinner. The products listed below are.  Do not take any of the products listed below in addition to any listed on your instruction sheet.  A.P.C or A.P.C with Codeine Codeine Phosphate Capsules #3 Ibuprofen Ridaura  ABC compound Congesprin Imuran rimadil  Advil Cope Indocin Robaxisal  Alka-Seltzer Effervescent Pain Reliever and Antacid Coricidin or Coricidin-D  Indomethacin Rufen  Alka-Seltzer plus Cold Medicine Cosprin Ketoprofen S-A-C Tablets  Anacin Analgesic Tablets or Capsules Coumadin   Korlgesic Salflex  Anacin Extra Strength Analgesic tablets or capsules CP-2 Tablets Lanoril Salicylate  Anaprox Cuprimine Capsules Levenox Salocol  Anexsia-D Dalteparin Magan Salsalate  Anodynos Darvon compound Magnesium Salicylate Sine-off  Ansaid Dasin Capsules Magsal Sodium Salicylate  Anturane Depen Capsules Marnal Soma  APF Arthritis pain formula Dewitt's Pills Measurin Stanback  Argesic Dia-Gesic Meclofenamic Sulfinpyrazone  Arthritis Bayer Timed Release Aspirin Diclofenac Meclomen Sulindac  Arthritis pain formula Anacin Dicumarol Medipren Supac  Analgesic (Safety coated) Arthralgen Diffunasal Mefanamic Suprofen  Arthritis Strength Bufferin Dihydrocodeine Mepro Compound Suprol  Arthropan liquid Dopirydamole Methcarbomol with Aspirin Synalgos  ASA tablets/Enseals Disalcid Micrainin Tagament  Ascriptin Doan's Midol Talwin  Ascriptin A/D Dolene Mobidin Tanderil  Ascriptin Extra Strength Dolobid Moblgesic Ticlid  Ascriptin with Codeine Doloprin or Doloprin with Codeine Momentum Tolectin  Asperbuf Duoprin Mono-gesic Trendar  Aspergum Duradyne Motrin or Motrin IB Triminicin  Aspirin plain, buffered or enteric coated  Durasal Myochrisine Trigesic  Aspirin Suppositories Easprin Nalfon Trillsate  Aspirin with Codeine Ecotrin Regular or Extra Strength Naprosyn Uracel  Atromid-S Efficin Naproxen Ursinus  Auranofin Capsules Elmiron Neocylate Vanquish  Axotal Emagrin Norgesic Verin  Azathioprine Empirin or Empirin with Codeine Normiflo Vitamin E  Azolid Emprazil Nuprin Voltaren  Bayer Aspirin plain, buffered or children's or timed BC Tablets or powders Encaprin Orgaran Warfarin Sodium  Buff-a-Comp Enoxaparin Orudis Zorpin  Buff-a-Comp with Codeine Equegesic Os-Cal-Gesic   Buffaprin Excedrin plain, buffered or Extra Strength Oxalid   Bufferin Arthritis Strength Feldene Oxphenbutazone   Bufferin plain or Extra Strength Feldene Capsules Oxycodone with Aspirin   Bufferin with Codeine Fenoprofen Fenoprofen Pabalate or Pabalate-SF   Buffets II Flogesic Panagesic   Buffinol plain or Extra Strength Florinal or Florinal with Codeine Panwarfarin   Buf-Tabs Flurbiprofen Penicillamine   Butalbital Compound Four-way cold tablets Penicillin   Butazolidin Fragmin Pepto-Bismol   Carbenicillin Geminisyn Percodan   Carna Arthritis Reliever Geopen Persantine   Carprofen Gold's salt Persistin   Chloramphenicol Goody's Phenylbutazone   Chloromycetin Haltrain Piroxlcam   Clmetidine heparin Plaquenil   Cllnoril Hyco-pap Ponstel   Clofibrate Hydroxy chloroquine Propoxyphen         Before stopping any of these medications, be sure to consult the physician who ordered them.  Some, such as Coumadin (Warfarin) are ordered to prevent or treat serious conditions such as "deep thrombosis", "pumonary embolisms", and other heart problems.  The amount of time that you may need off of the medication may also vary with the medication and the reason for which you were taking it.  If you are taking any of these medications, please make sure you notify your pain physician before you undergo any procedures.         Moderate Conscious  Sedation, Adult Sedation is the use of medicines to help you relax and not feel pain. Moderate conscious sedation is a type of sedation that makes you less alert than normal. You are still able to respond to instructions, touch, or both. This type of sedation is used during short medical and dental procedures. It is milder than deep sedation, which is a type of sedation you cannot be easily woken up from. It is also milder than general anesthesia, which is the use of medicines to make you fall asleep. Moderate conscious sedation lets you return to your normal activities sooner. Tell a health care provider about: Any allergies you have. All medicines you are taking, including vitamins, herbs, steroids, eye drops, creams, and over-the-counter medicines. Any problems you or family members have had with anesthesia.  Any bleeding problems you have. Any surgeries you have had. Any medical conditions you have. Whether you are pregnant or may be pregnant. Any recent alcohol, tobacco, or drug use. What are the risks? Your health care provider will talk with you about risks. These may include: Oversedation. This is when you get too much medicine. Nausea or vomiting. Allergic reaction to medicines. Trouble breathing. If this happens, a breathing tube may be used. It will be removed when you can breathe better on your own. Heart trouble. Lung trouble. Emergence delirium. This is when you feel confused while the sedation wears off. This gets better with time. What happens before the procedure? When to stop eating and drinking Follow instructions from your health care provider about what you may eat and drink. These may include: 8 hours before your procedure Stop eating most foods. Do not eat meat, fried foods, or fatty foods. Eat only light foods, such as toast or crackers. All liquids are okay except energy drinks and alcohol. 6 hours before your procedure Stop eating. Drink only clear liquids, such  as water, clear fruit juice, black coffee, plain tea, and sports drinks. Do not drink energy drinks or alcohol. 2 hours before your procedure Stop drinking all liquids. You may be allowed to take medicines with small sips of water. If you do not follow your health care provider's instructions, your procedure may be delayed or canceled. Medicines Ask your health care provider about: Changing or stopping your regular medicines. These include any diabetes medicines or blood thinners you take. Taking medicines such as aspirin and ibuprofen. These medicines can thin your blood. Do not take them unless your health care provider tells you to. Taking over-the-counter medicines, vitamins, herbs, and supplements. Tests and exams You may have an exam or testing. You may have a blood or urine sample taken. General instructions Do not use any products that contain nicotine or tobacco for at least 4 weeks before the procedure. These products include cigarettes, chewing tobacco, and vaping devices, such as e-cigarettes. If you need help quitting, ask your health care provider. If you will be going home right after the procedure, plan to have a responsible adult: Take you home from the hospital or clinic. You will not be allowed to drive. Care for you for the time you are told. What happens during the procedure?  You will be given the sedative. It may be given: As a pill you can take by mouth. It can also be put into the rectum. As a spray through the nose. As an injection into muscle. As an injection into a vein through an IV. You may be given oxygen as needed. Your blood pressure, heart rate, breathing rate, and blood oxygen level will be monitored during the procedure. The medical or dental procedure will be done. The procedure may vary among health care providers and hospitals. What happens after the procedure? Your blood pressure, heart rate, breathing rate, and blood oxygen level will be  monitored until you leave the hospital or clinic. You will get fluids through an IV as needed. Do not drive or operate machinery until your health care provider says that it is safe. This information is not intended to replace advice given to you by your health care provider. Make sure you discuss any questions you have with your health care provider. Document Revised: 11/02/2021 Document Reviewed: 11/02/2021 [employer] Patient Education  2024 [employer] Epidural Steroid Injection Patient Information  Description: The epidural space surrounds the nerves as  they exit the spinal cord.  In some patients, the nerves can be compressed and inflamed by a bulging disc or a tight spinal canal (spinal stenosis).  By injecting steroids into the epidural space, we can bring irritated nerves into direct contact with a potentially helpful medication.  These steroids act directly on the irritated nerves and can reduce swelling and inflammation which often leads to decreased pain.  Epidural steroids may be injected anywhere along the spine and from the neck to the low back depending upon the location of your pain.   After numbing the skin with local anesthetic (like Novocaine), a small needle is passed into the epidural space slowly.  You may experience a sensation of pressure while this is being done.  The entire block usually last less than 10 minutes.  Conditions which may be treated by epidural steroids:  Low back and leg pain Neck and arm pain Spinal stenosis Post-laminectomy syndrome Herpes zoster (shingles) pain Pain from compression fractures  Preparation for the injection:  Do not eat any solid food or dairy products within 8 hours of your appointment.  You may drink clear liquids up to 3 hours before appointment.  Clear liquids include water, black coffee, juice or soda.  No milk or cream please. You may take your regular medication, including pain medications, with a sip of water before your  appointment  Diabetics should hold regular insulin (if taken separately) and take 1/2 normal NPH dos the morning of the procedure.  Carry some sugar containing items with you to your appointment. A driver must accompany you and be prepared to drive you home after your procedure.  Bring all your current medications with your. An IV may be inserted and sedation may be given at the discretion of the physician.   A blood pressure cuff, EKG and other monitors will often be applied during the procedure.  Some patients may need to have extra oxygen administered for a short period. You will be asked to provide medical information, including your allergies, prior to the procedure.  We must know immediately if you are taking blood thinners (like Coumadin/Warfarin)  Or if you are allergic to IV iodine contrast (dye). We must know if you could possible be pregnant.  Possible side-effects: Bleeding from needle site Infection (rare, may require surgery) Nerve injury (rare) Numbness & tingling (temporary) Difficulty urinating (rare, temporary) Spinal headache ( a headache worse with upright posture) Light -headedness (temporary) Pain at injection site (several days) Decreased blood pressure (temporary) Weakness in arm/leg (temporary) Pressure sensation in back/neck (temporary)  Call if you experience: Fever/chills associated with headache or increased back/neck pain. Headache worsened by an upright position. New onset weakness or numbness of an extremity below the injection site Hives or difficulty breathing (go to the emergency room) Inflammation or drainage at the infection site Severe back/neck pain Any new symptoms which are concerning to you  Please note:  Although the local anesthetic injected can often make your back or neck feel good for several hours after the injection, the pain will likely return.  It takes 3-7 days for steroids to work in the epidural space.  You may not notice any pain  relief for at least that one week.  If effective, we will often do a series of three injections spaced 3-6 weeks apart to maximally decrease your pain.  After the initial series, we generally will wait several months before considering a repeat injection of the same type.  If you have any  questions, please call [phone] [facility]

## 2024-05-24 NOTE — Progress Notes (Signed)
 PROVIDER NOTE: Interpretation of information contained herein should be left to medically-trained personnel. Specific patient instructions are provided elsewhere under Patient Instructions section of medical record. This document was created in part using AI and STT-dictation technology, any transcriptional errors that may result from this process are unintentional.  Patient: [name]  Service: E/M   PCP: Cleatus Arlyss RAMAN, MD  DOB: May 02, 1966  DOS: 05/24/2024  Provider: Wallie Sherry, MD  MRN: [id number]  Delivery: Face-to-face  Specialty: Interventional Pain Management  Type: Established Patient  Setting: Ambulatory outpatient facility  Specialty designation: 09  Referring Prov.: Cleatus Arlyss RAMAN, MD  Location: Outpatient office facility       History of present illness (HPI) [name], a 59 y.o. 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 [name] primary complain today is Back Pain   Pain Assessment: Severity of Chronic pain is reported as a 10-Worst pain ever/10. Location: Back  /to right buttock. Onset: More than a month ago. Quality: Sharp, Burning, Other (Comment) (stinging pain when I move). Timing: Constant. Modifying factor(s): sitting. Vitals:  height is 5' 11 (1.803 m) and weight is 205 lb (93 kg). His temperature is 97.5 F (36.4 C) (abnormal). His blood pressure is 139/71 and his pulse is 70. His respiration is 16 and oxygen saturation is 100%.  BMI: Estimated body mass index is 28.59 kg/m as calculated from the following:   Height as of this encounter: 5' 11 (1.803 m).   Weight as of this encounter: 205 lb (93 kg).  Last encounter: 02/01/2024. Last procedure: 01/11/2024.  Reason for encounter:   Discussed the use of AI scribe software for clinical note transcription with the patient, who gave verbal consent to proceed.  History of Present Illness   [name] is a 59 year old male  with lumbar degenerative disc disease and lumbar radicular pain status post lumbar ESI at L4-L5 on 01/11/2024 that provided him with 75% pain relief for over 4 months.  He experiences recurring significant lower back pain. His insurance requires a full evaluation before receiving the injection. He describes the previous injection as 'extremely painful' and is inquiring about alternative methods of administration.  He requests a refill of tizanidine . The pain no longer radiates down his right leg, but if it does, it starts in the hamstring and is primarily located in the buttock, described as feeling like 'somebody's ripping it apart.' The symptoms improved significantly with the first epidural injection, providing relief for a considerable duration (75% pain relief for 4 months).  No current pain radiating down his right leg. He denies being on any blood thinners. He describes himself as 'extremely sedentary' and expresses that if he were not in pain, he could accomplish more. He is open to the possibility of oral steroids despite potential side effects like increased energy and insomnia, which he does not mind due to his sedentary lifestyle.        ROS  Constitutional: Denies any fever or chills Gastrointestinal: No reported hemesis, hematochezia, vomiting, or acute GI distress Musculoskeletal: Denies any acute onset joint swelling, redness, loss of ROM, or weakness Neurological: Low back and radiating right leg pain  Medication Review  FreeStyle Libre 3 Plus Sensor, HYDROcodone -acetaminophen , gabapentin , losartan , metFORMIN , ondansetron , predniSONE , and tiZANidine   History Review  Allergy: [name] is allergic to other, ozempic  (0.25 or 0.5 mg-dose) [semaglutide (0.25 or 0.5mg -dos)], and trulicity  [dulaglutide ]. Drug: [name]  reports current  drug use. Drug: Marijuana. Alcohol:  reports current alcohol use. Tobacco:  reports that he quit smoking about 28 years ago. His smoking use included  cigarettes. He has never been exposed to tobacco smoke. His smokeless tobacco use includes chew. Social: [name]  reports that he quit smoking about 28 years ago. His smoking use included cigarettes. He has never been exposed to tobacco smoke. His smokeless tobacco use includes chew. He reports current alcohol use. He reports current drug use. Drug: Marijuana. Medical:  has a past medical history of Cirrhosis ([name]), Colon polyps, Complication of anesthesia, Diabetes mellitus without complication ([name]), Esophageal candidiasis ([name]), GERD (gastroesophageal reflux disease), Hypertension, Sleep apnea, and Thrombocytopenia. Surgical: [name]  has a past surgical history that includes Appendectomy; Back surgery; Colonoscopy; Colonoscopy with propofol  (N/A, 07/02/2019); Colonoscopy with propofol  (N/A, 09/17/2019); Endoscopic mucosal resection (N/A, 09/17/2019); Submucosal lifting injection (09/17/2019); Hemostasis clip placement (09/17/2019); IR Radiologist Eval & Mgmt (07/17/2020); IR US  Guide Vasc Access Left (07/24/2020); IR TIB-PERO ART ATHEREC INC PTA MOD SED (07/24/2020); and IR Angiogram Extremity Left (07/24/2020). Family: family history includes Brain cancer in his mother; Breast cancer in his sister; Diabetes in his father.  Laboratory Chemistry Profile   Renal Lab Results  Component Value Date   BUN 4 (L) 07/01/2023   CREATININE 1.06 07/01/2023   BCR 5 (L) 10/26/2022   GFR 79.16 07/01/2023   GFRAA 87 01/24/2020   GFRNONAA >60 07/24/2020    Hepatic Lab Results  Component Value Date   AST 26 07/01/2023   ALT 21 07/01/2023   ALBUMIN 3.1 (L) 07/01/2023   ALKPHOS 137 (H) 07/01/2023   LIPASE 93.0 (H) 07/15/2023    Electrolytes Lab Results  Component Value Date   NA 137 07/01/2023   K 3.8 07/01/2023   CL 100 07/01/2023   CALCIUM  8.9 07/01/2023   MG 1.8 11/30/2018    Bone Lab Results  Component Value Date   VD25OH 33.2 09/16/2021   TESTOSTERONE  477.03 07/25/2019     Inflammation (CRP: Acute Phase) (ESR: Chronic Phase) No results found for: CRP, ESRSEDRATE, LATICACIDVEN       Note: Above Lab results reviewed.  Recent Imaging Review  MR LUMBAR SPINE WO CONTRAST   Narrative CLINICAL DATA:  Low back pain, prior surgery, new symptoms.   EXAM: MRI LUMBAR SPINE WITHOUT CONTRAST   TECHNIQUE: Multiplanar, multisequence MR imaging of the lumbar spine was performed. No intravenous contrast was administered.   COMPARISON:  MRI lumbar spine February 18, 2020. Lumbar radiographs October 28, 2023.   FINDINGS: Segmentation:  Standard.   Alignment:  Similar trace retrolisthesis of L2 on L3.   Vertebrae:  No fracture, evidence of discitis, or bone lesion.   Conus medullaris and cauda equina: Conus extends to the L1 level. Conus and cauda equina appear normal.   Paraspinal and other soft tissues: Negative.   Disc levels:   T12-L1: No significant disc protrusion, foraminal stenosis, or canal stenosis.   L1-L2: No significant disc protrusion, foraminal stenosis, or canal stenosis.   L2-L3: Disc bulging, ligamentum flavum thickening and facet arthropathy. Prominent dorsal epidural fat. Progressive moderate left and mild right foraminal stenosis. Progressive moderate canal and left greater than right subarticular recess stenosis.   L3-L4: Disc bulging with mild to moderate bilateral foraminal stenosis. Patent canal.   L4-L5: Broad disc bulge with superimposed central disc protrusion. Ligamentum flavum thickening and facet arthropathy. Progressive severe canal stenosis. Similar moderate bilateral foraminal stenosis, left greater than right.   L5-S1: Ankylosis across the  disc space. Prior left hemilaminectomy and discectomy. Bony spurring with similar moderate to severe bilateral foraminal stenosis.   IMPRESSION: 1. At L4-L5, progressive severe canal stenosis. Similar left greater than right moderate foraminal stenosis. 2. At L2-L3,  progressive moderate canal stenosis with moderate left and mild right foraminal stenosis. 3. At L5-S1, similar bony ankylosis with moderate to severe bilateral foraminal stenosis.     Electronically Signed By: Gilmore GORMAN Molt M.D. On: 11/07/2023 23:32     Note: Reviewed        Physical Exam  Vitals: BP 139/71   Pulse 70   Temp (!) 97.5 F (36.4 C)   Resp 16   Ht 5' 11 (1.803 m)   Wt 205 lb (93 kg)   SpO2 100%   BMI 28.59 kg/m  BMI: Estimated body mass index is 28.59 kg/m as calculated from the following:   Height as of this encounter: 5' 11 (1.803 m).   Weight as of this encounter: 205 lb (93 kg). Ideal: Ideal body weight: 75.3 kg (166 lb 0.1 oz) Adjusted ideal body weight: 82.4 kg (181 lb 9.7 oz) General appearance: Well nourished, well developed, and well hydrated. In no apparent acute distress Mental status: Alert, oriented x 3 (person, place, & time)       Respiratory: No evidence of acute respiratory distress Eyes: PERLA  Lumbar Spine Area Exam  Skin & Axial Inspection: No masses, redness, or swelling Alignment: Symmetrical Functional ROM: Unrestricted ROM       Stability: No instability detected Muscle Tone/Strength: Functionally intact. No obvious neuro-muscular anomalies detected. Sensory (Neurological): Dermatomal pain pattern on the right Palpation: No palpable anomalies       Provocative Tests: Hyperextension/rotation test: Positive for right foraminal stenosis Gait & Posture Assessment  Ambulation: Unassisted Gait: Relatively normal for age and body habitus Posture: WNL  Lower Extremity Exam      Side: Right lower extremity   Side: Left lower extremity  Stability: No instability observed           Stability: No instability observed          Skin & Extremity Inspection: Skin color, temperature, and hair growth are WNL. No peripheral edema or cyanosis. No masses, redness, swelling, asymmetry, or associated skin lesions. No contractures.   Skin &  Extremity Inspection: Skin color, temperature, and hair growth are WNL. No peripheral edema or cyanosis. No masses, redness, swelling, asymmetry, or associated skin lesions. No contractures.  Functional ROM: Unrestricted ROM                   Functional ROM: Unrestricted ROM                  Muscle Tone/Strength: Functionally intact. No obvious neuro-muscular anomalies detected.   Muscle Tone/Strength: Functionally intact. No obvious neuro-muscular anomalies detected.  Sensory (Neurological): Unimpaired         Sensory (Neurological): Unimpaired        DTR: Patellar: deferred today Achilles: deferred today Plantar: deferred today   DTR: Patellar: deferred today Achilles: deferred today Plantar: deferred today  Palpation: No palpable anomalies   Palpation: No palpable anomalies    Assessment   Diagnosis  1. Spinal stenosis, lumbar region, with neurogenic claudication   2. Chronic radicular lumbar pain   3. Lumbar radiculopathy   4. Chronic pain of right knee   5. Primary osteoarthritis of right knee      Updated Problems: No problems updated.  Plan of Care  Assessment  and Plan    Lumbar spinal stenosis with neurogenic claudication and radiculopathy   Chronic lumbar spinal stenosis affects his lower back and right buttock, causing pain, heaviness, and a tearing sensation. Previous epidural steroid injections provided significant relief, lasting longer than expected despite extensive degeneration and arthritis. Prescribe a 9-day course of oral steroids to manage symptoms, noting potential side effects like increased blood sugar, blood pressure, and insomnia. An epidural steroid injection is scheduled for February 4th, 2026, with IV sedation if necessary. Cancel the injection if oral steroids offer sufficient relief. Continue tizanidine  as prescribed.  Refilled as below       [name] has a current medication list which includes the following long-term medication(s):  gabapentin , losartan , and metformin .  Pharmacotherapy (Medications Ordered): Meds ordered this encounter  Medications   predniSONE  (DELTASONE ) 20 MG tablet    Sig: Take 3 tablets (60 mg total) by mouth daily with breakfast for 3 days, THEN 2 tablets (40 mg total) daily with breakfast for 3 days, THEN 1 tablet (20 mg total) daily with breakfast for 3 days.    Dispense:  18 tablet    Refill:  0   Orders:  Orders Placed This Encounter  Procedures   Lumbar Epidural Injection    Standing Status:   Future    Expected Date:   06/06/2024    Expiration Date:   05/24/2025    Scheduling Instructions:     Procedure: Interlaminar Lumbar Epidural Steroid injection (LESI)            Laterality: Right L4-5     Sedation: IV Versed      Timeframe: As soon as schedule allows.    Where will this procedure be performed?:   [facility]     Right L4-L5 ESI 01/11/2024    Return in about 13 days (around 06/06/2024) for Right L4-5 ESI, in clinic IV Versed  .    Recent Visits No visits were found meeting these conditions. Showing recent visits within past 90 days and meeting all other requirements Today's Visits Date Type Provider Dept  05/24/24 Office Visit Marcelino Nurse, MD [facility]  Showing today's visits and meeting all other requirements Future Appointments No visits were found meeting these conditions. Showing future appointments within next 90 days and meeting all other requirements  I discussed the assessment and treatment plan with the patient. The patient was provided an opportunity to ask questions and all were answered. The patient agreed with the plan and demonstrated an understanding of the instructions.  Patient advised to call back or seek an in-person evaluation if the symptoms or condition worsens.  I personally spent a total of 30 minutes in the care of the patient today including preparing to see the patient, getting/reviewing separately obtained history,  performing a medically appropriate exam/evaluation, counseling and educating, placing orders, and documenting clinical information in the EHR.   Note by: Nurse Marcelino, MD (TTS and AI technology used. I apologize for any typographical errors that were not detected and corrected.) Date: 05/24/2024; Time: 11:34 AM

## 2024-05-24 NOTE — Progress Notes (Signed)
 Safety precautions to be maintained throughout the outpatient stay will include: orient to surroundings, keep bed in low position, maintain call bell within reach at all times, provide assistance with transfer out of bed and ambulation.

## 2024-05-30 ENCOUNTER — Other Ambulatory Visit: Payer: Self-pay | Admitting: *Deleted

## 2024-05-30 ENCOUNTER — Telehealth: Payer: Self-pay | Admitting: Student in an Organized Health Care Education/Training Program

## 2024-05-30 MED ORDER — TIZANIDINE HCL 2 MG PO TABS: 2.0000 mg | ORAL_TABLET | Freq: Three times a day (TID) | ORAL | 2 refills | Status: AC | PRN

## 2024-05-30 NOTE — Telephone Encounter (Signed)
 Rx request sent to [facility].

## 2024-05-30 NOTE — Telephone Encounter (Signed)
 Patient states there was supposed to be another medication (Tizanidine ) sent to his pharmacy when he was here 05-24-24. Please advise patient.

## 2024-05-30 NOTE — Progress Notes (Unsigned)
 Safety precautions to be maintained throughout the outpatient stay will include: orient to surroundings, keep bed in low position, maintain call bell within reach at all times, provide assistance with transfer out of bed and ambulation.

## 2024-06-06 ENCOUNTER — Ambulatory Visit (HOSPITAL_BASED_OUTPATIENT_CLINIC_OR_DEPARTMENT_OTHER): Admitting: Student in an Organized Health Care Education/Training Program

## 2024-06-06 ENCOUNTER — Ambulatory Visit
Admission: RE | Admit: 2024-06-06 | Discharge: 2024-06-06 | Disposition: A | Source: Ambulatory Visit | Attending: Student in an Organized Health Care Education/Training Program | Admitting: Student in an Organized Health Care Education/Training Program

## 2024-06-06 ENCOUNTER — Encounter: Payer: Self-pay | Admitting: Student in an Organized Health Care Education/Training Program

## 2024-06-06 ENCOUNTER — Other Ambulatory Visit: Payer: Self-pay | Admitting: Student in an Organized Health Care Education/Training Program

## 2024-06-06 VITALS — BP 146/68 | HR 82 | Temp 97.2°F | Resp 18 | Ht 71.0 in | Wt 205.0 lb

## 2024-06-06 DIAGNOSIS — G894 Chronic pain syndrome: Secondary | ICD-10-CM

## 2024-06-06 DIAGNOSIS — M48062 Spinal stenosis, lumbar region with neurogenic claudication: Secondary | ICD-10-CM | POA: Diagnosis not present

## 2024-06-06 DIAGNOSIS — M5416 Radiculopathy, lumbar region: Secondary | ICD-10-CM | POA: Diagnosis not present

## 2024-06-06 DIAGNOSIS — G8929 Other chronic pain: Secondary | ICD-10-CM | POA: Diagnosis not present

## 2024-06-06 DIAGNOSIS — E118 Type 2 diabetes mellitus with unspecified complications: Secondary | ICD-10-CM

## 2024-06-06 MED ORDER — ROPIVACAINE HCL 2 MG/ML IJ SOLN
INTRAMUSCULAR | Status: AC
  Filled 2024-06-06: qty 20

## 2024-06-06 MED ORDER — FENTANYL CITRATE (PF) 100 MCG/2ML IJ SOLN
25.0000 ug | INTRAMUSCULAR | Status: DC | PRN
  Administered 2024-06-06: 75 ug via INTRAVENOUS

## 2024-06-06 MED ORDER — MIDAZOLAM HCL (PF) 2 MG/2ML IJ SOLN
0.5000 mg | Freq: Once | INTRAMUSCULAR | Status: AC
  Administered 2024-06-06: 2 mg via INTRAVENOUS

## 2024-06-06 MED ORDER — SODIUM CHLORIDE (PF) 0.9 % IJ SOLN
INTRAMUSCULAR | Status: AC
  Filled 2024-06-06: qty 10

## 2024-06-06 MED ORDER — ROPIVACAINE HCL 2 MG/ML IJ SOLN
2.0000 mL | Freq: Once | INTRAMUSCULAR | Status: AC
  Administered 2024-06-06: 2 mL via EPIDURAL

## 2024-06-06 MED ORDER — IOHEXOL 180 MG/ML  SOLN
INTRAMUSCULAR | Status: AC
  Filled 2024-06-06: qty 20

## 2024-06-06 MED ORDER — IOHEXOL 180 MG/ML  SOLN
10.0000 mL | Freq: Once | INTRAMUSCULAR | Status: AC
  Administered 2024-06-06: 10 mL via EPIDURAL

## 2024-06-06 MED ORDER — LIDOCAINE HCL 2 % IJ SOLN
20.0000 mL | Freq: Once | INTRAMUSCULAR | Status: AC
  Administered 2024-06-06: 100 mg

## 2024-06-06 MED ORDER — DEXAMETHASONE SOD PHOSPHATE PF 10 MG/ML IJ SOLN
10.0000 mg | Freq: Once | INTRAMUSCULAR | Status: AC
  Administered 2024-06-06: 10 mg

## 2024-06-06 MED ORDER — LIDOCAINE HCL (PF) 2 % IJ SOLN
INTRAMUSCULAR | Status: AC
  Filled 2024-06-06: qty 5

## 2024-06-06 MED ORDER — SODIUM CHLORIDE 0.9% FLUSH
2.0000 mL | Freq: Once | INTRAVENOUS | Status: AC
  Administered 2024-06-06: 2 mL

## 2024-06-06 MED ORDER — MIDAZOLAM HCL 2 MG/2ML IJ SOLN
INTRAMUSCULAR | Status: AC
  Filled 2024-06-06: qty 2

## 2024-06-06 MED ORDER — DEXAMETHASONE SOD PHOSPHATE PF 10 MG/ML IJ SOLN
INTRAMUSCULAR | Status: AC
  Filled 2024-06-06: qty 1

## 2024-06-06 MED ORDER — FENTANYL CITRATE (PF) 100 MCG/2ML IJ SOLN
INTRAMUSCULAR | Status: AC
  Filled 2024-06-06: qty 2

## 2024-06-06 MED ORDER — LACTATED RINGERS IV SOLN: Freq: Once | INTRAVENOUS | Status: AC

## 2024-06-06 NOTE — Patient Instructions (Signed)

## 2024-06-06 NOTE — Progress Notes (Deleted)
 PROVIDER NOTE: Interpretation of information contained herein should be left to medically-trained personnel. Specific patient instructions are provided elsewhere under Patient Instructions section of medical record. This document was created in part using STT-dictation technology, any transcriptional errors that may result from this process are unintentional.  Patient: [name] Type: Established DOB: [birth year] MRN: [id number] PCP: Cleatus Arlyss RAMAN, MD  Service: Procedure DOS: 06/06/2024 Setting: Ambulatory Location: Ambulatory outpatient facility Delivery: Face-to-face Provider: Wallie Sherry, MD Specialty: Interventional Pain Management Specialty designation: 09 Location: Outpatient facility Ref. Prov.: Sherry Wallie, MD       Interventional Therapy   Type: Lumbar epidural steroid injection (LESI) (interlaminar) #2  Laterality: Right   Level:  L4-5 Level.  Imaging: Fluoroscopic guidance         Anesthesia: Local anesthesia (1-2% Lidocaine ) IV Anxiolysis DOS: 06/06/2024  Performed by: Wallie Sherry, MD  Purpose: Diagnostic/Therapeutic Indications: Lumbar radicular pain of intraspinal etiology of more than 4 weeks that has failed to respond to conservative therapy and is severe enough to impact quality of life or function. 1. Spinal stenosis, lumbar region, with neurogenic claudication   2. Chronic radicular lumbar pain   3. Lumbar radiculopathy    NAS-11 Pain score:   Pre-procedure: 9 /10   Post-procedure: 9 /10      Position / Prep / Materials:  Position: Prone w/ head of the table raised (slight reverse trendelenburg) to facilitate breathing.  Prep solution: ChloraPrep (2% chlorhexidine  gluconate and 70% isopropyl alcohol) Prep Area: Entire Posterior Lumbar Region from lower scapular tip down to mid buttocks area and from flank to flank. Materials:  Tray: Epidural tray Needle(s):  Type: Epidural needle (Tuohy) Gauge (G):  22 Length: Regular (3.5-in) Qty: 1  H&P  (Pre-op Assessment):  [name] is a 59 y.o. (year old), male patient, seen today for interventional treatment. He  has a past surgical history that includes Appendectomy; Back surgery; Colonoscopy; Colonoscopy with propofol  (N/A, 07/02/2019); Colonoscopy with propofol  (N/A, 09/17/2019); Endoscopic mucosal resection (N/A, 09/17/2019); Submucosal lifting injection (09/17/2019); Hemostasis clip placement (09/17/2019); IR Radiologist Eval & Mgmt (07/17/2020); IR US  Guide Vasc Access Left (07/24/2020); IR TIB-PERO ART ATHEREC INC PTA MOD SED (07/24/2020); and IR Angiogram Extremity Left (07/24/2020). [name] has a current medication list which includes the following prescription(s): freestyle libre 3 plus sensor, gabapentin , losartan , metformin , ondansetron , tizanidine , and hydrocodone -acetaminophen , and the following Facility-Administered Medications: dexamethasone , iohexol , lactated ringers , lidocaine , midazolam  pf, ropivacaine  (pf) 2 mg/ml (0.2%), and sodium chloride  flush. His primarily concern today is the Back Pain (lower)  Initial Vital Signs:  Pulse/HCG Rate: 82ECG Heart Rate: 80 Temp: (!) 97.2 F (36.2 C) Resp: 18 BP: (!) 142/57 SpO2: 100 %  BMI: Estimated body mass index is 28.59 kg/m as calculated from the following:   Height as of this encounter: 5' 11 (1.803 m).   Weight as of this encounter: 205 lb (93 kg).  Risk Assessment: Allergies: Reviewed. He is allergic to other, ozempic  (0.25 or 0.5 mg-dose) [semaglutide (0.25 or 0.5mg -dos)], and trulicity  [dulaglutide ].  Allergy Precautions: None required Coagulopathies: Reviewed. None identified.  Blood-thinner therapy: None at this time Active Infection(s): Reviewed. None identified. [name] is afebrile  Site Confirmation: [name] was asked to confirm the procedure and laterality before marking the site Procedure checklist: Completed Consent: Before the procedure and under the influence of no sedative(s), amnesic(s), or  anxiolytics, the patient was informed of the treatment options, risks and possible complications. To fulfill our ethical and legal obligations, as recommended by the American Medical  Association's Code of Ethics, I have informed the patient of my clinical impression; the nature and purpose of the treatment or procedure; the risks, benefits, and possible complications of the intervention; the alternatives, including doing nothing; the risk(s) and benefit(s) of the alternative treatment(s) or procedure(s); and the risk(s) and benefit(s) of doing nothing. The patient was provided information about the general risks and possible complications associated with the procedure. These may include, but are not limited to: failure to achieve desired goals, infection, bleeding, organ or nerve damage, allergic reactions, paralysis, and death. In addition, the patient was informed of those risks and complications associated to Spine-related procedures, such as failure to decrease pain; infection (i.e.: Meningitis, epidural or intraspinal abscess); bleeding (i.e.: epidural hematoma, subarachnoid hemorrhage, or any other type of intraspinal or peri-dural bleeding); organ or nerve damage (i.e.: Any type of peripheral nerve, nerve root, or spinal cord injury) with subsequent damage to sensory, motor, and/or autonomic systems, resulting in permanent pain, numbness, and/or weakness of one or several areas of the body; allergic reactions; (i.e.: anaphylactic reaction); and/or death. Furthermore, the patient was informed of those risks and complications associated with the medications. These include, but are not limited to: allergic reactions (i.e.: anaphylactic or anaphylactoid reaction(s)); adrenal axis suppression; blood sugar elevation that in diabetics may result in ketoacidosis or comma; water retention that in patients with history of congestive heart failure may result in shortness of breath, pulmonary edema, and decompensation  with resultant heart failure; weight gain; swelling or edema; medication-induced neural toxicity; particulate matter embolism and blood vessel occlusion with resultant organ, and/or nervous system infarction; and/or aseptic necrosis of one or more joints. Finally, the patient was informed that Medicine is not an exact science; therefore, there is also the possibility of unforeseen or unpredictable risks and/or possible complications that may result in a catastrophic outcome. The patient indicated having understood very clearly. We have given the patient no guarantees and we have made no promises. Enough time was given to the patient to ask questions, all of which were answered to the patient's satisfaction. [name] has indicated that he wanted to continue with the procedure. Attestation: I, the ordering provider, attest that I have discussed with the patient the benefits, risks, side-effects, alternatives, likelihood of achieving goals, and potential problems during recovery for the procedure that I have provided informed consent. Date  Time: 06/06/2024 12:52 PM  Pre-Procedure Preparation:  Monitoring: As per clinic protocol. Respiration, ETCO2, SpO2, BP, heart rate and rhythm monitor placed and checked for adequate function Safety Precautions: Patient was assessed for positional comfort and pressure points before starting the procedure. Time-out: I initiated and conducted the Time-out before starting the procedure, as per protocol. The patient was asked to participate by confirming the accuracy of the Time Out information. Verification of the correct person, site, and procedure were performed and confirmed by me, the nursing staff, and the patient. Time-out conducted as per Joint Commission's Universal Protocol (UP.01.01.01). Time:   Start Time:   hrs.  Description/Narrative of Procedure:          Target: Epidural space via interlaminar opening, initially targeting the lower laminar border of  the superior vertebral body. Region: Lumbar Approach: Percutaneous paravertebral  Rationale (medical necessity): procedure needed and proper for the diagnosis and/or treatment of the patient's medical symptoms and needs. Procedural Technique Safety Precautions: Aspiration looking for blood return was conducted prior to all injections. At no point did we inject any substances, as a needle was being advanced. No  attempts were made at seeking any paresthesias. Safe injection practices and needle disposal techniques used. Medications properly checked for expiration dates. SDV (single dose vial) medications used. Description of the Procedure: Protocol guidelines were followed. The procedure needle was introduced through the skin, ipsilateral to the reported pain, and advanced to the target area. Bone was contacted and the needle walked caudad, until the lamina was cleared. The epidural space was identified using loss-of-resistance technique with 2-3 ml of PF-NaCl (0.9% NSS), in a 5cc LOR glass syringe.  Vitals:   06/06/24 1308 06/06/24 1350  BP: (!) 142/57 (!) 165/71  Pulse: 82   Resp: 18 18  Temp: (!) 97.2 F (36.2 C)   TempSrc: Temporal   SpO2: 100% 100%  Weight: 205 lb (93 kg)   Height: 5' 11 (1.803 m)     Start Time:   hrs. End Time:   hrs.  Imaging Guidance (Spinal):          Type of Imaging Technique: Fluoroscopy Guidance (Spinal) Indication(s): Fluoroscopy guidance for needle placement to enhance accuracy in procedures requiring precise needle localization for targeted delivery of medication in or near specific anatomical locations not easily accessible without such real-time imaging assistance. Exposure Time: Please see nurses notes. Contrast: Before injecting any contrast, we confirmed that the patient did not have an allergy to iodine, shellfish, or radiological contrast. Once satisfactory needle placement was completed at the desired level, radiological contrast was injected.  Contrast injected under live fluoroscopy. No contrast complications. See chart for type and volume of contrast used. Fluoroscopic Guidance: I was personally present during the use of fluoroscopy. Tunnel Vision Technique used to obtain the best possible view of the target area. Parallax error corrected before commencing the procedure. Direction-depth-direction technique used to introduce the needle under continuous pulsed fluoroscopy. Once target was reached, antero-posterior, oblique, and lateral fluoroscopic projection used confirm needle placement in all planes. Images permanently stored in EMR. Interpretation: I personally interpreted the imaging intraoperatively. Adequate needle placement confirmed in multiple planes. Appropriate spread of contrast into desired area was observed. No evidence of afferent or efferent intravascular uptake. No intrathecal or subarachnoid spread observed. Permanent images saved into the patient's record.  Antibiotic Prophylaxis:   Anti-infectives (From admission, onward)    None      Indication(s): None identified  Post-operative Assessment:  Post-procedure Vital Signs:  Pulse/HCG Rate: 8280 Temp: (!) 97.2 F (36.2 C) Resp: 18 BP: (!) 165/71 SpO2: 100 %  EBL: None  Complications: No immediate post-treatment complications observed by team, or reported by patient.  Note: The patient tolerated the entire procedure well. A repeat set of vitals were taken after the procedure and the patient was kept under observation following institutional policy, for this type of procedure. Post-procedural neurological assessment was performed, showing return to baseline, prior to discharge. The patient was provided with post-procedure discharge instructions, including a section on how to identify potential problems. Should any problems arise concerning this procedure, the patient was given instructions to immediately contact us , at any time, without hesitation. In any  case, we plan to contact the patient by telephone for a follow-up status report regarding this interventional procedure.  Comments:  No additional relevant information.  Plan of Care (POC)  Orders:  Orders Placed This Encounter  Procedures   DG PAIN CLIN[facility] C-ARM 1-60 MIN NO REPORT    Intraoperative interpretation by procedural physician at [facility].    Standing Status:   Standing    Number of Occurrences:  1    Reason for exam::   Assistance in needle guidance and placement for procedures requiring needle placement in or near specific anatomical locations not easily accessible without such assistance.     Medications ordered for procedure: Meds ordered this encounter  Medications   iohexol  (OMNIPAQUE ) 180 MG/ML injection 10 mL    Must be Myelogram-compatible. If not available, you may substitute with a water-soluble, non-ionic, hypoallergenic, myelogram-compatible radiological contrast medium.   lidocaine  (XYLOCAINE ) 2 % (with pres) injection 400 mg   lactated ringers  infusion   midazolam  PF (VERSED ) injection 0.5-2 mg    Make sure Flumazenil is available in the pyxis when using this medication. If oversedation occurs, administer 0.2 mg IV over 15 sec. If after 45 sec no response, administer 0.2 mg again over 1 min; may repeat at 1 min intervals; not to exceed 4 doses (1 mg)   ropivacaine  (PF) 2 mg/mL (0.2%) (NAROPIN ) injection 2 mL   sodium chloride  flush (NS) 0.9 % injection 2 mL   dexamethasone  (DECADRON ) injection 10 mg   Medications administered: Wadie L. Radel had no medications administered during this visit.  See the medical record for exact dosing, route, and time of administration.    Right L4-L5 ESI 01/11/2024, 06/06/24    Follow-up plan:   Return in about 4 weeks (around 07/04/2024) for PPE, VV. BL.     Recent Visits Date Type Provider Dept  05/24/24 Office Visit Marcelino Nurse, MD [facility]  Showing recent visits within past 90 days and  meeting all other requirements Today's Visits Date Type Provider Dept  06/06/24 Procedure visit Marcelino Nurse, MD [facility]  Showing today's visits and meeting all other requirements Future Appointments No visits were found meeting these conditions. Showing future appointments within next 90 days and meeting all other requirements   Disposition: Discharge home  Discharge (Date  Time): 06/06/2024;   hrs.   Primary Care Physician: Cleatus Arlyss RAMAN, MD Location: [facility] Note by: Nurse Marcelino, MD (TTS technology used. I apologize for any typographical errors that were not detected and corrected.) Date: 06/06/2024; Time: 1:51 PM  Disclaimer:  Medicine is not an visual merchandiser. The only guarantee in medicine is that nothing is guaranteed. It is important to note that the decision to proceed with this intervention was based on the information collected from the patient. The Data and conclusions were drawn from the patient's questionnaire, the interview, and the physical examination. Because the information was provided in large part by the patient, it cannot be guaranteed that it has not been purposely or unconsciously manipulated. Every effort has been made to obtain as much relevant data as possible for this evaluation. It is important to note that the conclusions that lead to this procedure are derived in large part from the available data. Always take into account that the treatment will also be dependent on availability of resources and existing treatment guidelines, considered by other Pain Management Practitioners as being common knowledge and practice, at the time of the intervention. For Medico-Legal purposes, it is also important to point out that variation in procedural techniques and pharmacological choices are the acceptable norm. The indications, contraindications, technique, and results of the above procedure should only be interpreted and judged by  a Board-Certified Interventional Pain Specialist with extensive familiarity and expertise in the same exact procedure and technique.

## 2024-06-06 NOTE — Progress Notes (Signed)
 PROVIDER NOTE: Interpretation of information contained herein should be left to medically-trained personnel. Specific patient instructions are provided elsewhere under Patient Instructions section of medical record. This document was created in part using STT-dictation technology, any transcriptional errors that may result from this process are unintentional.  Patient: [name] Type: Established DOB: 12-13-1965 MRN: [id number] PCP: Cleatus Arlyss RAMAN, MD  Service: Procedure DOS: 06/06/2024 Setting: Ambulatory Location: Ambulatory outpatient facility Delivery: Face-to-face Provider: Wallie Sherry, MD Specialty: Interventional Pain Management Specialty designation: 09 Location: Outpatient facility Ref. Prov.: Sherry Wallie, MD       Interventional Therapy   Type: Caudal Epidural Steroid Injection  1 Laterality: Midline aiming right Level: Sacrococcygeal ligament  Imaging: Fluoroscopy-guided         Anesthesia: Local anesthesia (1-2% Lidocaine ) Anxiolysis: IV Versed  Analgesia: Fentanyl  DOS: 06/06/2024  Performed by: Wallie Sherry, MD  Purpose: Diagnostic/Therapeutic Indications: Low back and lower extremity pain severe enough to impact quality of life or function. Rationale (medical necessity): procedure needed and proper for the diagnosis and/or treatment of [name] medical symptoms and needs. 1. Spinal stenosis, lumbar region, with neurogenic claudication   2. Chronic radicular lumbar pain   3. Lumbar radiculopathy   4. Diabetic foot ([name])    NAS-11 Pain score:   Pre-procedure: 9 /10   Post-procedure: 0-No pain/10     Target: Lumbosacral epidural canal  Location: Epidural space  Region: Caudal canal Approach: Percutaneous  Type of procedure: Epidural block  Position  Prep  Materials:  Position: Prone  Prep solution: ChloraPrep (2% chlorhexidine  gluconate and 70% isopropyl alcohol) Prep Area: Entire posterior lumbosacral area  Materials:  Tray:  Epidural Needle(s):  Type: Epidural  Gauge (G): 22 Length: 3.5-in  Qty: 1  H&P (Pre-op Assessment):  [name] is a 59 y.o. (year old), male patient, seen today for interventional treatment. He  has a past surgical history that includes Appendectomy; Back surgery; Colonoscopy; Colonoscopy with propofol  (N/A, 07/02/2019); Colonoscopy with propofol  (N/A, 09/17/2019); Endoscopic mucosal resection (N/A, 09/17/2019); Submucosal lifting injection (09/17/2019); Hemostasis clip placement (09/17/2019); IR Radiologist Eval & Mgmt (07/17/2020); IR US  Guide Vasc Access Left (07/24/2020); IR TIB-PERO ART ATHEREC INC PTA MOD SED (07/24/2020); and IR Angiogram Extremity Left (07/24/2020). [name] has a current medication list which includes the following prescription(s): freestyle libre 3 plus sensor, gabapentin , losartan , metformin , ondansetron , tizanidine , and hydrocodone -acetaminophen , and the following Facility-Administered Medications: fentanyl  and lactated ringers . His primarily concern today is the Back Pain (lower)  Initial Vital Signs:  Pulse/HCG Rate: 82ECG Heart Rate: 80 Temp: (!) 97.2 F (36.2 C) Resp: 18 BP: (!) 142/57 SpO2: 100 %  BMI: Estimated body mass index is 28.59 kg/m as calculated from the following:   Height as of this encounter: 5' 11 (1.803 m).   Weight as of this encounter: 205 lb (93 kg).  Risk Assessment: Allergies: Reviewed. He is allergic to other, ozempic  (0.25 or 0.5 mg-dose) [semaglutide (0.25 or 0.5mg -dos)], and trulicity  [dulaglutide ].  Allergy Precautions: None required Coagulopathies: Reviewed. None identified.  Blood-thinner therapy: None at this time Active Infection(s): Reviewed. None identified. [name] is afebrile  Site Confirmation: [name] was asked to confirm the procedure and laterality before marking the site Procedure checklist: Completed Consent: Before the procedure and under the influence of no sedative(s), amnesic(s), or anxiolytics, the  patient was informed of the treatment options, risks and possible complications. To fulfill our ethical and legal obligations, as recommended by the American Medical Association's Code of Ethics, I have informed the patient of my  clinical impression; the nature and purpose of the treatment or procedure; the risks, benefits, and possible complications of the intervention; the alternatives, including doing nothing; the risk(s) and benefit(s) of the alternative treatment(s) or procedure(s); and the risk(s) and benefit(s) of doing nothing. The patient was provided information about the general risks and possible complications associated with the procedure. These may include, but are not limited to: failure to achieve desired goals, infection, bleeding, organ or nerve damage, allergic reactions, paralysis, and death. In addition, the patient was informed of those risks and complications associated to Spine-related procedures, such as failure to decrease pain; infection (i.e.: Meningitis, epidural or intraspinal abscess); bleeding (i.e.: epidural hematoma, subarachnoid hemorrhage, or any other type of intraspinal or peri-dural bleeding); organ or nerve damage (i.e.: Any type of peripheral nerve, nerve root, or spinal cord injury) with subsequent damage to sensory, motor, and/or autonomic systems, resulting in permanent pain, numbness, and/or weakness of one or several areas of the body; allergic reactions; (i.e.: anaphylactic reaction); and/or death. Furthermore, the patient was informed of those risks and complications associated with the medications. These include, but are not limited to: allergic reactions (i.e.: anaphylactic or anaphylactoid reaction(s)); adrenal axis suppression; blood sugar elevation that in diabetics may result in ketoacidosis or comma; water retention that in patients with history of congestive heart failure may result in shortness of breath, pulmonary edema, and decompensation with resultant  heart failure; weight gain; swelling or edema; medication-induced neural toxicity; particulate matter embolism and blood vessel occlusion with resultant organ, and/or nervous system infarction; and/or aseptic necrosis of one or more joints. Finally, the patient was informed that Medicine is not an exact science; therefore, there is also the possibility of unforeseen or unpredictable risks and/or possible complications that may result in a catastrophic outcome. The patient indicated having understood very clearly. We have given the patient no guarantees and we have made no promises. Enough time was given to the patient to ask questions, all of which were answered to the patient's satisfaction. [name] has indicated that he wanted to continue with the procedure. Attestation: I, the ordering provider, attest that I have discussed with the patient the benefits, risks, side-effects, alternatives, likelihood of achieving goals, and potential problems during recovery for the procedure that I have provided informed consent. Date  Time: 06/06/2024 12:52 PM  Imaging Guidance (Spinal):          Type of Imaging Technique: Fluoroscopy Guidance (Spinal) Indication(s): Fluoroscopy guidance for needle placement to enhance accuracy in procedures requiring precise needle localization for targeted delivery of medication in or near specific anatomical locations not easily accessible without such real-time imaging assistance. Exposure Time: Please see nurses notes. Contrast: Before injecting any contrast, we confirmed that the patient did not have an allergy to iodine, shellfish, or radiological contrast. Once satisfactory needle placement was completed at the desired level, radiological contrast was injected. Contrast injected under live fluoroscopy. No contrast complications. See chart for type and volume of contrast used. Fluoroscopic Guidance: I was personally present during the use of fluoroscopy. Tunnel Vision  Technique used to obtain the best possible view of the target area. Parallax error corrected before commencing the procedure. Direction-depth-direction technique used to introduce the needle under continuous pulsed fluoroscopy. Once target was reached, antero-posterior, oblique, and lateral fluoroscopic projection used confirm needle placement in all planes. Images permanently stored in EMR. Interpretation: I personally interpreted the imaging intraoperatively. Adequate needle placement confirmed in multiple planes. Appropriate spread of contrast into desired area was observed. No  evidence of afferent or efferent intravascular uptake. No intrathecal or subarachnoid spread observed. Permanent images saved into the patient's record.  Pre-Procedure Preparation:  Monitoring: As per clinic protocol. Respiration, ETCO2, SpO2, BP, heart rate and rhythm monitor placed and checked for adequate function Safety Precautions: Patient was assessed for positional comfort and pressure points before starting the procedure. Time-out: I initiated and conducted the Time-out before starting the procedure, as per protocol. The patient was asked to participate by confirming the accuracy of the Time Out information. Verification of the correct person, site, and procedure were performed and confirmed by me, the nursing staff, and the patient. Time-out conducted as per Joint Commission's Universal Protocol (UP.01.01.01). Time: 1352 Start Time: 1352 hrs.  Description  Narrative of Procedure:          Start Time: 1352 hrs.  Technical description of procedure:  Safety Precautions: Aspiration looking for blood return was conducted prior to all injections. At no point did we inject any substances, as a needle was being advanced. No attempts were made at seeking any paresthesias. Safe injection practices and needle disposal techniques used. Medications properly checked for expiration dates. SDV (single dose vial)  medications used. Description of the Procedure: Protocol guidelines were followed. The patient was placed in position over the fluoroscopy table. The target area was identified and the area prepped in the usual manner. Skin & deeper tissues infiltrated with local anesthetic. Appropriate amount of time allowed to pass for local anesthetics to take effect. The procedure needle was then advanced to the target area. Proper needle placement secured. Negative aspiration confirmed. Solution injected in intermittent fashion, asking for systemic symptoms every 0.5cc of injectate. The needle/catheter were removed and the area cleansed, making sure to leave some of the prepping solution back to take advantage of its long term bactericidal properties.  Vitals:   06/06/24 1355 06/06/24 1400 06/06/24 1406 06/06/24 1413  BP: (!) 152/74 137/74 124/89 (!) 146/68  Pulse:      Resp: 15 15 18    Temp:      TempSrc:      SpO2: 100% 100% 100% 100%  Weight:      Height:         End Time: 1406 hrs.  Post-operative Assessment:  Post-procedure Vital Signs:  Pulse/HCG Rate: 8282 Temp: (!) 97.2 F (36.2 C) Resp: 18 BP: (!) 146/68 SpO2: 100 %  EBL: None  Complications: No immediate post-treatment complications observed by team, or reported by patient.  Note: The patient tolerated the entire procedure well. A repeat set of vitals were taken after the procedure and the patient was kept under observation following institutional policy, for this type of procedure. Post-procedural neurological assessment was performed, showing return to baseline, prior to discharge. The patient was provided with post-procedure discharge instructions, including a section on how to identify potential problems. Should any problems arise concerning this procedure, the patient was given instructions to immediately contact us , at any time, without hesitation. In any case, we plan to contact the patient by telephone for a follow-up status  report regarding this interventional procedure.  Comments:  No additional relevant information.  Plan of Care (POC)  Orders:  Patient requesting referral to podiatry for his diabetic foot callus, referral placed for Dr. Tobie. Orders Placed This Encounter  Procedures   Ambulatory referral to Podiatry    Referral Priority:   Routine    Referral Type:   Consultation    Referral Reason:   Specialty Services Required  Referred to Provider:   Tobie Franky SQUIBB, DPM    Requested Specialty:   Podiatry    Number of Visits Requested:   1     Medications ordered for procedure: Meds ordered this encounter  Medications   iohexol  (OMNIPAQUE ) 180 MG/ML injection 10 mL    Must be Myelogram-compatible. If not available, you may substitute with a water-soluble, non-ionic, hypoallergenic, myelogram-compatible radiological contrast medium.   lidocaine  (XYLOCAINE ) 2 % (with pres) injection 400 mg   lactated ringers  infusion   midazolam  PF (VERSED ) injection 0.5-2 mg    Make sure Flumazenil is available in the pyxis when using this medication. If oversedation occurs, administer 0.2 mg IV over 15 sec. If after 45 sec no response, administer 0.2 mg again over 1 min; may repeat at 1 min intervals; not to exceed 4 doses (1 mg)   ropivacaine  (PF) 2 mg/mL (0.2%) (NAROPIN ) injection 2 mL   sodium chloride  flush (NS) 0.9 % injection 2 mL   dexamethasone  (DECADRON ) injection 10 mg   fentaNYL  (SUBLIMAZE ) injection 25-50 mcg    Make sure Narcan is available in the pyxis when using this medication. In the event of respiratory depression (RR< 8/min): Titrate NARCAN (naloxone) in increments of 0.1 to 0.2 mg IV at 2-3 minute intervals, until desired degree of reversal.   Medications administered: We administered iohexol , lidocaine , lactated ringers , midazolam  PF, ropivacaine  (PF) 2 mg/mL (0.2%), sodium chloride  flush, dexamethasone , and fentaNYL .  See the medical record for exact dosing, route, and time of  administration.    Right L4-L5 ESI 01/11/2024     Follow-up plan:   Return in about 4 weeks (around 07/04/2024) for PPE, VV. BL.     Recent Visits Date Type Provider Dept  05/24/24 Office Visit Marcelino Nurse, MD [facility]  Showing recent visits within past 90 days and meeting all other requirements Today's Visits Date Type Provider Dept  06/06/24 Procedure visit Marcelino Nurse, MD [facility]  Showing today's visits and meeting all other requirements Future Appointments Date Type Provider Dept  07/03/24 Appointment Marcelino Nurse, MD [facility]  Showing future appointments within next 90 days and meeting all other requirements   Disposition: Discharge home  Discharge (Date  Time): 06/06/2024; 1415 hrs.   Primary Care Physician: Cleatus Arlyss RAMAN, MD Location: [facility] Note by: Nurse Marcelino, MD (TTS technology used. I apologize for any typographical errors that were not detected and corrected.) Date: 06/06/2024; Time: 2:15 PM  Disclaimer:  Medicine is not an visual merchandiser. The only guarantee in medicine is that nothing is guaranteed. It is important to note that the decision to proceed with this intervention was based on the information collected from the patient. The Data and conclusions were drawn from the patient's questionnaire, the interview, and the physical examination. Because the information was provided in large part by the patient, it cannot be guaranteed that it has not been purposely or unconsciously manipulated. Every effort has been made to obtain as much relevant data as possible for this evaluation. It is important to note that the conclusions that lead to this procedure are derived in large part from the available data. Always take into account that the treatment will also be dependent on availability of resources and existing treatment guidelines, considered by other Pain Management Practitioners as being  common knowledge and practice, at the time of the intervention. For Medico-Legal purposes, it is also important to point out that variation in procedural techniques and  pharmacological choices are the acceptable norm. The indications, contraindications, technique, and results of the above procedure should only be interpreted and judged by a Board-Certified Interventional Pain Specialist with extensive familiarity and expertise in the same exact procedure and technique.

## 2024-06-07 ENCOUNTER — Telehealth: Payer: Self-pay

## 2024-06-07 NOTE — Telephone Encounter (Signed)
 PPe  denies any pain at this time. Instructed to call if needed.

## 2024-07-03 ENCOUNTER — Ambulatory Visit: Admitting: Student in an Organized Health Care Education/Training Program
